# Patient Record
Sex: Female | Born: 1940
Health system: Southern US, Community
[De-identification: ages and names within clinical notes are randomized; demographics above are authoritative.]

## PROBLEM LIST (undated history)

## (undated) DIAGNOSIS — I10 Essential (primary) hypertension: Secondary | ICD-10-CM

## (undated) DIAGNOSIS — Z973 Presence of spectacles and contact lenses: Secondary | ICD-10-CM

## (undated) DIAGNOSIS — T7840XA Allergy, unspecified, initial encounter: Secondary | ICD-10-CM

## (undated) DIAGNOSIS — R7303 Prediabetes: Secondary | ICD-10-CM

## (undated) DIAGNOSIS — C50919 Malignant neoplasm of unspecified site of unspecified female breast: Secondary | ICD-10-CM

## (undated) DIAGNOSIS — N189 Chronic kidney disease, unspecified: Secondary | ICD-10-CM

## (undated) DIAGNOSIS — Z923 Personal history of irradiation: Secondary | ICD-10-CM

## (undated) DIAGNOSIS — E860 Dehydration: Secondary | ICD-10-CM

## (undated) HISTORY — PX: COLON SURGERY: SHX602

## (undated) HISTORY — PX: CHOLECYSTECTOMY: SHX55

## (undated) HISTORY — PX: PARTIAL COLECTOMY: SHX5273

## (undated) HISTORY — PX: ABDOMINAL HYSTERECTOMY: SHX81

---

## 1999-06-17 ENCOUNTER — Encounter: Admission: RE | Admit: 1999-06-17 | Discharge: 1999-06-17 | Payer: Self-pay | Admitting: *Deleted

## 1999-06-17 ENCOUNTER — Encounter: Payer: Self-pay | Admitting: *Deleted

## 1999-12-08 ENCOUNTER — Ambulatory Visit (HOSPITAL_COMMUNITY): Admission: RE | Admit: 1999-12-08 | Discharge: 1999-12-08 | Payer: Self-pay | Admitting: Gastroenterology

## 2000-06-17 ENCOUNTER — Encounter: Admission: RE | Admit: 2000-06-17 | Discharge: 2000-06-17 | Payer: Self-pay | Admitting: *Deleted

## 2000-06-17 ENCOUNTER — Encounter: Payer: Self-pay | Admitting: *Deleted

## 2001-02-04 ENCOUNTER — Encounter: Admission: RE | Admit: 2001-02-04 | Discharge: 2001-05-05 | Payer: Self-pay | Admitting: *Deleted

## 2001-06-20 ENCOUNTER — Encounter: Admission: RE | Admit: 2001-06-20 | Discharge: 2001-06-20 | Payer: Self-pay | Admitting: *Deleted

## 2001-06-20 ENCOUNTER — Encounter: Payer: Self-pay | Admitting: *Deleted

## 2002-06-23 ENCOUNTER — Encounter: Payer: Self-pay | Admitting: *Deleted

## 2002-06-23 ENCOUNTER — Encounter: Admission: RE | Admit: 2002-06-23 | Discharge: 2002-06-23 | Payer: Self-pay | Admitting: *Deleted

## 2003-06-27 ENCOUNTER — Encounter: Admission: RE | Admit: 2003-06-27 | Discharge: 2003-06-27 | Payer: Self-pay | Admitting: *Deleted

## 2004-03-16 ENCOUNTER — Inpatient Hospital Stay (HOSPITAL_COMMUNITY): Admission: EM | Admit: 2004-03-16 | Discharge: 2004-03-21 | Payer: Self-pay | Admitting: Emergency Medicine

## 2004-04-15 ENCOUNTER — Encounter: Admission: RE | Admit: 2004-04-15 | Discharge: 2004-04-15 | Payer: Self-pay | Admitting: Surgery

## 2004-05-19 ENCOUNTER — Encounter: Admission: RE | Admit: 2004-05-19 | Discharge: 2004-05-19 | Payer: Self-pay | Admitting: Otolaryngology

## 2004-07-02 ENCOUNTER — Encounter: Admission: RE | Admit: 2004-07-02 | Discharge: 2004-07-02 | Payer: Self-pay | Admitting: *Deleted

## 2004-07-14 ENCOUNTER — Encounter: Admission: RE | Admit: 2004-07-14 | Discharge: 2004-07-14 | Payer: Self-pay | Admitting: *Deleted

## 2004-07-24 ENCOUNTER — Inpatient Hospital Stay (HOSPITAL_COMMUNITY): Admission: RE | Admit: 2004-07-24 | Discharge: 2004-08-01 | Payer: Self-pay | Admitting: Surgery

## 2004-07-24 ENCOUNTER — Encounter (INDEPENDENT_AMBULATORY_CARE_PROVIDER_SITE_OTHER): Payer: Self-pay | Admitting: *Deleted

## 2004-12-31 ENCOUNTER — Encounter: Admission: RE | Admit: 2004-12-31 | Discharge: 2004-12-31 | Payer: Self-pay | Admitting: *Deleted

## 2005-08-20 ENCOUNTER — Encounter: Admission: RE | Admit: 2005-08-20 | Discharge: 2005-08-20 | Payer: Self-pay | Admitting: *Deleted

## 2006-08-04 ENCOUNTER — Encounter: Admission: RE | Admit: 2006-08-04 | Discharge: 2006-08-04 | Payer: Self-pay | Admitting: *Deleted

## 2007-08-10 ENCOUNTER — Encounter: Admission: RE | Admit: 2007-08-10 | Discharge: 2007-08-10 | Payer: Self-pay | Admitting: Family Medicine

## 2007-08-17 ENCOUNTER — Encounter: Admission: RE | Admit: 2007-08-17 | Discharge: 2007-08-17 | Payer: Self-pay | Admitting: Family Medicine

## 2008-04-25 ENCOUNTER — Other Ambulatory Visit: Admission: RE | Admit: 2008-04-25 | Discharge: 2008-04-25 | Payer: Self-pay | Admitting: Family Medicine

## 2008-08-31 ENCOUNTER — Encounter: Admission: RE | Admit: 2008-08-31 | Discharge: 2008-08-31 | Payer: Self-pay | Admitting: Sports Medicine

## 2009-09-04 ENCOUNTER — Encounter: Admission: RE | Admit: 2009-09-04 | Discharge: 2009-09-04 | Payer: Self-pay | Admitting: Family Medicine

## 2010-07-20 ENCOUNTER — Encounter: Payer: Self-pay | Admitting: Family Medicine

## 2010-09-02 ENCOUNTER — Other Ambulatory Visit: Payer: Self-pay | Admitting: Family Medicine

## 2010-09-02 DIAGNOSIS — Z1231 Encounter for screening mammogram for malignant neoplasm of breast: Secondary | ICD-10-CM

## 2010-09-24 ENCOUNTER — Ambulatory Visit
Admission: RE | Admit: 2010-09-24 | Discharge: 2010-09-24 | Disposition: A | Discharge status: Home / Self Care | Payer: Medicare Other | Source: Ambulatory Visit | Attending: Family Medicine | Admitting: Family Medicine

## 2010-09-24 DIAGNOSIS — Z1231 Encounter for screening mammogram for malignant neoplasm of breast: Secondary | ICD-10-CM

## 2010-09-26 ENCOUNTER — Other Ambulatory Visit: Payer: Self-pay | Admitting: Family Medicine

## 2010-09-26 DIAGNOSIS — R928 Other abnormal and inconclusive findings on diagnostic imaging of breast: Secondary | ICD-10-CM

## 2010-10-01 ENCOUNTER — Ambulatory Visit
Admission: RE | Admit: 2010-10-01 | Discharge: 2010-10-01 | Disposition: A | Discharge status: Home / Self Care | Payer: Medicare Other | Source: Ambulatory Visit | Attending: Family Medicine | Admitting: Family Medicine

## 2010-10-01 DIAGNOSIS — R928 Other abnormal and inconclusive findings on diagnostic imaging of breast: Secondary | ICD-10-CM

## 2010-11-14 NOTE — Discharge Summary (Signed)
NAMEWAVE, CALZADA                 ACCOUNT NO.:  [id number]   MEDICAL RECORD NO.:  [id number]          PATIENT TYPE:  INP   LOCATION:  0380                         FACILITY:  [facility]   PHYSICIAN:  Sandria Bales. Ezzard Standing, M.D.  DATE OF BIRTH:  21-Nov-1940   DATE OF ADMISSION:  07/24/2004  DATE OF DISCHARGE:  08/01/2004                                 DISCHARGE SUMMARY   DISCHARGE DIAGNOSES:  1.  Diverticular mass/stricture of sigmoid colon.  2.  Chronic cholecystitis with cholelithiasis.  3.  Hypertension.  4.  Atrophic right kidney.  5.  Degenerative joint disease.  6.  Gastroesophageal reflux disease.   OPERATIONS PERFORMED:  She had a left ureteral stent placed by Dr. Darvin Neighbours  on July 24, 2004 and had a cholecystectomy and intraoperative  cholangiogram and sigmoid colectomy by Dr. Ezzard Standing on July 24, 2004.   HISTORY OF ILLNESS:  This is a 70 year old black female, patient of Dr. Nadine Counts  ___________, who has been seen by Dr. Dorena Cookey, who has a history of  sigmoid diverticular abscess which was treated with a percutaneous drain in  September 2004.  She has now come for admission for a sigmoid colectomy.  I  did a barium enema on May 19, 2004.  This revealed spasm and multiple  diverticular rectosigmoid colon, but no other obvious mass.   PAST MEDICAL HISTORY:  1.  Hypertension.  2.  Atrophic right kidney, for this reason I contacted Dr. Earlene Plater who placed      a left ureteral stent at the time of surgery.  3.  Degenerative joint disease.  4.  Gastroesophageal reflux disease.   HOSPITAL COURSE:  Completed mechanical and antibiotic bowel prep at home  before presenting to the hospital on the day of admission.  She had a left  ureteral stent placed by Dr. Darvin Neighbours and then I did a laparoscopic  cholecystectomy with intraoperative cholangiogram and then an open sigmoid  colectomy.   Postoperatively, she did well.  On the first postoperative day her  hemoglobin was 10.6,  hematocrit 31, white blood count of 10,100.  Sodium of  132, potassium 3.9, chloride of 104.  Her BUN is 9, creatinine 0.9.   Her NG tube was removed on the second postoperative day.  She was kept NPO  since she started passing flatus which was on the fifth postoperative day.  She was started on clear liquids on the sixth postoperative day and her diet  had been advanced to a regular diet.  She is now eight days postoperative.  She is afebrile.  She is eating a regular diet, but she is having some loose  stools.   She is ready for discharge.  Her final pathology showed chronic  cholecystitis with cholelithiasis in the gallbladder and extensive  diverticulosis with focal marked diverticulitis in the sigmoid colon.  There  is no evidence of malignancy.   DISCHARGE INSTRUCTIONS:  Resume home medications which included Lotrel 5/20  one tablet daily, furosemide 40 mg daily, and Nexium 40 mg daily.  She is  given Vicodin for pain.  She will see me back in two weeks.  Her staples  will be removed on discharge with the wound steri-stripped.  She is to call  for any interval problems.  Her discharge condition is good.      DHN/MEDQ  D:  08/01/2004  T:  08/01/2004  Job:  [id number]   cc:   Clearnce Hasten, M.D.   John C. Madilyn Fireman, M.D.  [address]  [place]  [place] [address]  Fax: [phone]   Lucrezia Starch. Ovidio Hanger, M.D.  [address]  [place]  [place] [address]  Fax: [phone]

## 2010-11-14 NOTE — H&P (Signed)
[name], [name]                           ACCOUNT NO.:  [id number]   MEDICAL RECORD NO.:  [id number]                   PATIENT TYPE:  INP   LOCATION:  0482                                 FACILITY:  [facility]   PHYSICIAN:  Candyce Churn, M.D.          DATE OF BIRTH:  06-Dec-1940   DATE OF ADMISSION:  03/16/2004  DATE OF DISCHARGE:                                HISTORY & PHYSICAL   CHIEF COMPLAINT:  Abdominal pain.   HISTORY OF PRESENT ILLNESS:  Ms. [name] is a very pleasant 70 year old  female with a history of (1) hypertension, (2) obesity, (3) atrophic right  kidney, (4) DJD of the left knee, (5) hiatal hernia/GERD, (6) colon polyps,  (7) status post total abdominal hysterectomy.  She presents with 2 days of  abdominal pain both left and right lower quadrant and on CT she has two 2-3  cm abscesses on her sigmoid colon.  She is admitted now for IV antibiotic  therapy.  Dr. Ovidio Kin of surgery has consulted.   MEDICINES INCLUDE:  1.  Lotrel 5/10 one daily.  2.  Lasix 40 mg a day.  3.  Baby aspirin one daily.  4.  Vicodin 5/500 which she takes occasionally for knee pain.   FAMILY HISTORY:  Family history is significant for hypertension and  leukemia; her mother died of old age and her father is still living.   SOCIAL HISTORY:  No tobacco or alcohol use.  Has lived in [place] for the  last 9 years and originally from [place], [place].   REVIEW OF SYSTEMS:  Denies chest pain, headache, dysuria.  She does have  lower abdominal pain.   PHYSICAL EXAMINATION:  GENERAL:  Obese female who is not in any current  distress.  VITAL SIGNS:  Temperature is 98 degrees, blood pressure 135/92, pulse 88 and  regular, respiratory rate is 18 and nonlabored.  HEENT:  Normocephalic.  Pupils are equal and reactive.  Oropharynx is moist.  NECK:  Supple without JVD.  No thyromegaly.  CHEST:  Clear to auscultation.  CARDIAC:  Regular rhythm.  No murmurs or gallops.  No  rubs.  ABDOMEN:  Soft, tender in both lower quadrants, she has got some mild  rebound.  No distention.  Bowel sounds are decreased.  EXTREMITIES:  Without edema.  No cyanosis.  They are warm distally.  Pulses  intact distally.  NEUROLOGIC:  Essentially nonfocal.  She is oriented x3.  SKIN:  She has no rashes.   LABORATORY DATA:  White count 13,400, hemoglobin 12.6, platelet count  366,000, 87% segmented neutrophils on differential.  Sodium 137, potassium  4.2, chloride 103, bicarb 26, BUN 17, creatinine 1.2, glucose is 105.  LFTs  are normal.  Urinalysis is normal.   CT of the abdomen reveals two small abscesses on the sigmoid colon, she has  a gallstone, she has an atrophic right kidney, otherwise benign.  ASSESSMENT:  [age]-year-old female with diverticular abscesses that we  will admit for IV antibiotic  therapy.   PLAN:  1.  IV Cipro and Flagyl.  Keep n.p.o. for now except for ice chips.  2.  Hypertension.  Continue Lotrel and hold aspirin.  3.  Plan percutaneous drainage of the abscesses via CT tomorrow.      RNG/MEDQ  D:  03/16/2004  T:  03/17/2004  Job:  [id number]   cc:   Al Decant. Janey Greaser, MD  [address]  [place]  [place] [address]  Fax: [phone]   Everardo All. Madilyn Fireman, M.D.  [address]  [place]  [place] [address]  Fax: [phone]   Harvie Junior, M.D.  [address]  [place]  [place] [address]  Fax: [phone]

## 2010-11-14 NOTE — Op Note (Signed)
NAMEETOY, MCDONNELL                 ACCOUNT NO.:  [id number]   MEDICAL RECORD NO.:  [id number]          PATIENT TYPE:  INP   LOCATION:  0004                         FACILITY:  [facility]   PHYSICIAN:  Lucrezia Starch. Ovidio Hanger, M.D.DATE OF BIRTH:  03/23/1941   DATE OF PROCEDURE:  07/24/2004  DATE OF DISCHARGE:                                 OPERATIVE REPORT   PREOPERATIVE DIAGNOSES:  1.  Diverticular abscess, healed, now undergoing sigmoid resection by Dr.      Ovidio Kin along with cholecystectomy.  2.  Atrophic right kidney.   POSTOPERATIVE DIAGNOSES:  1.  Diverticular abscess, healed, now undergoing sigmoid resection by Dr.      Ovidio Kin along with cholecystectomy.  2.  Atrophic right kidney.   PROCEDURE:  Cystourethroscopy, left retrograde ureteral pyelogram, placement  of left whistle tip catheter to facilitate surgery.   SURGEON:  Lucrezia Starch. Earlene Plater, M.D.   ANESTHESIA:  General endotracheal.   ESTIMATED BLOOD LOSS:  Negligible.   TUBES:  5 French whistle tip stent Foley catheter.   COMPLICATIONS:  None.   INDICATIONS:  Ms. Jobst is a lovely 70 year old white female who presented  with a diverticular abscess drain.  She is now for a sigmoid colon resection  along with a cholecystectomy.  She has an atrophic right kidney and  essentially solitary left kidney.  Dr. Ezzard Standing felt for resection purposes  especially with the abscess that placement of a stent in the left ureter  preoperatively was indicated.  Understanding the risks, benefits and  alternatives she has elected to proceed.   DESCRIPTION OF PROCEDURE:  The patient was placed in the supine position.  After the proper general endotracheal anesthesia, was placed in the dorsal  lithotomy position and prepped and draped with Betadine in a sterile  fashion.  Cystourethroscopy was performed with a 22.5 [place] Olympus  panendoscope.  It may be noted her urethra was quite tight and urethral  dilatation was performed to 30  [place].  The bladder was inspected with 12  and 70 degree lenses.  Efflux of clear urine was noted from the normally  placed ureteral orifices bilaterally, and there were no lesions in the  bladder and the urethra.  Under fluoroscopic guidance, a 5 French whistle  tip stent was placed in the left renal pelvis.  Dye was injected for  retrograde ureteral pyelogram.  It was a totally normal retrograde ureteral  pyelogram without hydronephrosis, and the stent was passed into the pelvis.  An 22 French Foley catheter  was passed.  The stent was tied to the catheter with 0 vessel sutures and  placed through a Goldberg device into a drainage bag.  More dye was  injected, and on fluoroscopy was noted to be in good condition.  The case  was turned over to [employer] at that point.      RLD/MEDQ  D:  07/24/2004  T:  07/24/2004  Job:  [id number]

## 2010-11-14 NOTE — Discharge Summary (Signed)
NAMEKRISTAN, [name]                 ACCOUNT NO.:  [id number]   MEDICAL RECORD NO.:  [id number]          PATIENT TYPE:  INP   LOCATION:  0482                         FACILITY:  [facility]   PHYSICIAN:  Corinna L. Lendell Caprice, MDDATE OF BIRTH:  03/12/1941   DATE OF ADMISSION:  03/16/2004  DATE OF DISCHARGE:  03/21/2004                                 DISCHARGE SUMMARY   DIAGNOSES:  1.  Diverticular abscess, status post CT-guided drainage.  2.  Hypertension.  3.  Atrophic right kidney.  4.  Degenerative joint disease.  5.  Gastroesophageal reflux disease.   DISCHARGE MEDICATIONS:  1.  Ciprofloxacin 500 mg p.o. b.i.d. x 6 more days.  2.  Flagyl 500 mg p.o. t.i.d. x 6 more days.  3.  She may take Tylenol or Vicodin p.r.n. pain.  4.  Continue her outpatient medications which include:  Lasix 40 mg daily.  5.  Lotrel 5/10 mg daily.  6.  Baby aspirin daily.   ACTIVITY:  Ad lib.   DIET:  No seeds.   FOLLOW UP:  With Dr. Ezzard Standing in 2 weeks.   CONDITION AT DISCHARGE:  Stable.   PROCEDURES:  CT-guided drainage of diverticular abscess.   CONSULTATIONS:  Dr. Ezzard Standing   PERTINENT LABORATORY DATA:  Culture of the abscess grew out moderate E. coli  which was pansensitive.  White count on admission was 13,000 with 87%  neutrophils, 7% lymphocytes.  Otherwise, unremarkable.  At discharge, her  white count had decreased to within normal limits.  H&H was stable.  Complete metabolic panel essentially unremarkable.  UA essentially negative.   SPECIAL STUDIES AND RADIOLOGY:  CT of the abdomen and pelvis showed sigmoid  diverticulitis with two abscesses adjacent, measuring 27 x 24 mm and 17 x 24  mm.  No free air.   HISTORY AND HOSPITAL COURSE:  [name] is a pleasant 70 year old white  female patient of Dr. Doran Clay, who presented to the emergency room  with severe abdominal pain.  Her temperature was normal, and she had  otherwise normal vital signs.  She had a soft abdomen, was tender in both  lower quadrants with some mild rebound tenderness, diminished bowel sounds.  She was found to have a diverticular abscess.  Surgery was  consulted and recommended CT-guided drainage which occurred with  interventional radiology.  The patient was started on Cipro and Flagyl IV,  made NPO.  She was given pain medications and antiemetics as needed.  Her  pain and fevers subsided and, at the time of discharge, she was tolerating a  diet, afebrile, and had no tenderness on exam.     Cori   CLS/MEDQ  D:  03/21/2004  T:  03/22/2004  Job:  [id number]   cc:   Al Decant. Janey Greaser, MD  [address]  [place]  [place] [address]  Fax: [phone]   Sandria Bales. Ezzard Standing, M.D.  [address]  [place]  [place] [address]  Fax: [phone]

## 2010-11-14 NOTE — Consult Note (Signed)
NAMECECILLE, [name]                 ACCOUNT NO.:  [id number]   MEDICAL RECORD NO.:  [id number]          PATIENT TYPE:  EMS   LOCATION:  ED                           FACILITY:  [facility]   PHYSICIAN:  Sandria Bales. Ezzard Standing, M.D.  DATE OF BIRTH:  04/04/1941   DATE OF CONSULTATION:  03/16/2004  DATE OF DISCHARGE:                                   CONSULTATION   GENERAL SURGERY CONSULTATION:   HISTORY OF PRESENT ILLNESS:  This is a 70 year old black female, this is a  patient of Dr. Karie Chimera, who was doing fairly well until Friday,  March 14, 2004 when she had increasing lower abdominal pain.  Because of  the severe pain, she went to the [facility] today.  They saw a  physician whose name they cannot remember, she was then sent to [facility]  Emergency Room, at [facility] Emergency Room they obtained a CT scan which  showed diverticular abscess of the sigmoid colon and I was consulted.   PAST MEDICAL HISTORY:  The patient has no prior history of peptic ulcer  disease, liver disease, colon disease.  She apparently had a bout with  diverticular disease about 1996 but that was a single event treated as an  outpatient without hospitalization.  She said she had a negative colonoscopy  by Dr. Dorena Cookey in about 2003.  Her only prior abdominal surgery was a  hysterectomy in 1975.   ALLERGIES:  She has no allergies.   CURRENT MEDICATIONS:  Her current medications include Lotrel, Lasix, baby  aspirin.   REVIEW OF SYSTEMS:  NEUROLOGIC:  No seizure or loss of consciousness.  PULMONARY:  Does not smoke cigarettes.  No history of pneumonia or  tuberculosis.  CARDIAC:  She has been hypertensive for approximately 9-10  years but she thinks it is well controlled on her blood pressure medicine.  She never had a heart attack or chest pain.  GASTROINTESTINAL:  See history of present illness.  UROLOGIC:  She knows she has one kidney which does not function or does not  appear normal.  She  says she was told this in [place] many years ago.  The  reason for it is unclear.  She has never had any other kidney problems such  as stones or infections.  GYNECOLOGIC:  She had a hysterectomy for __________  reasons in 1975.  She  states they were fibroid tumors.  She has two children.  Her daughter  [name] is in the room with her.  She works part time for the [employer] on [address].  Her husband is traveling back from the [employer].  MUSCULOSKELETAL:  She saw Dr. Jodi Geralds after straining her left knee, had  an injection just 2 weeks ago and she is on some pain medicines for this.   PHYSICAL EXAMINATION:  VITAL SIGNS:  Her temperature is 98, blood pressure  135/92, pulse 88, respirations 16.  GENERAL:  She is a well-nourished pleasant black female, alert and  cooperative.  HEENT:  Unremarkable.  NECK:  Supple, she has no mass,  no thyromegaly.  LUNGS:  Clear to auscultation with symmetric breath sounds.  HEART:  Her heart has a regular rate and rhythm without murmur or rub.  ABDOMEN:  She has some mild to moderate lower suprapubic tenderness.  She  has no guarding, no rebound.  I feel no abdominal masses.  She is a little  bit on the obese side.  RECTAL:  I did not do a rectal exam on her.  EXTREMITIES:  She has good strength in the upper and lower extremities.  NEUROLOGIC:  Grossly intact.   LABORATORIES:  Labs that I have show a white blood count of 13,400,  hemoglobin 12.6, hematocrit 38.5, platelet count 366,000.  Her sodium is  137, potassium 4.2, chloride of 103, CO2 of 26, glucose of 105, BUN of 17,  creatinine of 1.2.   I reviewed a CT scan with Anselmo Pickler.  The CT/chest x-ray show four  things; (1) she has a moderate-sized hiatal hernia, (2) she has a large  gallstone with probably other gallbladder disease, (3) she has an atrophic  right kidney, (4) she has a sigmoid colon diverticulitis with a diverticular  abscess about 2-3 cm in size actually  kind of dumbbell shaped two of them  side by side with an air-fluid level, she has no pneumoperitoneum or diffuse  infection.   IMPRESSION:  1.  Diverticulitis with abscess.   1.  Gallstone   PLAN:  IV antibiotics, n.p.o., she is being seen by Dr. Johnella Moloney who is  the [facility] covering for Dr. Janey Greaser and we will follow with them.  Plan either aspiration or catheter drainage tomorrow in radiology.  Hopefully we can resolve this abscess and would elect 6-12 weeks from now an  elective sigmoid colectomy with primary anastomosis if she needs emergent  surgery.  She understands she may very well end up with a colostomy.  This  is explained to the patient and her daughter.   Second, she has a large gallstone, certainly at risk for impaction and if  she comes to elective surgery I would plan to do her cholecystectomy at the  same time probably.   1.  she has atrophic right kidney for unknown reasons probably very remote.   1.  There is that of a large hiatal hernia which has been asymptomatic.   1.  hypertension.   1.  recent left knee injury for which she is doing pretty well.     Davi   DHN/MEDQ  D:  03/16/2004  T:  03/16/2004  Job:  [id number]   cc:   Al Decant. Janey Greaser, MD  [address]  [place]  [place] [address]  Fax: [phone]   Candyce Churn, M.D.  [address] [place]  [place] [address]  Fax: [phone]   Harvie Junior, M.D.  [address]  [place]  [place] [address]  Fax: [phone]   Everardo All. Madilyn Fireman, M.D.  [address]  [place]  [place] [address]  Fax: [phone]

## 2010-11-14 NOTE — Op Note (Signed)
[name], [name]                 ACCOUNT NO.:  [id number]   MEDICAL RECORD NO.:  [id number]          PATIENT TYPE:  INP   LOCATION:  0004                         FACILITY:  [facility]   PHYSICIAN:  Sandria Bales. Ezzard Standing, M.D.  DATE OF BIRTH:  December 04, 1940   DATE OF PROCEDURE:  07/24/2004  DATE OF DISCHARGE:                                 OPERATIVE REPORT   PREOPERATIVE DIAGNOSIS:  History of diverticular abscess of sigmoid colon,  chronic cholecystitis and cholelithiasis, atrophic right kidney.   POSTOPERATIVE DIAGNOSIS:  History of diverticular abscess with inflammatory  changes of the distal sigmoid colon, chronic cholecystitis and  cholelithiasis, atrophic right kidney.   OPERATION PERFORMED:  1.  Left ureteral stent placement by Windy Fast L. Earlene Plater, M.D.  He will dictate      this portion of the operation.  2.  Laparoscopic cholecystectomy with intraoperative cholangiogram and      sigmoid colectomy.   SURGEON:  Sandria Bales. Ezzard Standing, M.D.   ASSISTANT:  Angelia Mould. Derrell Lolling, M.D.   ANESTHESIA:  General endotracheal.   ESTIMATED BLOOD LOSS:  350 mL.   DRAINS:  None.   INDICATIONS FOR PROCEDURE:  Ms. Valdivia is a 70 year old black female who was  hospitalized in September of 2005 with a diverticular abscess which was  treated with percutaneous drainage.   The patient by CT scan was also noted to have gallstones at that time.  She  now comes for attempted sigmoid colectomy and cholecystectomy.  She also has  an atrophic right kidney.  Since her renal function is dependent on one  kidney, I asked Dr. Darvin Neighbours to see her about placing a stent  preoperatively.  I was not sure exactly how much of the inflammatory  component would remain with the sigmoid colon.   The indications and potential complications of both the cholecystectomy and  colectomy were explained to the patient.  Potential complications include  but not limited to bleeding, infection, bile duct injury, bowel injury,  leak,  possibility of hernia.   DESCRIPTION OF PROCEDURE:  The patient was placed in a supine position after  Dr. Earlene Plater completed his left ureteral stent placement in the lithotomy  position.  Her abdomen was prepped with Betadine solution and sterilely  draped.  I first did the gallbladder surgery.   I went through an infraumbilical incision and placed a 10 mm Hasson trocar.  Three trocars were placed.  A 10 mm subxiphoid trocar, a 5 mm right mid  subcostal, and a 5 mm lateral subcostal trocar.  The gallbladder was grasped  and rotated cephalad.  There was noted to be quite extensive adhesions  between almost the whole body of the gallbladder to tissues lateral to the  duodenum and actually attached to the duodenum.  These were taken down  sharply and bluntly.  There was no obvious injury to either the duodenum or  the paraduodenal area.   The cystic duct and cystic artery were then both identified and the cystic  artery triply endoclipped and divided.  The cystic duct a single clip placed  across it and  then I shot an intraoperative cholangiogram.  The  intraoperative cholangiogram was shot using a cut off taut catheter inserted  through a 14 gauge Jelco into the abdominal cavity into the site of the cut  cystic duct.  Intraoperative cholangiogram was shot using 6 mL of half  strength Hypaque solution.  This showed free flow of contrast down the  cystic duct, into the common bile duct into the duodenum and back up the  hepatic radicals.  This was felt to be a normal intraoperative  cholangiogram.   The taut catheter was then removed.  The cystic duct triply endoclipped and  divided.  The gallbladder was sharply and bluntly dissected from the  gallbladder bed using primarily the hook Bovie for coagulation.  At  completion of the dissection, the gallbladder bed was visualized and there  was no bleeding or bile leak.  The triangle of Calot was visualized.  There  was no bleeding or bile leak.   The gallbladder was then delivered through an  EndoCatch back and delivered through the umbilicus.  Again, I inspected the  gallbladder bed, dissection, there was no active bleeding.  Then I turned my  attention to the sigmoid colon.   A low midline incision was made with sharp dissection carried down to the  abdominal cavity.  I was a little hesitant to do this laparoscopically for  two reasons.  (1) She did have a single ureter which I really had to make  sure was out of the way.  (2)  She had an abscess which was a pretty good-  sized abscess which had been percutaneously drained.  I was unclear a little  bit about how things would be stuck.   So I went through a low midline incision, identified the sigmoid colon.  I  freed up the sigmoid colon with its reflection along the line of Toldt.  She  did have a fair amount of adhesions to her left ovary and fallopian tube but  her uterus was absent and this was freed up without incident after the  vessels had been identified.   Again, the patient had kind of a bulky, post inflammatory mass in the distal  one third of the sigmoid colon.  I got below this, immediately above the  peritoneal reflection where I thought I would do my anastomosis.  I went up  and divided the sigmoid colon at the mid sigmoid colon.  I took the  mesentery down excising the sigmoid mass.  I placed a stitch on the proximal  end  of the resected sigmoid colon and sent it to pathology.  Dr. Kieth Brightly called me back and said there was no obvious mucosal defect and it was  felt to be all inflammatory changes secondary to diverticulits.   I then carried out an anastomosis using an interrupted 2-0 silk suture in an  inverting fashion until I got to the anterior wall and I then did a Gambee  suture.  The anastomosis easily held my thumb if not wider.  There was no  bleeding from the anastomosis.  I closed the mesentery with a 2-0 Vicryl suture.  I irrigated the  abdomen out with about 4L of saline.  I then  returned the bowel to its normal location, retrieved the packs, placed the  omentum down in the lower pelvis.  I closed the anterior fascia with two  running #1 PDS sutures.   I irrigated.  I then closed the fascia  with two running #1 PDS sutures.  I  closed the skin with skin staples, placed Telfa wicks in the wound.  Her  stent was removed at the end of the procedure but I did leave the Foley  catheter in place.  She had a nasogastric tube in place.  The estimated  blood loss for this procedure was about 350 mL.   The patient tolerated the procedure well and was transported to the recovery  room in good condition.  Sponge and needle counts were correct at the end of  this case.      DHN/MEDQ  D:  07/24/2004  T:  07/24/2004  Job:  [id number]   cc:   Al Decant. Janey Greaser, MD  [address]  [place]  [place] [address]  Fax: [phone]

## 2010-11-14 NOTE — Op Note (Signed)
[facility]  Patient:    [name], FIALLOS                        MRN: [id number] Proc. Date: 12/08/99 Adm. Date:  [id number] Disc. Date: [id number] Attending:  Louie Bun CC:         Al Decant. Janey Greaser, M.D.                           Operative Report  PROCEDURE:  Colonoscopy.  INDICATIONS:  History of adenomatous colon polyps on initial colonoscopy three years ago, due for surveillance.  DESCRIPTION OF PROCEDURE:  The patient was placed in the left lateral decubitus  position and placed on the pulse monitor with continuous low flow oxygen delivered by nasal cannula.  She was sedated with 60 mg IV Demerol and 6 mg IV Versed. The Olympus video colonoscope was inserted into the rectum and advanced to the cecum, confirmed by transillumination of McBurneys point and visualization of the ileocecal valve and appendiceal orifice, as well as intubation of the terminal ileum which appeared normal.  The prep was generally good, but suboptimal in the ascending colon and cecum requiring a significant amount of water lavage and despite this, I could not conclusively rule out small lesions less than 1 cm in all areas there.  Otherwise, the cecum, ascending, transverse, and descending colon  appeared normal with no masses, polyps, diverticuli, or other mucosal abnormalities.  In the sigmoid colon, there were seen some scattered diverticuli and no other abnormalities.  The rectum appeared normal and retroflexed view of the anus did reveal some small internal hemorrhoids.  The colonoscope was then withdrawn and the patient returned to the recovery room in stable condition. She tolerated the procedure well and there were no immediate complications.  IMPRESSION: 1. Left-sided diverticulosis. 2. Internal hemorrhoids.  PLAN:  Repeat colonoscopy in five years based on her prior history of adenomas. DD:  12/08/99 TD:  12/10/99 Job: [id number]

## 2011-03-10 ENCOUNTER — Other Ambulatory Visit: Payer: Self-pay | Admitting: Family Medicine

## 2011-03-10 DIAGNOSIS — N63 Unspecified lump in unspecified breast: Secondary | ICD-10-CM

## 2011-04-03 ENCOUNTER — Ambulatory Visit
Admission: RE | Admit: 2011-04-03 | Discharge: 2011-04-03 | Disposition: A | Discharge status: Home / Self Care | Payer: Medicare Other | Source: Ambulatory Visit | Attending: Family Medicine | Admitting: Family Medicine

## 2011-04-03 ENCOUNTER — Other Ambulatory Visit: Payer: Self-pay | Admitting: Family Medicine

## 2011-04-03 DIAGNOSIS — N63 Unspecified lump in unspecified breast: Secondary | ICD-10-CM

## 2011-09-16 ENCOUNTER — Other Ambulatory Visit: Payer: Self-pay | Admitting: Family Medicine

## 2011-09-16 DIAGNOSIS — Z1231 Encounter for screening mammogram for malignant neoplasm of breast: Secondary | ICD-10-CM

## 2011-10-21 ENCOUNTER — Ambulatory Visit
Admission: RE | Admit: 2011-10-21 | Discharge: 2011-10-21 | Disposition: A | Discharge status: Home / Self Care | Payer: Medicare Other | Source: Ambulatory Visit | Attending: Family Medicine | Admitting: Family Medicine

## 2011-10-21 DIAGNOSIS — Z1231 Encounter for screening mammogram for malignant neoplasm of breast: Secondary | ICD-10-CM

## 2012-09-30 ENCOUNTER — Other Ambulatory Visit: Payer: Self-pay

## 2012-09-30 DIAGNOSIS — Z1231 Encounter for screening mammogram for malignant neoplasm of breast: Secondary | ICD-10-CM

## 2012-10-27 ENCOUNTER — Ambulatory Visit
Admission: RE | Admit: 2012-10-27 | Discharge: 2012-10-27 | Disposition: A | Discharge status: Home / Self Care | Payer: Medicare Other | Source: Ambulatory Visit

## 2012-10-27 DIAGNOSIS — Z1231 Encounter for screening mammogram for malignant neoplasm of breast: Secondary | ICD-10-CM

## 2013-07-20 ENCOUNTER — Other Ambulatory Visit (HOSPITAL_COMMUNITY): Payer: Self-pay | Admitting: Internal Medicine

## 2013-07-20 DIAGNOSIS — R7989 Other specified abnormal findings of blood chemistry: Secondary | ICD-10-CM

## 2013-08-08 ENCOUNTER — Encounter (HOSPITAL_COMMUNITY)
Admission: RE | Admit: 2013-08-08 | Discharge: 2013-08-08 | Disposition: A | Discharge status: Home / Self Care | Payer: Medicare Other | Source: Ambulatory Visit | Attending: Internal Medicine | Admitting: Internal Medicine

## 2013-08-08 DIAGNOSIS — R946 Abnormal results of thyroid function studies: Secondary | ICD-10-CM | POA: Insufficient documentation

## 2013-08-08 DIAGNOSIS — R7989 Other specified abnormal findings of blood chemistry: Secondary | ICD-10-CM

## 2013-08-09 ENCOUNTER — Ambulatory Visit (HOSPITAL_COMMUNITY)
Admission: RE | Admit: 2013-08-09 | Discharge: 2013-08-09 | Disposition: A | Discharge status: Home / Self Care | Payer: Medicare Other | Source: Ambulatory Visit | Attending: Internal Medicine | Admitting: Internal Medicine

## 2013-08-09 DIAGNOSIS — E349 Endocrine disorder, unspecified: Secondary | ICD-10-CM | POA: Insufficient documentation

## 2013-08-09 MED ORDER — SODIUM PERTECHNETATE TC 99M INJECTION
10.5000 | Freq: Once | INTRAVENOUS | Status: AC | PRN
  Administered 2013-08-09: 11 via INTRAVENOUS

## 2013-08-09 MED ORDER — SODIUM IODIDE I 131 CAPSULE
16.5000 | Freq: Once | INTRAVENOUS | Status: AC | PRN
  Administered 2013-08-09: 16.5 via ORAL

## 2013-10-23 ENCOUNTER — Other Ambulatory Visit: Payer: Self-pay

## 2013-10-23 DIAGNOSIS — Z1231 Encounter for screening mammogram for malignant neoplasm of breast: Secondary | ICD-10-CM

## 2013-11-09 ENCOUNTER — Ambulatory Visit
Admission: RE | Admit: 2013-11-09 | Discharge: 2013-11-09 | Disposition: A | Discharge status: Home / Self Care | Payer: Medicare Other | Source: Ambulatory Visit

## 2013-11-09 ENCOUNTER — Encounter (INDEPENDENT_AMBULATORY_CARE_PROVIDER_SITE_OTHER): Payer: Self-pay

## 2013-11-09 DIAGNOSIS — Z1231 Encounter for screening mammogram for malignant neoplasm of breast: Secondary | ICD-10-CM

## 2014-03-03 ENCOUNTER — Emergency Department (HOSPITAL_COMMUNITY): Payer: Medicare Other

## 2014-03-03 ENCOUNTER — Encounter (HOSPITAL_COMMUNITY): Payer: Self-pay | Admitting: Emergency Medicine

## 2014-03-03 ENCOUNTER — Emergency Department (HOSPITAL_COMMUNITY)
Admission: EM | Admit: 2014-03-03 | Discharge: 2014-03-03 | Disposition: A | Discharge status: Home / Self Care | Payer: Medicare Other | Attending: Emergency Medicine | Admitting: Emergency Medicine

## 2014-03-03 DIAGNOSIS — R0789 Other chest pain: Secondary | ICD-10-CM | POA: Insufficient documentation

## 2014-03-03 DIAGNOSIS — Z7982 Long term (current) use of aspirin: Secondary | ICD-10-CM | POA: Diagnosis not present

## 2014-03-03 DIAGNOSIS — Z79899 Other long term (current) drug therapy: Secondary | ICD-10-CM | POA: Insufficient documentation

## 2014-03-03 DIAGNOSIS — R079 Chest pain, unspecified: Secondary | ICD-10-CM | POA: Diagnosis present

## 2014-03-03 DIAGNOSIS — I10 Essential (primary) hypertension: Secondary | ICD-10-CM | POA: Diagnosis not present

## 2014-03-03 DIAGNOSIS — R1013 Epigastric pain: Secondary | ICD-10-CM | POA: Insufficient documentation

## 2014-03-03 HISTORY — DX: Essential (primary) hypertension: I10

## 2014-03-03 LAB — COMPREHENSIVE METABOLIC PANEL
ALT: 15 U/L (ref 0–35)
ANION GAP: 11 (ref 5–15)
AST: 20 U/L (ref 0–37)
Albumin: 3.2 g/dL — ABNORMAL LOW (ref 3.5–5.2)
Alkaline Phosphatase: 68 U/L (ref 39–117)
BILIRUBIN TOTAL: 0.3 mg/dL (ref 0.3–1.2)
BUN: 14 mg/dL (ref 6–23)
CHLORIDE: 103 meq/L (ref 96–112)
CO2: 26 meq/L (ref 19–32)
CREATININE: 1.19 mg/dL — AB (ref 0.50–1.10)
Calcium: 9.3 mg/dL (ref 8.4–10.5)
GFR, EST AFRICAN AMERICAN: 52 mL/min — AB (ref >90)
GFR, EST NON AFRICAN AMERICAN: 44 mL/min — AB (ref >90)
GLUCOSE: 113 mg/dL — AB (ref 70–99)
Potassium: 4 mEq/L (ref 3.7–5.3)
Sodium: 140 mEq/L (ref 137–147)
Total Protein: 7.1 g/dL (ref 6.0–8.3)

## 2014-03-03 LAB — CBC WITH DIFFERENTIAL/PLATELET
Basophils Absolute: 0 10*3/uL (ref 0.0–0.1)
Basophils Relative: 0 % (ref 0–1)
EOS PCT: 3 % (ref 0–5)
Eosinophils Absolute: 0.2 10*3/uL (ref 0.0–0.7)
HEMATOCRIT: 37.3 % (ref 36.0–46.0)
HEMOGLOBIN: 12.6 g/dL (ref 12.0–15.0)
LYMPHS ABS: 1.7 10*3/uL (ref 0.7–4.0)
LYMPHS PCT: 25 % (ref 12–46)
MCH: 29 pg (ref 26.0–34.0)
MCHC: 33.8 g/dL (ref 30.0–36.0)
MCV: 85.7 fL (ref 78.0–100.0)
MONO ABS: 0.6 10*3/uL (ref 0.1–1.0)
MONOS PCT: 8 % (ref 3–12)
NEUTROS ABS: 4.5 10*3/uL (ref 1.7–7.7)
Neutrophils Relative %: 64 % (ref 43–77)
Platelets: 340 10*3/uL (ref 150–400)
RBC: 4.35 MIL/uL (ref 3.87–5.11)
RDW: 14.1 % (ref 11.5–15.5)
WBC: 7 10*3/uL (ref 4.0–10.5)

## 2014-03-03 LAB — I-STAT TROPONIN, ED: Troponin i, poc: 0 ng/mL (ref 0.00–0.08)

## 2014-03-03 LAB — LIPASE, BLOOD: LIPASE: 118 U/L — AB (ref 11–59)

## 2014-03-03 MED ORDER — OMEPRAZOLE 20 MG PO CPDR: 20.0000 mg | DELAYED_RELEASE_CAPSULE | Freq: Two times a day (BID) | ORAL | Status: DC

## 2014-03-03 MED ORDER — MORPHINE SULFATE 4 MG/ML IJ SOLN
4.0000 mg | Freq: Once | INTRAMUSCULAR | Status: AC
  Administered 2014-03-03: 4 mg via INTRAVENOUS
  Filled 2014-03-03: qty 1

## 2014-03-03 MED ORDER — GI COCKTAIL ~~LOC~~
30.0000 mL | Freq: Once | ORAL | Status: AC
  Administered 2014-03-03: 30 mL via ORAL
  Filled 2014-03-03: qty 30

## 2014-03-03 MED ORDER — HYDROCODONE-ACETAMINOPHEN 5-325 MG PO TABS: 2.0000 | ORAL_TABLET | ORAL | Status: DC | PRN

## 2014-03-03 MED ORDER — IOHEXOL 300 MG/ML  SOLN
50.0000 mL | Freq: Once | INTRAMUSCULAR | Status: AC | PRN
  Administered 2014-03-03: 50 mL via ORAL

## 2014-03-03 MED ORDER — IOHEXOL 300 MG/ML  SOLN
100.0000 mL | Freq: Once | INTRAMUSCULAR | Status: AC | PRN
  Administered 2014-03-03: 100 mL via INTRAVENOUS

## 2014-03-03 NOTE — ED Provider Notes (Addendum)
CSN: [id number]     Arrival date & time 03/03/14  0035 History   First MD Initiated Contact with Patient 03/03/14 0050     Chief Complaint  Patient presents with  . Chest Pain     (Consider location/radiation/quality/duration/timing/severity/associated sxs/prior Treatment) HPI Comments: Patient is a 73 year old female with past medical history of hypertension and cholecystectomy. She presents today with complaints of sharp pains in her lower chest that started today at approximately 1:30. She describes these as sharp and stabbing and are worsened with movement, palpation, and position. It is relieved with remaining still. She denies any nausea, diaphoresis, shortness of breath, or radiation to the arm or talk. She denies any recent exertional symptoms. There is no relation to food.  Patient is a 73 y.o. female presenting with chest pain. The history is provided by the patient.  Chest Pain Pain location:  Substernal area Pain quality: sharp   Pain radiates to:  Does not radiate Pain radiates to the back: no   Pain severity:  Moderate Onset quality:  Sudden Duration:  12 hours Timing:  Constant Progression:  Worsening Chronicity:  New Context: breathing and movement   Relieved by: Remaining still. Worsened by:  Coughing, deep breathing, certain positions and movement (Palpation) Associated symptoms: no cough, no nausea and no shortness of breath     Past Medical History  Diagnosis Date  . Hypertension    Past Surgical History  Procedure Laterality Date  . Cholecystectomy    . Colon surgery    . Abdominal hysterectomy     No family history on file. History  Substance Use Topics  . Smoking status: Never Smoker   . Smokeless tobacco: Not on file  . Alcohol Use: No   OB History   Grav Para Term Preterm Abortions TAB SAB Ect Mult Living                 Review of Systems  Respiratory: Negative for cough and shortness of breath.   Cardiovascular: Positive for chest pain.   Gastrointestinal: Negative for nausea.  All other systems reviewed and are negative.     Allergies  Review of patient's allergies indicates no known allergies.  Home Medications   Prior to Admission medications   Medication Sig Start Date End Date Taking? Authorizing Provider  amLODipine-benazepril (LOTREL) 5-20 MG per capsule Take 1 capsule by mouth daily.   Yes Historical Provider, MD  aspirin EC 81 MG tablet Take 81 mg by mouth daily.   Yes Historical Provider, MD  furosemide (LASIX) 40 MG tablet Take 40 mg by mouth.   Yes Historical Provider, MD   BP 143/83  Pulse 73  Temp(Src) 98.4 F (36.9 C) (Oral)  Resp 19  SpO2 98% Physical Exam  Nursing note and vitals reviewed. Constitutional: She is oriented to person, place, and time. She appears well-developed and well-nourished. No distress.  HENT:  Head: Normocephalic and atraumatic.  Neck: Normal range of motion. Neck supple.  Cardiovascular: Normal rate and regular rhythm.  Exam reveals no gallop and no friction rub.   No murmur heard. Pulmonary/Chest: Effort normal and breath sounds normal. No respiratory distress. She has no wheezes. She exhibits tenderness.  There is exquisite tenderness of the lower sternum to palpation.  Abdominal: Soft. Bowel sounds are normal. She exhibits no distension. There is no tenderness.  Musculoskeletal: Normal range of motion. She exhibits no edema.  Neurological: She is alert and oriented to person, place, and time.  Skin: Skin is warm  and dry. She is not diaphoretic.    ED Course  Procedures (including critical care time) Labs Review Labs Reviewed  CBC WITH DIFFERENTIAL  COMPREHENSIVE METABOLIC PANEL  LIPASE, BLOOD  I-STAT [place], ED    Imaging Review No results found.   EKG Interpretation   Date/Time:  Saturday March 03 2014 00:48:21 EDT Ventricular Rate:  72 PR Interval:  161 QRS Duration: 82 QT Interval:  465 QTC Calculation: 509 R Axis:   65 Text  Interpretation:  Sinus rhythm Borderline T abnormalities, diffuse  leads Prolonged QT interval Baseline wander in lead(s) II III aVL aVF V6  Confirmed by DELOS  MD, Max Nuno ([id number]) on 03/03/2014 12:59:55 AM      MDM   Final diagnoses:  None    Patient presents here with complaints of epigastric and lower chest discomfort. This is been going on for approximately 12 hours. Workup reveals no change in EKG and her troponin is unremarkable. Laboratory studies do reveal a mildly elevated lipase of 118. This raises my concerns for pancreatitis and this result was followed up with a CT scan. This reveals a Bochdalek's hernia, however no evidence for pancreatitis. I am uncertain as to whether her pain is caused by the hernia or potentially related to pancreatitis that has not shown up on the CT scan as of yet. Either way, she has not had significant relief with morphine x2 and I feel as though admission for observation is appropriate. I strongly doubt a cardiac etiology as there is no elevation of troponin and no significant EKG findings.  I've spoken with Dr. Roel Cluck who will evaluate the patient in the ER.    Veryl Speak, MD 03/03/14 [phone]    Patient now saying that her pain is completely resolved and desires to go home. She will be given pain medication, started on Prilosec and advised to return if she develops any problems.  Veryl Speak, MD 03/03/14 [phone]

## 2014-03-03 NOTE — ED Notes (Signed)
Pt presents with CP under L breast onset @ 1330 today. Worse with movement, denies [facility], denies n/v/d. Pt A & O, NAD

## 2014-03-03 NOTE — Discharge Instructions (Signed)
Prilosec twice daily as prescribed. Hydrocodone as prescribed as needed for pain.  Return to the emergency department if you develop worsening pain, high fever, bloody stool or vomit, or other new and concerning symptoms.   Abdominal Pain, Women Abdominal (stomach, pelvic, or belly) pain can be caused by many things. It is important to tell your doctor:  The location of the pain.  Does it come and go or is it present all the time?  Are there things that start the pain (eating certain foods, exercise)?  Are there other symptoms associated with the pain (fever, nausea, vomiting, diarrhea)? All of this is helpful to know when trying to find the cause of the pain. CAUSES   Stomach: virus or bacteria infection, or ulcer.  Intestine: appendicitis (inflamed appendix), regional ileitis (Crohn's disease), ulcerative colitis (inflamed colon), irritable bowel syndrome, diverticulitis (inflamed diverticulum of the colon), or cancer of the stomach or intestine.  Gallbladder disease or stones in the gallbladder.  Kidney disease, kidney stones, or infection.  Pancreas infection or cancer.  Fibromyalgia (pain disorder).  Diseases of the female organs:  Uterus: fibroid (non-cancerous) tumors or infection.  Fallopian tubes: infection or tubal pregnancy.  Ovary: cysts or tumors.  Pelvic adhesions (scar tissue).  Endometriosis (uterus lining tissue growing in the pelvis and on the pelvic organs).  Pelvic congestion syndrome (female organs filling up with blood just before the menstrual period).  Pain with the menstrual period.  Pain with ovulation (producing an egg).  Pain with an IUD (intrauterine device, birth control) in the uterus.  Cancer of the female organs.  Functional pain (pain not caused by a disease, may improve without treatment).  Psychological pain.  Depression. DIAGNOSIS  Your doctor will decide the seriousness of your pain by doing an examination.  Blood  tests.  X-rays.  Ultrasound.  CT scan (computed tomography, special type of X-ray).  MRI (magnetic resonance imaging).  Cultures, for infection.  Barium enema (dye inserted in the large intestine, to better view it with X-rays).  Colonoscopy (looking in intestine with a lighted tube).  Laparoscopy (minor surgery, looking in abdomen with a lighted tube).  Major abdominal exploratory surgery (looking in abdomen with a large incision). TREATMENT  The treatment will depend on the cause of the pain.   Many cases can be observed and treated at home.  Over-the-counter medicines recommended by your caregiver.  Prescription medicine.  Antibiotics, for infection.  Birth control pills, for painful periods or for ovulation pain.  Hormone treatment, for endometriosis.  Nerve blocking injections.  Physical therapy.  Antidepressants.  Counseling with a psychologist or psychiatrist.  Minor or major surgery. HOME CARE INSTRUCTIONS   Do not take laxatives, unless directed by your caregiver.  Take over-the-counter pain medicine only if ordered by your caregiver. Do not take aspirin because it can cause an upset stomach or bleeding.  Try a clear liquid diet (broth or water) as ordered by your caregiver. Slowly move to a bland diet, as tolerated, if the pain is related to the stomach or intestine.  Have a thermometer and take your temperature several times a day, and record it.  Bed rest and sleep, if it helps the pain.  Avoid sexual intercourse, if it causes pain.  Avoid stressful situations.  Keep your follow-up appointments and tests, as your caregiver orders.  If the pain does not go away with medicine or surgery, you may try:  Acupuncture.  Relaxation exercises (yoga, meditation).  Group therapy.  Counseling. [place]  IF:   You notice certain foods cause stomach pain.  Your home care treatment is not helping your pain.  You need stronger pain  medicine.  You want your IUD removed.  You feel faint or lightheaded.  You develop nausea and vomiting.  You develop a rash.  You are having side effects or an allergy to your medicine. SEEK IMMEDIATE MEDICAL CARE IF:   Your pain does not go away or gets worse.  You have a fever.  Your pain is felt only in portions of the abdomen. The right side could possibly be appendicitis. The left lower portion of the abdomen could be colitis or diverticulitis.  You are passing blood in your stools (bright red or black tarry stools, with or without vomiting).  You have blood in your urine.  You develop chills, with or without a fever.  You pass out. MAKE SURE YOU:   Understand these instructions.  Will watch your condition.  Will get help right away if you are not doing well or get worse. Document Released: 04/12/2007 Document Revised: 10/30/2013 Document Reviewed: 05/02/2009 [facility] Patient Information 2015 [place], [place]. This information is not intended to replace advice given to you by your health care provider. Make sure you discuss any questions you have with your health care provider.  Chest Pain (Nonspecific) It is often hard to give a specific diagnosis for the cause of chest pain. There is always a chance that your pain could be related to something serious, such as a heart attack or a blood clot in the lungs. You need to follow up with your health care provider for further evaluation. CAUSES   Heartburn.  Pneumonia or bronchitis.  Anxiety or stress.  Inflammation around your heart (pericarditis) or lung (pleuritis or pleurisy).  A blood clot in the lung.  A collapsed lung (pneumothorax). It can develop suddenly on its own (spontaneous pneumothorax) or from trauma to the chest.  Shingles infection (herpes zoster virus). The chest wall is composed of bones, muscles, and cartilage. Any of these can be the source of the pain.  The bones can be bruised by  injury.  The muscles or cartilage can be strained by coughing or overwork.  The cartilage can be affected by inflammation and become sore (costochondritis). DIAGNOSIS  Lab tests or other studies may be needed to find the cause of your pain. Your health care provider may have you take a test called an ambulatory electrocardiogram (ECG). An ECG records your heartbeat patterns over a 24-hour period. You may also have other tests, such as:  Transthoracic echocardiogram (TTE). During echocardiography, sound waves are used to evaluate how blood flows through your heart.  Transesophageal echocardiogram (TEE).  Cardiac monitoring. This allows your health care provider to monitor your heart rate and rhythm in real time.  Holter monitor. This is a portable device that records your heartbeat and can help diagnose heart arrhythmias. It allows your health care provider to track your heart activity for several days, if needed.  Stress tests by exercise or by giving medicine that makes the heart beat faster. TREATMENT   Treatment depends on what may be causing your chest pain. Treatment may include:  Acid blockers for heartburn.  Anti-inflammatory medicine.  Pain medicine for inflammatory conditions.  Antibiotics if an infection is present.  You may be advised to change lifestyle habits. This includes stopping smoking and avoiding alcohol, caffeine, and chocolate.  You may be advised to keep your head raised (elevated) when sleeping. This  reduces the chance of acid going backward from your stomach into your esophagus. Most of the time, nonspecific chest pain will improve within 2-3 days with rest and mild pain medicine.  HOME CARE INSTRUCTIONS   If antibiotics were prescribed, take them as directed. Finish them even if you start to feel better.  For the next few days, avoid physical activities that bring on chest pain. Continue physical activities as directed.  Do not use any tobacco  products, including cigarettes, chewing tobacco, or electronic cigarettes.  Avoid drinking alcohol.  Only take medicine as directed by your health care provider.  Follow your health care provider's suggestions for further testing if your chest pain does not go away.  Keep any follow-up appointments you made. If you do not go to an appointment, you could develop lasting (chronic) problems with pain. If there is any problem keeping an appointment, call to reschedule. SEEK MEDICAL CARE IF:   Your chest pain does not go away, even after treatment.  You have a rash with blisters on your chest.  You have a fever. SEEK IMMEDIATE MEDICAL CARE IF:   You have increased chest pain or pain that spreads to your arm, neck, jaw, back, or abdomen.  You have shortness of breath.  You have an increasing cough, or you cough up blood.  You have severe back or abdominal pain.  You feel nauseous or vomit.  You have severe weakness.  You faint.  You have chills. This is an emergency. Do not wait to see if the pain will go away. Get medical help at once. Call your local emergency services (911 in [place] Do not drive yourself to the hospital. MAKE SURE YOU:   Understand these instructions.  Will watch your condition.  Will get help right away if you are not doing well or get worse. Document Released: 03/25/2005 Document Revised: 06/20/2013 Document Reviewed: 01/19/2008 [facility] Patient Information 2015 [place], [place]. This information is not intended to replace advice given to you by your health care provider. Make sure you discuss any questions you have with your health care provider.

## 2014-03-03 NOTE — ED Notes (Signed)
EKG given to EDP,Delo,MD., for review. 

## 2014-04-13 ENCOUNTER — Other Ambulatory Visit: Payer: Self-pay

## 2014-10-02 ENCOUNTER — Emergency Department (HOSPITAL_COMMUNITY): Payer: Medicare Other

## 2014-10-02 ENCOUNTER — Inpatient Hospital Stay (HOSPITAL_COMMUNITY)
Admission: EM | Admit: 2014-10-02 | Discharge: 2014-10-09 | DRG: 327 | Disposition: A | Discharge status: Home / Self Care | Payer: Medicare Other | Attending: Surgery | Admitting: Surgery

## 2014-10-02 ENCOUNTER — Encounter (HOSPITAL_COMMUNITY): Payer: Self-pay

## 2014-10-02 DIAGNOSIS — K440 Diaphragmatic hernia with obstruction, without gangrene: Secondary | ICD-10-CM | POA: Diagnosis present

## 2014-10-02 DIAGNOSIS — N189 Chronic kidney disease, unspecified: Secondary | ICD-10-CM | POA: Diagnosis present

## 2014-10-02 DIAGNOSIS — Z6830 Body mass index (BMI) 30.0-30.9, adult: Secondary | ICD-10-CM | POA: Diagnosis not present

## 2014-10-02 DIAGNOSIS — Z9049 Acquired absence of other specified parts of digestive tract: Secondary | ICD-10-CM | POA: Diagnosis present

## 2014-10-02 DIAGNOSIS — Z79899 Other long term (current) drug therapy: Secondary | ICD-10-CM | POA: Diagnosis not present

## 2014-10-02 DIAGNOSIS — E860 Dehydration: Secondary | ICD-10-CM | POA: Diagnosis present

## 2014-10-02 DIAGNOSIS — R1033 Periumbilical pain: Secondary | ICD-10-CM | POA: Diagnosis present

## 2014-10-02 DIAGNOSIS — Z09 Encounter for follow-up examination after completed treatment for conditions other than malignant neoplasm: Secondary | ICD-10-CM

## 2014-10-02 DIAGNOSIS — Q790 Congenital diaphragmatic hernia: Secondary | ICD-10-CM

## 2014-10-02 DIAGNOSIS — N390 Urinary tract infection, site not specified: Secondary | ICD-10-CM | POA: Diagnosis present

## 2014-10-02 DIAGNOSIS — I10 Essential (primary) hypertension: Secondary | ICD-10-CM | POA: Diagnosis present

## 2014-10-02 DIAGNOSIS — Z7982 Long term (current) use of aspirin: Secondary | ICD-10-CM

## 2014-10-02 DIAGNOSIS — E669 Obesity, unspecified: Secondary | ICD-10-CM | POA: Diagnosis present

## 2014-10-02 DIAGNOSIS — N289 Disorder of kidney and ureter, unspecified: Secondary | ICD-10-CM | POA: Diagnosis present

## 2014-10-02 DIAGNOSIS — K460 Unspecified abdominal hernia with obstruction, without gangrene: Secondary | ICD-10-CM

## 2014-10-02 HISTORY — DX: Chronic kidney disease, unspecified: N18.9

## 2014-10-02 HISTORY — DX: Essential (primary) hypertension: I10

## 2014-10-02 HISTORY — DX: Dehydration: E86.0

## 2014-10-02 LAB — CBC WITH DIFFERENTIAL/PLATELET
BASOS ABS: 0 10*3/uL (ref 0.0–0.1)
BASOS PCT: 0 % (ref 0–1)
EOS PCT: 0 % (ref 0–5)
Eosinophils Absolute: 0 10*3/uL (ref 0.0–0.7)
HEMATOCRIT: 43.1 % (ref 36.0–46.0)
Hemoglobin: 13.9 g/dL (ref 12.0–15.0)
Lymphocytes Relative: 9 % — ABNORMAL LOW (ref 12–46)
Lymphs Abs: 1 10*3/uL (ref 0.7–4.0)
MCH: 28.4 pg (ref 26.0–34.0)
MCHC: 32.3 g/dL (ref 30.0–36.0)
MCV: 88 fL (ref 78.0–100.0)
MONO ABS: 0.3 10*3/uL (ref 0.1–1.0)
Monocytes Relative: 3 % (ref 3–12)
NEUTROS ABS: 8.9 10*3/uL — AB (ref 1.7–7.7)
Neutrophils Relative %: 88 % — ABNORMAL HIGH (ref 43–77)
Platelets: 396 10*3/uL (ref 150–400)
RBC: 4.9 MIL/uL (ref 3.87–5.11)
RDW: 14.4 % (ref 11.5–15.5)
WBC: 10.2 10*3/uL (ref 4.0–10.5)

## 2014-10-02 LAB — URINALYSIS, ROUTINE W REFLEX MICROSCOPIC
Bilirubin Urine: NEGATIVE
Glucose, UA: NEGATIVE mg/dL
Hgb urine dipstick: NEGATIVE
Ketones, ur: 15 mg/dL — AB
NITRITE: POSITIVE — AB
Protein, ur: >300 mg/dL — AB
SPECIFIC GRAVITY, URINE: 1.03 (ref 1.005–1.030)
UROBILINOGEN UA: 1 mg/dL (ref 0.0–1.0)
pH: 7.5 (ref 5.0–8.0)

## 2014-10-02 LAB — COMPREHENSIVE METABOLIC PANEL
ALBUMIN: 4.3 g/dL (ref 3.5–5.2)
ALT: 18 U/L (ref 0–35)
ANION GAP: 12 (ref 5–15)
AST: 28 U/L (ref 0–37)
Alkaline Phosphatase: 58 U/L (ref 39–117)
BILIRUBIN TOTAL: 0.7 mg/dL (ref 0.3–1.2)
BUN: 18 mg/dL (ref 6–23)
CALCIUM: 9.7 mg/dL (ref 8.4–10.5)
CO2: 27 mmol/L (ref 19–32)
CREATININE: 1.2 mg/dL — AB (ref 0.50–1.10)
Chloride: 105 mmol/L (ref 96–112)
GFR calc Af Amer: 51 mL/min — ABNORMAL LOW (ref >90)
GFR calc non Af Amer: 44 mL/min — ABNORMAL LOW (ref >90)
Glucose, Bld: 143 mg/dL — ABNORMAL HIGH (ref 70–99)
Potassium: 3.5 mmol/L (ref 3.5–5.1)
Sodium: 144 mmol/L (ref 135–145)
TOTAL PROTEIN: 8.1 g/dL (ref 6.0–8.3)

## 2014-10-02 LAB — URINE MICROSCOPIC-ADD ON

## 2014-10-02 LAB — I-STAT CG4 LACTIC ACID, ED: Lactic Acid, Venous: 2.2 mmol/L (ref 0.5–2.0)

## 2014-10-02 LAB — LIPASE, BLOOD: Lipase: 101 U/L — ABNORMAL HIGH (ref 11–59)

## 2014-10-02 MED ORDER — KCL IN DEXTROSE-NACL 20-5-0.9 MEQ/L-%-% IV SOLN
INTRAVENOUS | Status: DC
  Administered 2014-10-02: 15:00:00 via INTRAVENOUS
  Administered 2014-10-03 – 2014-10-04 (×3): 100 mL/h via INTRAVENOUS
  Administered 2014-10-05 – 2014-10-08 (×8): via INTRAVENOUS
  Filled 2014-10-02 (×14): qty 1000

## 2014-10-02 MED ORDER — MORPHINE SULFATE 4 MG/ML IJ SOLN
4.0000 mg | Freq: Once | INTRAMUSCULAR | Status: AC
  Administered 2014-10-02: 4 mg via INTRAVENOUS
  Filled 2014-10-02: qty 1

## 2014-10-02 MED ORDER — SODIUM CHLORIDE 0.9 % IV BOLUS (SEPSIS)
500.0000 mL | Freq: Once | INTRAVENOUS | Status: AC
  Administered 2014-10-02: 500 mL via INTRAVENOUS

## 2014-10-02 MED ORDER — IOHEXOL 300 MG/ML  SOLN
50.0000 mL | Freq: Once | INTRAMUSCULAR | Status: AC | PRN
  Administered 2014-10-02: 50 mL via ORAL

## 2014-10-02 MED ORDER — CETYLPYRIDINIUM CHLORIDE 0.05 % MT LIQD
7.0000 mL | Freq: Two times a day (BID) | OROMUCOSAL | Status: DC
  Administered 2014-10-02 – 2014-10-07 (×7): 7 mL via OROMUCOSAL

## 2014-10-02 MED ORDER — ONDANSETRON HCL 4 MG/2ML IJ SOLN: 4.0000 mg | Freq: Four times a day (QID) | INTRAMUSCULAR | Status: DC | PRN

## 2014-10-02 MED ORDER — HYDROMORPHONE HCL 1 MG/ML IJ SOLN
0.5000 mg | INTRAMUSCULAR | Status: DC | PRN
  Administered 2014-10-02: 1 mg via INTRAVENOUS
  Filled 2014-10-02: qty 1

## 2014-10-02 MED ORDER — ONDANSETRON HCL 4 MG/2ML IJ SOLN
4.0000 mg | Freq: Once | INTRAMUSCULAR | Status: AC
  Administered 2014-10-02: 4 mg via INTRAVENOUS
  Filled 2014-10-02: qty 2

## 2014-10-02 MED ORDER — HYDRALAZINE HCL 20 MG/ML IJ SOLN
10.0000 mg | Freq: Four times a day (QID) | INTRAMUSCULAR | Status: DC | PRN
  Administered 2014-10-05 – 2014-10-06 (×2): 10 mg via INTRAVENOUS
  Filled 2014-10-02 (×3): qty 1

## 2014-10-02 MED ORDER — IOHEXOL 300 MG/ML  SOLN: 50.0000 mL | Freq: Once | INTRAMUSCULAR | Status: DC | PRN

## 2014-10-02 MED ORDER — HEPARIN SODIUM (PORCINE) 5000 UNIT/ML IJ SOLN
5000.0000 [IU] | Freq: Three times a day (TID) | INTRAMUSCULAR | Status: DC
  Administered 2014-10-02 – 2014-10-09 (×19): 5000 [IU] via SUBCUTANEOUS
  Filled 2014-10-02 (×24): qty 1

## 2014-10-02 MED ORDER — IOHEXOL 300 MG/ML  SOLN
80.0000 mL | Freq: Once | INTRAMUSCULAR | Status: AC | PRN
  Administered 2014-10-02: 80 mL via INTRAVENOUS

## 2014-10-02 MED ORDER — CHLORHEXIDINE GLUCONATE 0.12 % MT SOLN
15.0000 mL | Freq: Two times a day (BID) | OROMUCOSAL | Status: DC
  Administered 2014-10-02 – 2014-10-08 (×10): 15 mL via OROMUCOSAL
  Filled 2014-10-02 (×15): qty 15

## 2014-10-02 NOTE — Progress Notes (Signed)
UR completed 

## 2014-10-02 NOTE — H&P (Signed)
Chief Complaint: abdominal pain, nausea and vomiting  HPI: [name] is a 74 year old female with a history of obesity, hypertension, diverticulosis s/p partial colectomy, known hiatal hernia since 2012 presenting with abdominal pain, nausea and vomiting.  Duration of symptoms is 1 day.  Onset was sudden around 10PM last night.  Severe in severity.  Time pattern is constant.  Associated with chills and sweats, loose stools.  Location of pain is epigastric region.  No modifying factors.  No aggravating or alleviating factors.  Denies previous symptoms.  CT of abdomen and pelvis significant for a gastric outlet obstruction from an incarcerated Bochdalek's hernia.  We have therefore been asked to evaluate.  She is afebrile, normal white count.  sCr 1.2, lactic acid 2.2.  She has been NPO since last night.   Past Medical History  Diagnosis Date  . Hypertension     Past Surgical History  Procedure Laterality Date  . Cholecystectomy    . Colon surgery    . Abdominal hysterectomy    . Partial colectomy      History reviewed. No pertinent family history. Social History:  reports that she has never smoked. She does not have any smokeless tobacco history on file. She reports that she does not drink alcohol or use illicit drugs.  Allergies: No Known Allergies Medication: Prior to Admission medications   Medication Sig Start Date End Date Taking? Authorizing Provider  amLODipine-benazepril (LOTREL) 5-20 MG per capsule Take 1 capsule by mouth daily.   Yes Historical Provider, MD  aspirin EC 81 MG tablet Take 81 mg by mouth daily.   Yes Historical Provider, MD  furosemide (LASIX) 40 MG tablet Take 40 mg by mouth.   Yes Historical Provider, MD  HYDROcodone-acetaminophen (NORCO) 5-325 MG per tablet Take 2 tablets by mouth every 4 (four) hours as needed. 03/03/14   Veryl Speak, MD  omeprazole (PRILOSEC) 20 MG capsule Take 1 capsule (20 mg total) by mouth 2 (two) times daily. 03/03/14  Yes Veryl Speak,  MD     (Not in a hospital admission)  Results for orders placed or performed during the hospital encounter of 10/02/14 (from the past 48 hour(s))  CBC with Differential     Status: Abnormal   Collection Time: 10/02/14 10:32 AM  Result Value Ref Range   WBC 10.2 4.0 - 10.5 K/uL   RBC 4.90 3.87 - 5.11 MIL/uL   Hemoglobin 13.9 12.0 - 15.0 g/dL   HCT 43.1 36.0 - 46.0 %   MCV 88.0 78.0 - 100.0 fL   MCH 28.4 26.0 - 34.0 pg   MCHC 32.3 30.0 - 36.0 g/dL   RDW 14.4 11.5 - 15.5 %   Platelets 396 150 - 400 K/uL   Neutrophils Relative % 88 (H) 43 - 77 %   Neutro Abs 8.9 (H) 1.7 - 7.7 K/uL   Lymphocytes Relative 9 (L) 12 - 46 %   Lymphs Abs 1.0 0.7 - 4.0 K/uL   Monocytes Relative 3 3 - 12 %   Monocytes Absolute 0.3 0.1 - 1.0 K/uL   Eosinophils Relative 0 0 - 5 %   Eosinophils Absolute 0.0 0.0 - 0.7 K/uL   Basophils Relative 0 0 - 1 %   Basophils Absolute 0.0 0.0 - 0.1 K/uL  Comprehensive metabolic panel     Status: Abnormal   Collection Time: 10/02/14 10:32 AM  Result Value Ref Range   Sodium 144 135 - 145 mmol/L   Potassium 3.5 3.5 - 5.1 mmol/L  Chloride 105 96 - 112 mmol/L   CO2 27 19 - 32 mmol/L   Glucose, Bld 143 (H) 70 - 99 mg/dL   BUN 18 6 - 23 mg/dL   Creatinine, Ser 1.20 (H) 0.50 - 1.10 mg/dL   Calcium 9.7 8.4 - 10.5 mg/dL   Total Protein 8.1 6.0 - 8.3 g/dL   Albumin 4.3 3.5 - 5.2 g/dL   AST 28 0 - 37 U/L   ALT 18 0 - 35 U/L   Alkaline Phosphatase 58 39 - 117 U/L   Total Bilirubin 0.7 0.3 - 1.2 mg/dL   GFR calc non Af Amer 44 (L) >90 mL/min   GFR calc Af Amer 51 (L) >90 mL/min    Comment: (NOTE) The eGFR has been calculated using the CKD EPI equation. This calculation has not been validated in all clinical situations. eGFR's persistently <90 mL/min signify possible Chronic Kidney Disease.    Anion gap 12 5 - 15  Lipase, blood     Status: Abnormal   Collection Time: 10/02/14 10:32 AM  Result Value Ref Range   Lipase 101 (H) 11 - 59 U/L  I-Stat CG4 Lactic Acid, ED      Status: Abnormal   Collection Time: 10/02/14 10:38 AM  Result Value Ref Range   Lactic Acid, Venous 2.20 (HH) 0.5 - 2.0 mmol/L   Comment NOTIFIED PHYSICIAN    Ct Abdomen Pelvis W Contrast  10/02/2014   CLINICAL DATA:  Abdominal pain with nausea and vomiting for 1 day. No fever. Initial encounter.  EXAM: CT ABDOMEN AND PELVIS WITH CONTRAST  TECHNIQUE: Multidetector CT imaging of the abdomen and pelvis was performed using the standard protocol following bolus administration of intravenous contrast.  CONTRAST:  60m OMNIPAQUE IOHEXOL 300 MG/ML  SOLN  COMPARISON:  Abdominal pelvic CT 03/03/2014 and 03/16/2004.  FINDINGS: Lower chest: Several small nodules at the right lung base are stable, measuring up to 4 mm on image 3. This is not grossly changed 2005, consistent with a benign finding. There is a large left-sided Bochdalek's hernia containing approximately a third of the stomach, similar to the most recent examination. There is small amount of adjacent left pleural fluid which is unchanged. Associated left lower lobe atelectasis is slightly improved from the most recent study.  Hepatobiliary: The liver is normal in density without focal abnormality. The biliary system appears unchanged status post cholecystectomy. Next found  Pancreas: Unremarkable. No pancreatic ductal dilatation or surrounding inflammatory changes.  Spleen: Normal in size without focal abnormality.  Adrenals/Urinary Tract: Both adrenal glands appear normal.There is stable marked renal cortical thinning on the right. Bilateral renal cysts are noted. There is no evidence of hydronephrosis or ureteral calculus. The bladder appears unremarkable.  Stomach/Bowel: No enteric contrast was administered. Although herniation of the distal gastric body into the left-sided Bochdalek hernia is chronic, there is increased fluid distension of the subdiaphragmatic portion of the proximal gastric body. The proximal small bowel is decompressed. Appearance  is concerning for gastric outlet obstruction. There are diverticular changes throughout the colon.  Vascular/Lymphatic: There are no enlarged abdominal or pelvic lymph nodes. Stable mild aortoiliac atherosclerosis.  Reproductive: Status post hysterectomy. No evidence of adnexal mass.  Other: No evidence of abdominal wall mass or hernia.  Musculoskeletal: No acute or significant osseous findings. There is a stable convex left thoracolumbar scoliosis with endplate sclerosis in the lower thoracic spine.  IMPRESSION: 1. Increased fluid distension of the stomach concerning for incarceration within the known Bochdalek's hernia and gastric  outlet obstruction. Surgical consultation recommended. 2. No other evidence of acute process within the abdomen or pelvis. 3. Chronic right renal cortical thinning and bilateral renal cysts. 4. Stable nodules at the right lung base consistent with a benign etiology. 5. These results were called by telephone at the time of interpretation on 10/02/2014 at 1:11 pm to Dr. Roderic Palau , who verbally acknowledged these results.   Electronically Signed   By: Richardean Sale M.D.   On: 10/02/2014 13:13    Review of Systems  All other systems reviewed and are negative.   Blood pressure 133/72, pulse 92, temperature 97.9 F (36.6 C), temperature source Oral, resp. rate 18, SpO2 96 %. Physical Exam  Constitutional: She is oriented to person, place, and time. She appears well-developed. She appears distressed.  Cardiovascular: Normal rate, regular rhythm, normal heart sounds and intact distal pulses.  Exam reveals no gallop.   No murmur heard. Respiratory: Effort normal and breath sounds normal. No respiratory distress. She has no wheezes. She has no rales. She exhibits no tenderness.  GI: Soft. Bowel sounds are normal. She exhibits distension. She exhibits no mass. There is no rebound and no guarding.  TTP epigastric region, LUQ.   Musculoskeletal: Normal range of motion. She exhibits no  edema or tenderness.  Neurological: She is alert and oriented to person, place, and time.  Skin: Skin is warm and dry. No rash noted. She is not diaphoretic. No erythema. No pallor.  Psychiatric: She has a normal mood and affect. Her behavior is normal. Judgment and thought content normal.     Assessment/Plan GOO with incarcerated Bochdalek's hernia -incarcerated without evidence of strangulation.  Will likely need repair.  -place NGT to LWIS -NPO -dilaudid for pain -anti-emetics -SCDs/lovenox -IVF Acute on chronic renal insufficiency(baseline 1.19 7 months ago) -give additonal 500cc fluid bolus -maintenance fluids -repeat labs in AM Hypertension -PRN hydralazine, hold home meds for now  Geneen Dieter ANP-BC 10/02/2014, 1:54 PM

## 2014-10-02 NOTE — ED Notes (Signed)
Patient transported to CT 

## 2014-10-02 NOTE — ED Provider Notes (Signed)
CSN: [id number]     Arrival date & time 10/02/14  0940 History   First MD Initiated Contact with Patient 10/02/14 1013     Chief Complaint  Patient presents with  . Abdominal Pain     (Consider location/radiation/quality/duration/timing/severity/associated sxs/prior Treatment) Patient is a 74 y.o. female presenting with abdominal pain. The history is provided by the patient and medical records.  Abdominal Pain Associated symptoms: nausea and vomiting     This is a 74 year old female with past medical history significant for hypertension, GERD, presenting to the ED for abdominal pain. Patient states pain began last night around 2200 and has been progressively worsening. She notes associated nausea, vomiting, and loose bowel movements, but no frank diarrhea. No melena or hematochezia. Patient states currently pain is localized to her periumbilical region, described as sharp and stabbing without radiation.  No fever, chills, or urinary symptoms. Patient was seen by PCP at [facility] and referred here for further evaluation. She does have history of diverticulosis. Prior abdominal surgeries include cholecystectomy and hysterectomy.  No intervention tried prior to arrival.    Past Medical History  Diagnosis Date  . Hypertension    Past Surgical History  Procedure Laterality Date  . Cholecystectomy    . Colon surgery    . Abdominal hysterectomy     History reviewed. No pertinent family history. History  Substance Use Topics  . Smoking status: Never Smoker   . Smokeless tobacco: Not on file  . Alcohol Use: No   OB History    No data available     Review of Systems  Gastrointestinal: Positive for nausea, vomiting and abdominal pain.  All other systems reviewed and are negative.     Allergies  Review of patient's allergies indicates no known allergies.  Home Medications   Prior to Admission medications   Medication Sig Start Date End Date Taking? Authorizing Provider   amLODipine-benazepril (LOTREL) 5-20 MG per capsule Take 1 capsule by mouth daily.    Historical Provider, MD  aspirin EC 81 MG tablet Take 81 mg by mouth daily.    Historical Provider, MD  furosemide (LASIX) 40 MG tablet Take 40 mg by mouth.    Historical Provider, MD  HYDROcodone-acetaminophen (NORCO) 5-325 MG per tablet Take 2 tablets by mouth every 4 (four) hours as needed. 03/03/14   Veryl Speak, MD  omeprazole (PRILOSEC) 20 MG capsule Take 1 capsule (20 mg total) by mouth 2 (two) times daily. 03/03/14   Veryl Speak, MD   BP 146/93 mmHg  Pulse 93  Temp(Src) 97.4 F (36.3 C) (Oral)  Resp 16  SpO2 99%   Physical Exam  Constitutional: She is oriented to person, place, and time. She appears well-developed and well-nourished.  Elderly, appears uncomfortable  HENT:  Head: Normocephalic and atraumatic.  Mouth/Throat: Oropharynx is clear and moist.  Eyes: Conjunctivae and EOM are normal. Pupils are equal, round, and reactive to light.  Neck: Normal range of motion.  Cardiovascular: Normal rate, regular rhythm and normal heart sounds.   Pulmonary/Chest: Effort normal and breath sounds normal.  Abdominal: Soft. Bowel sounds are normal. There is tenderness in the periumbilical area. There is guarding. There is no CVA tenderness.  Abdomen soft, nondistended, tenderness in periumbilical region with voluntary guarding  Musculoskeletal: Normal range of motion.  Neurological: She is alert and oriented to person, place, and time.  Skin: Skin is warm and dry.  Psychiatric: She has a normal mood and affect.  Nursing note and vitals reviewed.  ED Course  Procedures (including critical care time) Labs Review Labs Reviewed  CBC WITH DIFFERENTIAL/PLATELET - Abnormal; Notable for the following:    Neutrophils Relative % 88 (*)    Neutro Abs 8.9 (*)    Lymphocytes Relative 9 (*)    All other components within normal limits  COMPREHENSIVE METABOLIC PANEL - Abnormal; Notable for the following:     Glucose, Bld 143 (*)    Creatinine, Ser 1.20 (*)    GFR calc non Af Amer 44 (*)    GFR calc Af Amer 51 (*)    All other components within normal limits  LIPASE, BLOOD - Abnormal; Notable for the following:    Lipase 101 (*)    All other components within normal limits  I-STAT CG4 LACTIC ACID, ED - Abnormal; Notable for the following:    Lactic Acid, Venous 2.20 (*)    All other components within normal limits  URINALYSIS, ROUTINE W REFLEX MICROSCOPIC    Imaging Review Ct Abdomen Pelvis W Contrast  10/02/2014   CLINICAL DATA:  Abdominal pain with nausea and vomiting for 1 day. No fever. Initial encounter.  EXAM: CT ABDOMEN AND PELVIS WITH CONTRAST  TECHNIQUE: Multidetector CT imaging of the abdomen and pelvis was performed using the standard protocol following bolus administration of intravenous contrast.  CONTRAST:  39mL OMNIPAQUE IOHEXOL 300 MG/ML  SOLN  COMPARISON:  Abdominal pelvic CT 03/03/2014 and 03/16/2004.  FINDINGS: Lower chest: Several small nodules at the right lung base are stable, measuring up to 4 mm on image 3. This is not grossly changed 2005, consistent with a benign finding. There is a large left-sided Bochdalek's hernia containing approximately a third of the stomach, similar to the most recent examination. There is small amount of adjacent left pleural fluid which is unchanged. Associated left lower lobe atelectasis is slightly improved from the most recent study.  Hepatobiliary: The liver is normal in density without focal abnormality. The biliary system appears unchanged status post cholecystectomy. Next found  Pancreas: Unremarkable. No pancreatic ductal dilatation or surrounding inflammatory changes.  Spleen: Normal in size without focal abnormality.  Adrenals/Urinary Tract: Both adrenal glands appear normal.There is stable marked renal cortical thinning on the right. Bilateral renal cysts are noted. There is no evidence of hydronephrosis or ureteral calculus. The bladder  appears unremarkable.  Stomach/Bowel: No enteric contrast was administered. Although herniation of the distal gastric body into the left-sided Bochdalek hernia is chronic, there is increased fluid distension of the subdiaphragmatic portion of the proximal gastric body. The proximal small bowel is decompressed. Appearance is concerning for gastric outlet obstruction. There are diverticular changes throughout the colon.  Vascular/Lymphatic: There are no enlarged abdominal or pelvic lymph nodes. Stable mild aortoiliac atherosclerosis.  Reproductive: Status post hysterectomy. No evidence of adnexal mass.  Other: No evidence of abdominal wall mass or hernia.  Musculoskeletal: No acute or significant osseous findings. There is a stable convex left thoracolumbar scoliosis with endplate sclerosis in the lower thoracic spine.  IMPRESSION: 1. Increased fluid distension of the stomach concerning for incarceration within the known Bochdalek's hernia and gastric outlet obstruction. Surgical consultation recommended. 2. No other evidence of acute process within the abdomen or pelvis. 3. Chronic right renal cortical thinning and bilateral renal cysts. 4. Stable nodules at the right lung base consistent with a benign etiology. 5. These results were called by telephone at the time of interpretation on 10/02/2014 at 1:11 pm to Dr. Roderic Palau , who verbally acknowledged these results.   Electronically  Signed   By: Richardean Sale M.D.   On: 10/02/2014 13:13     EKG Interpretation None      MDM   Final diagnoses:  Periumbilical pain  Incarcerated hernia   74 y.o. F with periumbilical pain since 3893 last night. She notes associated nausea, vomiting, and loose stools. No fever or chills. On exam, patient appears uncomfortable but is afebrile and overall nontoxic. She has tenderness in her periumbilical region with voluntary guarding.  Lab work overall reassuring, lactic acid mildly elevated at 2.20. Lipase also mildly elevated  at 101.  CT abdomen pelvis obtained with findings concerning for incarcerated Bochdalek's hernia.  Case discussed with general surgery who has evaluated patient in the ED and will admit for further management.  Larene Pickett, PA-C 10/02/14 [place], PA-C 10/02/14 1416  Milton Ferguson, MD 10/02/14 [phone]

## 2014-10-02 NOTE — ED Notes (Signed)
Pt states abdominal pain n/v since last night.  3-4 soft stools this morning. Went to MD office and sent here.  No fever

## 2014-10-02 NOTE — ED Notes (Signed)
Pt sent from [place] at [facility] for abdominal pain n/v/d.

## 2014-10-02 NOTE — ED Notes (Signed)
Provider made aware of abnormal lactic. 

## 2014-10-03 LAB — CBC
HEMATOCRIT: 37.9 % (ref 36.0–46.0)
Hemoglobin: 11.9 g/dL — ABNORMAL LOW (ref 12.0–15.0)
MCH: 28 pg (ref 26.0–34.0)
MCHC: 31.4 g/dL (ref 30.0–36.0)
MCV: 89.2 fL (ref 78.0–100.0)
PLATELETS: 353 10*3/uL (ref 150–400)
RBC: 4.25 MIL/uL (ref 3.87–5.11)
RDW: 14.9 % (ref 11.5–15.5)
WBC: 12 10*3/uL — AB (ref 4.0–10.5)

## 2014-10-03 LAB — BASIC METABOLIC PANEL
Anion gap: 6 (ref 5–15)
BUN: 17 mg/dL (ref 6–23)
CALCIUM: 8.8 mg/dL (ref 8.4–10.5)
CHLORIDE: 113 mmol/L — AB (ref 96–112)
CO2: 29 mmol/L (ref 19–32)
Creatinine, Ser: 1.18 mg/dL — ABNORMAL HIGH (ref 0.50–1.10)
GFR calc Af Amer: 52 mL/min — ABNORMAL LOW (ref >90)
GFR calc non Af Amer: 45 mL/min — ABNORMAL LOW (ref >90)
Glucose, Bld: 116 mg/dL — ABNORMAL HIGH (ref 70–99)
Potassium: 4 mmol/L (ref 3.5–5.1)
Sodium: 148 mmol/L — ABNORMAL HIGH (ref 135–145)

## 2014-10-03 MED ORDER — PHENOL 1.4 % MT LIQD
1.0000 | OROMUCOSAL | Status: DC | PRN
  Administered 2014-10-03: 1 via OROMUCOSAL
  Filled 2014-10-03: qty 177

## 2014-10-03 MED ORDER — CEPASTAT 14.5 MG MT LOZG
1.0000 | LOZENGE | OROMUCOSAL | Status: DC | PRN
Start: 2014-10-03 — End: 2014-10-09
  Administered 2014-10-03: 1 via BUCCAL
  Filled 2014-10-03: qty 9

## 2014-10-03 NOTE — Progress Notes (Signed)
Subjective: She feels much better.  No nausea, no pain.  She does report her urine had a very strong odor yesterday.  Objective: Vital signs in last 24 hours: Temp:  [97.4 F (36.3 C)-98.2 F (36.8 C)] 98.1 F (36.7 C) (04/06 0513) Pulse Rate:  [72-93] 75 (04/06 0513) Resp:  [16-18] 16 (04/06 0513) BP: (133-146)/(63-93) 144/65 mmHg (04/06 0513) SpO2:  [93 %-99 %] 96 % (04/06 0513) Last BM Date: 10/01/14 1750 from the NG/NPO Afebrile, VSS NA 148 CL 113  Creatinine 1.18 Glucose 116 WBC 12K ? UTI Intake/Output from previous day: 04/05 0701 - 04/06 0700 In: 2011.7 [I.V.:1511.7; IV Piggyback:500] Out: 2550 [Urine:800; Emesis/NG output:1750] Intake/Output this shift:    General appearance: alert, cooperative and no distress Resp: clear to auscultation bilaterally GI: soft, non-tender; bowel sounds normal; no masses,  no organomegaly  Lab Results:   Recent Labs  10/02/14 1032 10/03/14 0507  WBC 10.2 12.0*  HGB 13.9 11.9*  HCT 43.1 37.9  PLT 396 353    BMET  Recent Labs  10/02/14 1032 10/03/14 0507  NA 144 148*  K 3.5 4.0  CL 105 113*  CO2 27 29  GLUCOSE 143* 116*  BUN 18 17  CREATININE 1.20* 1.18*  CALCIUM 9.7 8.8   PT/INR No results for input(s): LABPROT, INR in the last 72 hours.   Recent Labs Lab 10/02/14 1032  AST 28  ALT 18  ALKPHOS 58  BILITOT 0.7  PROT 8.1  ALBUMIN 4.3     Lipase     Component Value Date/Time   LIPASE 101* 10/02/2014 1032     Studies/Results: Ct Abdomen Pelvis W Contrast  10/02/2014   CLINICAL DATA:  Abdominal pain with nausea and vomiting for 1 day. No fever. Initial encounter.  EXAM: CT ABDOMEN AND PELVIS WITH CONTRAST  TECHNIQUE: Multidetector CT imaging of the abdomen and pelvis was performed using the standard protocol following bolus administration of intravenous contrast.  CONTRAST:  42mL OMNIPAQUE IOHEXOL 300 MG/ML  SOLN  COMPARISON:  Abdominal pelvic CT 03/03/2014 and 03/16/2004.  FINDINGS: Lower chest:  Several small nodules at the right lung base are stable, measuring up to 4 mm on image 3. This is not grossly changed 2005, consistent with a benign finding. There is a large left-sided Bochdalek's hernia containing approximately a third of the stomach, similar to the most recent examination. There is small amount of adjacent left pleural fluid which is unchanged. Associated left lower lobe atelectasis is slightly improved from the most recent study.  Hepatobiliary: The liver is normal in density without focal abnormality. The biliary system appears unchanged status post cholecystectomy. Next found  Pancreas: Unremarkable. No pancreatic ductal dilatation or surrounding inflammatory changes.  Spleen: Normal in size without focal abnormality.  Adrenals/Urinary Tract: Both adrenal glands appear normal.There is stable marked renal cortical thinning on the right. Bilateral renal cysts are noted. There is no evidence of hydronephrosis or ureteral calculus. The bladder appears unremarkable.  Stomach/Bowel: No enteric contrast was administered. Although herniation of the distal gastric body into the left-sided Bochdalek hernia is chronic, there is increased fluid distension of the subdiaphragmatic portion of the proximal gastric body. The proximal small bowel is decompressed. Appearance is concerning for gastric outlet obstruction. There are diverticular changes throughout the colon.  Vascular/Lymphatic: There are no enlarged abdominal or pelvic lymph nodes. Stable mild aortoiliac atherosclerosis.  Reproductive: Status post hysterectomy. No evidence of adnexal mass.  Other: No evidence of abdominal wall mass or hernia.  Musculoskeletal: No acute  or significant osseous findings. There is a stable convex left thoracolumbar scoliosis with endplate sclerosis in the lower thoracic spine.  IMPRESSION: 1. Increased fluid distension of the stomach concerning for incarceration within the known Bochdalek's hernia and gastric outlet  obstruction. Surgical consultation recommended. 2. No other evidence of acute process within the abdomen or pelvis. 3. Chronic right renal cortical thinning and bilateral renal cysts. 4. Stable nodules at the right lung base consistent with a benign etiology. 5. These results were called by telephone at the time of interpretation on 10/02/2014 at 1:11 pm to Dr. Roderic Palau , who verbally acknowledged these results.   Electronically Signed   By: Richardean Sale M.D.   On: 10/02/2014 13:13    Medications: . antiseptic oral rinse  7 mL Mouth Rinse q12n4p  . chlorhexidine  15 mL Mouth Rinse BID  . heparin  5,000 Units Subcutaneous 3 times per day    Assessment/Plan GOO with incarcerated Bochdalek's hernia Acute on chronic renal insufficiency(baseline 1.19 7 months ago) Hypertension Heparin/SCD for DVT prophylaxis No antibiotics ? UTI    Plan:  Ice chips for comfort with some sips.  Continue NG, check urine culture.  Dr. Marlou Starks will discuss plans for repair.   LOS: 1 day    Anoop Hemmer 10/03/2014

## 2014-10-04 ENCOUNTER — Inpatient Hospital Stay (HOSPITAL_COMMUNITY): Payer: Medicare Other

## 2014-10-04 ENCOUNTER — Inpatient Hospital Stay (HOSPITAL_COMMUNITY): Payer: Medicare Other | Admitting: Anesthesiology

## 2014-10-04 ENCOUNTER — Encounter (HOSPITAL_COMMUNITY): Admission: EM | Disposition: A | Discharge status: Home / Self Care | Payer: Self-pay | Source: Home / Self Care

## 2014-10-04 ENCOUNTER — Encounter (HOSPITAL_COMMUNITY): Payer: Self-pay | Admitting: Anesthesiology

## 2014-10-04 HISTORY — PX: VENTRAL HERNIA REPAIR: SHX424

## 2014-10-04 HISTORY — PX: GASTROSTOMY: SHX5249

## 2014-10-04 LAB — SURGICAL PCR SCREEN
MRSA, PCR: NEGATIVE
Staphylococcus aureus: NEGATIVE

## 2014-10-04 SURGERY — INSERTION OF GASTROSTOMY TUBE
Anesthesia: General | Site: Abdomen

## 2014-10-04 MED ORDER — POVIDONE-IODINE 10 % EX OINT
TOPICAL_OINTMENT | CUTANEOUS | Status: AC
  Filled 2014-10-04: qty 28.35

## 2014-10-04 MED ORDER — ONDANSETRON HCL 4 MG/2ML IJ SOLN
INTRAMUSCULAR | Status: DC | PRN
  Administered 2014-10-04: 4 mg via INTRAVENOUS

## 2014-10-04 MED ORDER — CEFAZOLIN SODIUM-DEXTROSE 2-3 GM-% IV SOLR
INTRAVENOUS | Status: AC
  Filled 2014-10-04: qty 50

## 2014-10-04 MED ORDER — CISATRACURIUM BESYLATE (PF) 10 MG/5ML IV SOLN
INTRAVENOUS | Status: DC | PRN
  Administered 2014-10-04 (×2): 4 mg via INTRAVENOUS
  Administered 2014-10-04: 6 mg via INTRAVENOUS

## 2014-10-04 MED ORDER — 0.9 % SODIUM CHLORIDE (POUR BTL) OPTIME
TOPICAL | Status: DC | PRN
  Administered 2014-10-04: 2000 mL

## 2014-10-04 MED ORDER — NEOSTIGMINE METHYLSULFATE 10 MG/10ML IV SOLN
INTRAVENOUS | Status: DC | PRN
  Administered 2014-10-04: 4 mg via INTRAVENOUS

## 2014-10-04 MED ORDER — PHENYLEPHRINE HCL 10 MG/ML IJ SOLN
10.0000 mg | INTRAVENOUS | Status: DC | PRN
  Administered 2014-10-04: 30 ug/min via INTRAVENOUS

## 2014-10-04 MED ORDER — HYDROMORPHONE HCL 2 MG/ML IJ SOLN
INTRAMUSCULAR | Status: AC
  Filled 2014-10-04: qty 1

## 2014-10-04 MED ORDER — LACTATED RINGERS IV SOLN
INTRAVENOUS | Status: DC
  Administered 2014-10-04: 1000 mL via INTRAVENOUS

## 2014-10-04 MED ORDER — HYDROMORPHONE HCL 1 MG/ML IJ SOLN
0.5000 mg | INTRAMUSCULAR | Status: DC | PRN
  Administered 2014-10-04: 1 mg via INTRAVENOUS
  Administered 2014-10-04 (×2): 0.5 mg via INTRAVENOUS
  Administered 2014-10-04 – 2014-10-05 (×5): 1 mg via INTRAVENOUS
  Administered 2014-10-08: 0.5 mg via INTRAVENOUS
  Filled 2014-10-04 (×9): qty 1

## 2014-10-04 MED ORDER — CEFAZOLIN SODIUM-DEXTROSE 2-3 GM-% IV SOLR
2.0000 g | INTRAVENOUS | Status: AC
  Administered 2014-10-04: 2 g via INTRAVENOUS

## 2014-10-04 MED ORDER — PHENYLEPHRINE HCL 10 MG/ML IJ SOLN
INTRAMUSCULAR | Status: AC
  Filled 2014-10-04: qty 1

## 2014-10-04 MED ORDER — FENTANYL CITRATE 0.05 MG/ML IJ SOLN
INTRAMUSCULAR | Status: AC
  Filled 2014-10-04: qty 5

## 2014-10-04 MED ORDER — PROPOFOL 10 MG/ML IV BOLUS
INTRAVENOUS | Status: AC
  Filled 2014-10-04: qty 20

## 2014-10-04 MED ORDER — HYDROMORPHONE HCL 1 MG/ML IJ SOLN
INTRAMUSCULAR | Status: AC
  Filled 2014-10-04: qty 1

## 2014-10-04 MED ORDER — ONDANSETRON HCL 4 MG/2ML IJ SOLN
INTRAMUSCULAR | Status: AC
Start: 2014-10-04 — End: 2014-10-04
  Filled 2014-10-04: qty 2

## 2014-10-04 MED ORDER — HYDROMORPHONE HCL 1 MG/ML IJ SOLN
0.2500 mg | INTRAMUSCULAR | Status: DC | PRN
  Administered 2014-10-04: 0.5 mg via INTRAVENOUS

## 2014-10-04 MED ORDER — PHENYLEPHRINE HCL 10 MG/ML IJ SOLN
INTRAMUSCULAR | Status: DC | PRN
  Administered 2014-10-04: 120 ug via INTRAVENOUS
  Administered 2014-10-04 (×4): 80 ug via INTRAVENOUS
  Administered 2014-10-04: 40 ug via INTRAVENOUS

## 2014-10-04 MED ORDER — LACTATED RINGERS IV SOLN
INTRAVENOUS | Status: DC | PRN
  Administered 2014-10-04 (×2): via INTRAVENOUS

## 2014-10-04 MED ORDER — PANTOPRAZOLE SODIUM 40 MG IV SOLR
40.0000 mg | Freq: Every day | INTRAVENOUS | Status: DC
  Administered 2014-10-04 – 2014-10-07 (×4): 40 mg via INTRAVENOUS
  Filled 2014-10-04 (×5): qty 40

## 2014-10-04 MED ORDER — EPHEDRINE SULFATE 50 MG/ML IJ SOLN
INTRAMUSCULAR | Status: DC | PRN
  Administered 2014-10-04: 5 mg via INTRAVENOUS

## 2014-10-04 MED ORDER — MEPERIDINE HCL 50 MG/ML IJ SOLN: 6.2500 mg | INTRAMUSCULAR | Status: DC | PRN

## 2014-10-04 MED ORDER — PROPOFOL 10 MG/ML IV BOLUS
INTRAVENOUS | Status: DC | PRN
  Administered 2014-10-04: 150 mg via INTRAVENOUS

## 2014-10-04 MED ORDER — HYDROMORPHONE HCL 1 MG/ML IJ SOLN
0.2500 mg | INTRAMUSCULAR | Status: DC | PRN
  Administered 2014-10-04 (×4): 0.5 mg via INTRAVENOUS

## 2014-10-04 MED ORDER — GLYCOPYRROLATE 0.2 MG/ML IJ SOLN
INTRAMUSCULAR | Status: DC | PRN
  Administered 2014-10-04: 0.6 mg via INTRAVENOUS

## 2014-10-04 MED ORDER — FENTANYL CITRATE 0.05 MG/ML IJ SOLN
INTRAMUSCULAR | Status: DC | PRN
  Administered 2014-10-04: 50 ug via INTRAVENOUS
  Administered 2014-10-04: 100 ug via INTRAVENOUS
  Administered 2014-10-04 (×2): 50 ug via INTRAVENOUS

## 2014-10-04 MED ORDER — LIDOCAINE HCL (CARDIAC) 20 MG/ML IV SOLN
INTRAVENOUS | Status: AC
  Filled 2014-10-04: qty 5

## 2014-10-04 MED ORDER — PROMETHAZINE HCL 25 MG/ML IJ SOLN: 6.2500 mg | INTRAMUSCULAR | Status: DC | PRN

## 2014-10-04 MED ORDER — SODIUM CHLORIDE 0.9 % IR SOLN
Status: AC
  Filled 2014-10-04: qty 1

## 2014-10-04 MED ORDER — SUCCINYLCHOLINE CHLORIDE 20 MG/ML IJ SOLN
INTRAMUSCULAR | Status: DC | PRN
  Administered 2014-10-04: 90 mg via INTRAVENOUS

## 2014-10-04 MED ORDER — BUPIVACAINE-EPINEPHRINE (PF) 0.25% -1:200000 IJ SOLN
INTRAMUSCULAR | Status: AC
  Filled 2014-10-04: qty 30

## 2014-10-04 SURGICAL SUPPLY — 51 items
APPLIER CLIP ROT 10 11.4 M/L (STAPLE) ×3
APR CLP MED LRG 11.4X10 (STAPLE) ×1
BAG URINE DRAINAGE (UROLOGICAL SUPPLIES) ×2 IMPLANT
BINDER ABDOMINAL 12 ML 46-62 (SOFTGOODS) IMPLANT
BLADE EXTENDED COATED 6.5IN (ELECTRODE) ×2 IMPLANT
BLADE HEX COATED 2.75 (ELECTRODE) ×3 IMPLANT
CATH MALLECOT 28FR (CATHETERS) ×2 IMPLANT
CLIP APPLIE ROT 10 11.4 M/L (STAPLE) IMPLANT
DECANTER SPIKE VIAL GLASS SM (MISCELLANEOUS) IMPLANT
DRAIN CHANNEL 19F RND (DRAIN) ×2 IMPLANT
DRAIN CHANNEL RND F F (WOUND CARE) IMPLANT
DRAIN PENROSE 18X1/2 LTX STRL (DRAIN) ×2 IMPLANT
DRAPE LAPAROSCOPIC ABDOMINAL (DRAPES) ×3 IMPLANT
ELECT REM PT RETURN 9FT ADLT (ELECTROSURGICAL) ×3
ELECTRODE REM PT RTRN 9FT ADLT (ELECTROSURGICAL) ×1 IMPLANT
EVACUATOR SILICONE 100CC (DRAIN) ×2 IMPLANT
GAUZE SPONGE 4X4 12PLY STRL (GAUZE/BANDAGES/DRESSINGS) ×3 IMPLANT
GAUZE SPONGE 4X4 16PLY XRAY LF (GAUZE/BANDAGES/DRESSINGS) ×2 IMPLANT
GLOVE BIO SURGEON STRL SZ7.5 (GLOVE) ×6 IMPLANT
GLOVE BIOGEL PI IND STRL 7.0 (GLOVE) ×1 IMPLANT
GLOVE BIOGEL PI INDICATOR 7.0 (GLOVE) ×2
GOWN STRL REUS W/ TWL XL LVL3 (GOWN DISPOSABLE) ×1 IMPLANT
GOWN STRL REUS W/TWL LRG LVL3 (GOWN DISPOSABLE) ×1 IMPLANT
GOWN STRL REUS W/TWL XL LVL3 (GOWN DISPOSABLE) ×9 IMPLANT
KIT BASIN OR (CUSTOM PROCEDURE TRAY) ×3 IMPLANT
NDL HYPO 25X1 1.5 SAFETY (NEEDLE) IMPLANT
NEEDLE HYPO 25X1 1.5 SAFETY (NEEDLE) IMPLANT
NS IRRIG 1000ML POUR BTL (IV SOLUTION) ×3 IMPLANT
PACK GENERAL/GYN (CUSTOM PROCEDURE TRAY) ×3 IMPLANT
PAD ABD 8X10 STRL (GAUZE/BANDAGES/DRESSINGS) ×2 IMPLANT
SCALPEL HARMONIC ACE (MISCELLANEOUS) ×2 IMPLANT
STAPLER VISISTAT 35W (STAPLE) ×2 IMPLANT
SUCTION POOLE TIP (SUCTIONS) ×2 IMPLANT
SUT ETHILON 2 0 PS N (SUTURE) ×2 IMPLANT
SUT ETHILON 3 0 PS 1 (SUTURE) ×2 IMPLANT
SUT NOVA 1 T20/GS 25DT (SUTURE) IMPLANT
SUT PDS AB 1 CTX 36 (SUTURE) IMPLANT
SUT PDS AB 1 TP1 96 (SUTURE) ×4 IMPLANT
SUT PROLENE 0 CT 1 CR/8 (SUTURE) IMPLANT
SUT SILK 0 SH 30 (SUTURE) ×8 IMPLANT
SUT SILK 2 0 SH CR/8 (SUTURE) ×2 IMPLANT
SUT SILK 3 0 (SUTURE) ×3
SUT SILK 3-0 18XBRD TIE 12 (SUTURE) IMPLANT
SUT VIC AB 2-0 CT1 27 (SUTURE)
SUT VIC AB 2-0 CT1 27XBRD (SUTURE) IMPLANT
SUT VIC AB 3-0 SH 27 (SUTURE)
SUT VIC AB 3-0 SH 27XBRD (SUTURE) IMPLANT
SYR CONTROL 10ML LL (SYRINGE) IMPLANT
TAPE CLOTH SURG 6X10 WHT LF (GAUZE/BANDAGES/DRESSINGS) ×2 IMPLANT
TOWEL OR 17X26 10 PK STRL BLUE (TOWEL DISPOSABLE) ×3 IMPLANT
TRAY FOLEY CATH 14FRSI W/METER (CATHETERS) ×2 IMPLANT

## 2014-10-04 NOTE — Anesthesia Procedure Notes (Signed)
Procedure Name: Intubation Performed by: Glory Buff Pre-anesthesia Checklist: Patient identified, Emergency Drugs available, Suction available and Patient being monitored Patient Re-evaluated:Patient Re-evaluated prior to inductionOxygen Delivery Method: Circle System Utilized Preoxygenation: Pre-oxygenation with 100% oxygen Intubation Type: IV induction, Rapid sequence and Cricoid Pressure applied Ventilation: Mask ventilation without difficulty Laryngoscope Size: Mac and 3 Grade View: Grade I Tube type: Oral Number of attempts: 1 Airway Equipment and Method: Stylet Placement Confirmation: ETT inserted through vocal cords under direct vision,  positive ETCO2 and breath sounds checked- equal and bilateral Secured at: 21 cm Tube secured with: Tape Dental Injury: Teeth and Oropharynx as per pre-operative assessment

## 2014-10-04 NOTE — Op Note (Signed)
10/02/2014 - 10/04/2014  12:28 PM  PATIENT:  [name]  74 y.o. female  PRE-OPERATIVE DIAGNOSIS:  Diaphragmatic Hernia  POST-OPERATIVE DIAGNOSIS:  Hiatal HERNIA  PROCEDURE:  Procedure(s): GASTROSTOMY (N/A) DIAPHRAGMATIC HERNIA REPAIR (N/A)  SURGEON:  Surgeon(s) and Role:    * [name], MD - Primary    * Jackolyn Confer, MD - Assisting  PHYSICIAN ASSISTANT:   ASSISTANTS: Dr. Zella Richer   ANESTHESIA:   general  EBL:  Total I/O In: 1000 [I.V.:1000] Out: 200 [Urine:200]  BLOOD ADMINISTERED:none  DRAINS: (1) Jackson-Pratt drain(s) with closed bulb suction in the paraesophageal space   LOCAL MEDICATIONS USED:  NONE  SPECIMEN:  Source of Specimen:  hernia sac  DISPOSITION OF SPECIMEN:  PATHOLOGY  COUNTS:  YES  TOURNIQUET:  * No tourniquets in log *  DICTATION: .Dragon Dictation  After informed consent was obtained the patient was brought to the operating room and placed in the supine position on the operating room table. After adequate induction of general anesthesia the patient's abdomen was prepped with ChloraPrep, allowed to dry, and draped in usual sterile manner. An upper midline incision was made with a 10 blade knife. This incision was carried through the skin and subcutaneous tissue sharply with electrocautery until the linea alba was identified. The linea alba was incised with the electrocautery and the preperitoneal space was probed bluntly with a hemostat until the peritoneum was opened and access was gained to the abdominal cavity. The rest of the incision was opened under direct vision with the electrocautery. The abdomen was generally inspected. We were able to identify the hernia that appeared to be going into the chest through the hiatus. First the falciform ligament was divided between hemostats and ligated with 2-0 silk ties. The rest of the falciform was taken down sharply with electrocautery. An Omni-Tract retractor was then deployed the upper abdomen was  retracted upward and outward. We were able to reduce the stomach out of the chest. We then opened the gastrohepatic ligament and were able to identify the esophagus with the vagus nerves. We dissected around the esophagus bluntly and surrounded the esophagus with a Penrose drain. We then dissected out the right crus. We then identified anteriorly and to the left the edges of the hernia sac. The edges of the sac were divided sharply with the Harmonic scalpel. With gentle traction we were able to reduce the sac and bluntly push the pleura away from it. Once the sac was removed it was sent to pathology for further evaluation. Next the short gastrics were taken down sharply with the Harmonic scalpel. This dissection was carried all the way to the left crus. Care was taken to avoid any injury to the spleen or pancreas. A couple of the short gastrics which were larger were also controlled with clips. The attachments to the stomach posteriorly were also excised sharply with the Harmonic scalpel. Once this was accomplished the stomach was completely free and there were no more attachments pulling the stomach back towards the chest. The esophagus was also dissected bluntly for a few centimeters so that with no tension on the stomach and esophagus there was no bend of the stomach or esophagus back through the hiatus. Next the right and left crus were reapproximated with 4-0 silk stitches. The small stab incision was made on the right upper quadrant with a 15 blade knife. A tonsil clamp was placed through this opening into the abdominal cavity and used to bring a 19 [place] round Blake drain  into the abdominal cavity. The drain was placed posterior to the esophagus through the opening in the crus and into the paraesophageal space where the hernia was. The drain was anchored to the skin with a 3-0 nylon stitch. Next a 2-0 silk pursestring stitch was placed on the anterior surface of the stomach proximally. A site was chosen to  bring the gastrostomy tube out on the left upper quadrant and a stab incision with a 10 blade knife was made in this area. A Kelly clamp was placed bluntly through this opening into the abdominal cavity and used to bring a 28 [place] Malincrodt drain through the abdominal wall. An opening was made in the stomach in the center of the pursestring stitch sharply with the electrocautery. The drain was then placed through this opening into the lumen of the stomach. The 2-0 silk pursestring stitch was cinched down and tied. 4 2-0 silk stitches were then used to anchor the stomach circumferentially to the abdominal wall around the gastrostomy tube. The gastrostomy tube was pulled up snug against the abdominal wall. The gastrostomy tube was anchored to the skin with a 2-0 nylon stitch. Next the abdomen was irrigated with copious amounts of saline. The fascia of the abdominal wall was then closed with 2 running #1 double-stranded PDS sutures. The subcutaneous tissue was irrigated with copious amounts of saline. The skin was closed with staples. Sterile dressings were applied. The Blake drain was placed to bulb suction and there was a good seal. The gastrostomy tube was placed to Foley bag drainage. The patient tolerated the procedure well. At the end of the case all needle sponge and instrument counts were correct. The patient was then awakened and taken to recovery in stable condition.  PLAN OF CARE: Admit to inpatient   PATIENT DISPOSITION:  PACU - hemodynamically stable.   Delay start of Pharmacological VTE agent (>24hrs) due to surgical blood loss or risk of bleeding: no

## 2014-10-04 NOTE — Anesthesia Preprocedure Evaluation (Addendum)
Anesthesia Evaluation  Patient identified by MRN, date of birth, ID band Patient awake    Reviewed: Allergy & Precautions, NPO status , Patient's Chart, lab work & pertinent test results  Airway Mallampati: III  TM Distance: >3 FB Neck ROM: Full    Dental no notable dental hx. (+) Teeth Intact   Pulmonary  breath sounds clear to auscultation  Pulmonary exam normal       Cardiovascular hypertension, Pt. on medications Rhythm:Regular Rate:Normal     Neuro/Psych  Neuromuscular disease negative neurological ROS  negative psych ROS   GI/Hepatic Neg liver ROS, Diaphragmatic hernia   Endo/Other  negative endocrine ROS  Renal/GU Renal InsufficiencyRenal disease  negative genitourinary   Musculoskeletal negative musculoskeletal ROS (+)   Abdominal   Peds  Hematology   Anesthesia Other Findings   Reproductive/Obstetrics negative OB ROS                            Anesthesia Physical Anesthesia Plan  ASA: II  Anesthesia Plan: General   Post-op Pain Management:    Induction: Intravenous and Cricoid pressure planned  Airway Management Planned: Oral ETT  Additional Equipment:   Intra-op Plan:   Post-operative Plan: Extubation in OR  Informed Consent: I have reviewed the patients History and Physical, chart, labs and discussed the procedure including the risks, benefits and alternatives for the proposed anesthesia with the patient or authorized representative who has indicated his/her understanding and acceptance.   Dental advisory given  Plan Discussed with: CRNA, Anesthesiologist and Surgeon  Anesthesia Plan Comments:         Anesthesia Quick Evaluation

## 2014-10-04 NOTE — Clinical Documentation Improvement (Signed)
  H&P states "Acute on chronic renal insufficiency". In the Coding world "acute renal insufficiency" equates to "disorder of kidney and ureter". If this is not what you intended please clarify the term "acute renal insufficiency" to better reflect this patient's severity of illness and risk of mortality.  Possible Conditions -- AKI on CKD (please add Stage) -- ARF on CKD (please add Stage) -- Other condition -- Not able to clinically determine  . Acute renal insufficiency and acute kidney disease are not reported as acute renal failure  Supporting Information -- Cr:  4/5 1.2,  4/6 1.18 -- GFR:  4/5 44/51,  4/6 45/52   -- D5 NS 100 ml/hr -- monitoring renal function  Thank Sheral Flow RN [place] HIM department

## 2014-10-04 NOTE — Anesthesia Postprocedure Evaluation (Signed)
  Anesthesia Post-op Note  Patient: [name]  Procedure(s) Performed: Procedure(s): GASTROSTOMY (N/A) DIAPHRAGMATIC HERNIA REPAIR (N/A)  Patient Location: PACU  Anesthesia Type:General  Level of Consciousness: awake, alert  and oriented  Airway and Oxygen Therapy: Patient Spontanous Breathing and Patient connected to nasal cannula oxygen  Post-op Pain: mild  Post-op Assessment: Post-op Vital signs reviewed, Patient's Cardiovascular Status Stable, Respiratory Function Stable, Patent Airway, No signs of Nausea or vomiting and Pain level controlled  Post-op Vital Signs: Reviewed and stable  Last Vitals:  Filed Vitals:   10/04/14 1300  BP: 135/83  Pulse: 71  Temp:   Resp: 15    Complications: No apparent anesthesia complications

## 2014-10-04 NOTE — Transfer of Care (Signed)
Immediate Anesthesia Transfer of Care Note  Patient: [name]  Procedure(s) Performed: Procedure(s): GASTROSTOMY (N/A) DIAPHRAGMATIC HERNIA REPAIR (N/A)  Patient Location: PACU  Anesthesia Type:General  Level of Consciousness: awake, alert  and oriented  Airway & Oxygen Therapy: Patient Spontanous Breathing and Patient connected to face mask oxygen  Post-op Assessment: Report given to RN and Post -op Vital signs reviewed and stable  Post vital signs: Reviewed and stable  Last Vitals:  Filed Vitals:   10/04/14 0450  BP: 125/66  Pulse: 73  Temp: 37.2 C  Resp: 16    Complications: No apparent anesthesia complications

## 2014-10-05 ENCOUNTER — Encounter (HOSPITAL_COMMUNITY): Payer: Self-pay | Admitting: General Surgery

## 2014-10-05 LAB — URINE CULTURE
Colony Count: >=100000
Special Requests: NORMAL

## 2014-10-05 MED ORDER — ACETAMINOPHEN 10 MG/ML IV SOLN
1000.0000 mg | Freq: Four times a day (QID) | INTRAVENOUS | Status: AC
  Administered 2014-10-05 – 2014-10-06 (×4): 1000 mg via INTRAVENOUS
  Filled 2014-10-05 (×5): qty 100

## 2014-10-05 MED ORDER — CEFTRIAXONE SODIUM IN DEXTROSE 20 MG/ML IV SOLN
1.0000 g | INTRAVENOUS | Status: DC
  Administered 2014-10-05 – 2014-10-07 (×3): 1 g via INTRAVENOUS
  Filled 2014-10-05 (×4): qty 50

## 2014-10-05 MED ORDER — CEFTRIAXONE SODIUM IN DEXTROSE 40 MG/ML IV SOLN
2.0000 g | INTRAVENOUS | Status: DC
  Filled 2014-10-05: qty 50

## 2014-10-05 NOTE — Progress Notes (Signed)
1 Day Post-Op  Subjective: She is ready for some ice chips and ginger ale.  Sites look fine, no BS, or flatus.  Not allot of drainage from G tube or drain.  Objective: Vital signs in last 24 hours: Temp:  [97.5 F (36.4 C)-99.1 F (37.3 C)] 99.1 F (37.3 C) (04/08 0600) Pulse Rate:  [61-91] 91 (04/08 0600) Resp:  [10-18] 18 (04/08 0600) BP: (135-182)/(68-91) 151/68 mmHg (04/08 0600) SpO2:  [96 %-100 %] 96 % (04/08 0600) Last BM Date: 09/30/14 380 from the drains NPO  835 urine. Afebrile, VSS No labs  Intake/Output from previous day: 04/07 0701 - 04/08 0700 In: 3323.3 [I.V.:3323.3] Out: 1265 [Urine:835; Drains:380; Blood:50] Intake/Output this shift: Total I/O In: -  Out: 125 [Urine:125]  General appearance: alert, cooperative and no distress Resp: clear to auscultation bilaterally GI: soft, sore, sites look fine, no BS, no flatus  Lab Results:   Recent Labs  10/02/14 1032 10/03/14 0507  WBC 10.2 12.0*  HGB 13.9 11.9*  HCT 43.1 37.9  PLT 396 353    BMET  Recent Labs  10/02/14 1032 10/03/14 0507  NA 144 148*  K 3.5 4.0  CL 105 113*  CO2 27 29  GLUCOSE 143* 116*  BUN 18 17  CREATININE 1.20* 1.18*  CALCIUM 9.7 8.8   PT/INR No results for input(s): LABPROT, INR in the last 72 hours.   Recent Labs Lab 10/02/14 1032  AST 28  ALT 18  ALKPHOS 58  BILITOT 0.7  PROT 8.1  ALBUMIN 4.3     Lipase     Component Value Date/Time   LIPASE 101* 10/02/2014 1032     Studies/Results: Dg Chest Port 1 View  10/04/2014   CLINICAL DATA:  Diaphragmatic hernia repair.  EXAM: PORTABLE CHEST - 1 VIEW  COMPARISON:  03/03/2014.  FINDINGS: Cardiopericardial silhouette within normal limits for projection. Retrocardiac density is present, probably representing atelectasis associated with previous intrathoracic stomach and diaphragmatic hernia. Laparotomy staples are present. Surgical clips in the upper abdomen. Probable surgical drain in the LEFT upper quadrant. No  pneumothorax. No pleural effusion is identified.  IMPRESSION: Postoperative changes at the LEFT hemidiaphragm. No pneumothorax or complicating features.   Electronically Signed   By: [name] M.D.   On: 10/04/2014 13:37    Medications: . antiseptic oral rinse  7 mL Mouth Rinse q12n4p  . chlorhexidine  15 mL Mouth Rinse BID  . heparin  5,000 Units Subcutaneous 3 times per day  . pantoprazole (PROTONIX) IV  40 mg Intravenous Daily   . dextrose 5 % and 0.9 % NaCl with KCl 20 mEq/L 100 mL/hr at 10/05/14 0226    ESCHERICHIA COLI   Antibiotic Sensitivity Microscan Status  AMPICILLIN Sensitive 8 SENSITIVE Final  Method: MIC  CEFAZOLIN Sensitive <=4 SENSITIVE Final  Method: MIC  CEFTRIAXONE Sensitive <=1 SENSITIVE Final  Method: MIC  CIPROFLOXACIN Sensitive <=0.25 SENSITIVE Final  Method: MIC  GENTAMICIN Sensitive <=1 SENSITIVE Final  Method: MIC  LEVOFLOXACIN Sensitive <=0.12 SENSITIVE Final  Method: MIC  NITROFURANTOIN Sensitive 32 SENSITIVE Final  Method: MIC  PIP/TAZO Sensitive <=4 SENSITIVE Final  Method: MIC  TOBRAMYCIN Sensitive <=1 SENSITIVE Final  Method: MIC  TRIMETH/SULFA Sensitive <=20 SENSITIVE Final  Method: MIC  Comments ESCHERICHIA COLI (MIC)    ESCHERICHIA COLI       Assessment/Plan Hiatal HERNIA S/p GASTROSTOMY, & DIAPHRAGMATIC HERNIA REPAIR, 10/04/14, Dr. Marlou Silva Acute on chronic renal insufficiency(baseline 1.19 7 months ago) Hypertension Heparin/SCD for DVT prophylaxis No antibiotics  UTI - starting Ceftriaxone 10/05/14 for 7 days.  Plan:  I have started her on rocephin for her UTI, I will give her some ice chips with flavor, she is ambulating.  Add IS.  Wait on bowel function to return.  IV tylenol for pain.       LOS: 3 days    [name] 10/05/2014

## 2014-10-06 LAB — CBC
HCT: 38.1 % (ref 36.0–46.0)
Hemoglobin: 12.5 g/dL (ref 12.0–15.0)
MCH: 29.3 pg (ref 26.0–34.0)
MCHC: 32.8 g/dL (ref 30.0–36.0)
MCV: 89.2 fL (ref 78.0–100.0)
Platelets: 360 10*3/uL (ref 150–400)
RBC: 4.27 MIL/uL (ref 3.87–5.11)
RDW: 14.8 % (ref 11.5–15.5)
WBC: 9.7 10*3/uL (ref 4.0–10.5)

## 2014-10-06 LAB — BASIC METABOLIC PANEL
ANION GAP: 4 — AB (ref 5–15)
BUN: 9 mg/dL (ref 6–23)
CHLORIDE: 113 mmol/L — AB (ref 96–112)
CO2: 23 mmol/L (ref 19–32)
Calcium: 8.2 mg/dL — ABNORMAL LOW (ref 8.4–10.5)
Creatinine, Ser: 1.02 mg/dL (ref 0.50–1.10)
GFR calc Af Amer: 62 mL/min — ABNORMAL LOW (ref >90)
GFR, EST NON AFRICAN AMERICAN: 54 mL/min — AB (ref >90)
GLUCOSE: 116 mg/dL — AB (ref 70–99)
Potassium: 3.5 mmol/L (ref 3.5–5.1)
Sodium: 140 mmol/L (ref 135–145)

## 2014-10-06 NOTE — Progress Notes (Signed)
Patient ID: [name], female   DOB: 1940/08/11, 74 y.o.   MRN: [id number]  [facility]  POD#: 2  Subjective: Patient up in chair.  Wants to eat and drink.  Ambulatory.  No flatus.  Denies nausea.  Objective: Vital signs in last 24 hours: Temp:  [97.7 F (36.5 C)-98.5 F (36.9 C)] 98.5 F (36.9 C) (04/09 0600) Pulse Rate:  [71-84] 76 (04/09 0600) Resp:  [18] 18 (04/09 0600) BP: (147-164)/(68-91) 153/69 mmHg (04/09 0942) SpO2:  [96 %-99 %] 98 % (04/09 0600) Weight:  [83.3 kg (183 lb 10.3 oz)] 83.3 kg (183 lb 10.3 oz) (04/08 1000) Last BM Date: 09/30/14  Intake/Output from previous day: 04/08 0701 - 04/09 0700 In: 1200 [I.V.:1200] Out: 2643 [Urine:2075; Drains:568] Intake/Output this shift:    Physical Exam: HEENT - sclerae clear, mucous membranes moist Neck - soft Chest - clear bilaterally Cor - RRR Abdomen - soft, dressings dry and intact; G-tube with bilious in drainage bag; JP with serosanguinous in bulb; no BS present Ext - no edema, non-tender Neuro - alert & oriented, no focal deficits  Lab Results:   Recent Labs  10/06/14 0600  WBC 9.7  HGB 12.5  HCT 38.1  PLT 360   BMET  Recent Labs  10/06/14 0600  NA 140  K 3.5  CL 113*  CO2 23  GLUCOSE 116*  BUN 9  CREATININE 1.02  CALCIUM 8.2*   PT/INR No results for input(s): LABPROT, INR in the last 72 hours. Comprehensive Metabolic Panel:    Component Value Date/Time   NA 140 10/06/2014 0600   NA 148* 10/03/2014 0507   K 3.5 10/06/2014 0600   K 4.0 10/03/2014 0507   CL 113* 10/06/2014 0600   CL 113* 10/03/2014 0507   CO2 23 10/06/2014 0600   CO2 29 10/03/2014 0507   BUN 9 10/06/2014 0600   BUN 17 10/03/2014 0507   CREATININE 1.02 10/06/2014 0600   CREATININE 1.18* 10/03/2014 0507   GLUCOSE 116* 10/06/2014 0600   GLUCOSE 116* 10/03/2014 0507   CALCIUM 8.2* 10/06/2014 0600   CALCIUM 8.8 10/03/2014 0507   AST 28 10/02/2014 1032   AST 20 03/03/2014 0133   ALT 18 10/02/2014 1032   ALT 15 03/03/2014 0133   ALKPHOS 58 10/02/2014 1032   ALKPHOS 68 03/03/2014 0133   BILITOT 0.7 10/02/2014 1032   BILITOT 0.3 03/03/2014 0133   PROT 8.1 10/02/2014 1032   PROT 7.1 03/03/2014 0133   ALBUMIN 4.3 10/02/2014 1032   ALBUMIN 3.2* 03/03/2014 0133    Studies/Results: Dg Chest Port 1 View  10/04/2014   CLINICAL DATA:  Diaphragmatic hernia repair.  EXAM: PORTABLE CHEST - 1 VIEW  COMPARISON:  03/03/2014.  FINDINGS: Cardiopericardial silhouette within normal limits for projection. Retrocardiac density is present, probably representing atelectasis associated with previous intrathoracic stomach and diaphragmatic hernia. Laparotomy staples are present. Surgical clips in the upper abdomen. Probable surgical drain in the LEFT upper quadrant. No pneumothorax. No pleural effusion is identified.  IMPRESSION: Postoperative changes at the LEFT hemidiaphragm. No pneumothorax or complicating features.   Electronically Signed   By: Dereck Ligas M.D.   On: 10/04/2014 13:37    Anti-infectives: Anti-infectives    Start     Dose/Rate Route Frequency Ordered Stop   10/05/14 1030  cefTRIAXone (ROCEPHIN) 2 g in dextrose 5 % 50 mL IVPB - Premix  Status:  Discontinued     2 g 100 mL/hr over 30 Minutes Intravenous Every  24 hours 10/05/14 0948 10/05/14 0954   10/05/14 1030  cefTRIAXone (ROCEPHIN) 1 g in dextrose 5 % 50 mL IVPB - Premix     1 g 100 mL/hr over 30 Minutes Intravenous Every 24 hours 10/05/14 0954 10/12/14 1029   10/04/14 1030  ceFAZolin (ANCEF) IVPB 2 g/50 mL premix     2 g 100 mL/hr over 30 Minutes Intravenous To Surgery 10/04/14 1011 10/04/14 1022      Assessment & Plans: Status post HH repair, gastrostomy placement  Continue ice chips for now  Gastrostomy to drainage  Await resolution of ileus  Encouraged ambulation  IV hydration   Earnstine Regal, MD, [facility] Office: [place] 10/06/2014

## 2014-10-07 NOTE — Progress Notes (Signed)
Patient ID: [name], female   DOB: 1940/07/13, 74 y.o.   MRN: [id number]  [facility]  POD#: 3  Subjective: Patient doing very well.  Ambulating multiple times.  Denies pain or nausea.  Small output from G tube drainage.  Objective: Vital signs in last 24 hours: Temp:  [99.1 F (37.3 C)-99.4 F (37.4 C)] 99.2 F (37.3 C) (04/10 0533) Pulse Rate:  [81-96] 81 (04/10 0533) Resp:  [18] 18 (04/10 0533) BP: (142-153)/(68-78) 146/78 mmHg (04/10 0533) SpO2:  [97 %-100 %] 99 % (04/10 0533) Last BM Date: 10/01/14  Intake/Output from previous day: 04/09 0701 - 04/10 0700 In: 3600 [I.V.:3600] Out: 2227 [Urine:1800; Drains:427] Intake/Output this shift:    Physical Exam: HEENT - sclerae clear, mucous membranes moist Neck - soft Chest - clear bilaterally Cor - RRR Abdomen - soft; wound dry and intact; serosang in JP drain; G tube with small bilious in drainage bag Ext - no edema, non-tender Neuro - alert & oriented, no focal deficits  Lab Results:   Recent Labs  10/06/14 0600  WBC 9.7  HGB 12.5  HCT 38.1  PLT 360   BMET  Recent Labs  10/06/14 0600  NA 140  K 3.5  CL 113*  CO2 23  GLUCOSE 116*  BUN 9  CREATININE 1.02  CALCIUM 8.2*   PT/INR No results for input(s): LABPROT, INR in the last 72 hours. Comprehensive Metabolic Panel:    Component Value Date/Time   NA 140 10/06/2014 0600   NA 148* 10/03/2014 0507   K 3.5 10/06/2014 0600   K 4.0 10/03/2014 0507   CL 113* 10/06/2014 0600   CL 113* 10/03/2014 0507   CO2 23 10/06/2014 0600   CO2 29 10/03/2014 0507   BUN 9 10/06/2014 0600   BUN 17 10/03/2014 0507   CREATININE 1.02 10/06/2014 0600   CREATININE 1.18* 10/03/2014 0507   GLUCOSE 116* 10/06/2014 0600   GLUCOSE 116* 10/03/2014 0507   CALCIUM 8.2* 10/06/2014 0600   CALCIUM 8.8 10/03/2014 0507   AST 28 10/02/2014 1032   AST 20 03/03/2014 0133   ALT 18 10/02/2014 1032   ALT 15 03/03/2014 0133   ALKPHOS 58  10/02/2014 1032   ALKPHOS 68 03/03/2014 0133   BILITOT 0.7 10/02/2014 1032   BILITOT 0.3 03/03/2014 0133   PROT 8.1 10/02/2014 1032   PROT 7.1 03/03/2014 0133   ALBUMIN 4.3 10/02/2014 1032   ALBUMIN 3.2* 03/03/2014 0133    Studies/Results: No results found.  Anti-infectives: Anti-infectives    Start     Dose/Rate Route Frequency Ordered Stop   10/05/14 1030  cefTRIAXone (ROCEPHIN) 2 g in dextrose 5 % 50 mL IVPB - Premix  Status:  Discontinued     2 g 100 mL/hr over 30 Minutes Intravenous Every 24 hours 10/05/14 0948 10/05/14 0954   10/05/14 1030  cefTRIAXone (ROCEPHIN) 1 g in dextrose 5 % 50 mL IVPB - Premix     1 g 100 mL/hr over 30 Minutes Intravenous Every 24 hours 10/05/14 0954 10/12/14 1029   10/04/14 1030  ceFAZolin (ANCEF) IVPB 2 g/50 mL premix     2 g 100 mL/hr over 30 Minutes Intravenous To Surgery 10/04/14 1011 10/04/14 1022      Assessment & Plans: Status post HH repair, gastrostomy placement Begin clear liquid diet Gastrostomy tube clamped - irrigate every shift Encouraged ambulation IV rate decreased  Earnstine Regal, MD, [facility] Office: [place]  M 10/07/2014

## 2014-10-08 MED ORDER — PANTOPRAZOLE SODIUM 40 MG PO TBEC
40.0000 mg | DELAYED_RELEASE_TABLET | Freq: Every day | ORAL | Status: DC
  Administered 2014-10-08: 40 mg via ORAL
  Filled 2014-10-08 (×2): qty 1

## 2014-10-08 NOTE — Progress Notes (Signed)
Pt would like something ordered to night to help her sleep.

## 2014-10-08 NOTE — Progress Notes (Signed)
Patient ID: [name], female   DOB: 1940-12-15, 74 y.o.   MRN: [id number]  [facility]  POD#: 4  Subjective: Patient up at bedside, family in room.  Doing well.  No complaints.  Wants to shower.  Tolerating liquid diet.  Objective: Vital signs in last 24 hours: Temp:  [98.8 F (37.1 C)-98.9 F (37.2 C)] 98.9 F (37.2 C) (04/11 0625) Pulse Rate:  [75-89] 75 (04/11 0625) Resp:  [18] 18 (04/11 0625) BP: (148-154)/(76-89) 152/86 mmHg (04/11 0625) SpO2:  [98 %-99 %] 98 % (04/11 0625) Last BM Date: 10/01/14  Intake/Output from previous day: 04/10 0701 - 04/11 0700 In: 1355.8 [I.V.:1325.8] Out: 1470 [Urine:1350; Drains:120] Intake/Output this shift:    Physical Exam: HEENT - sclerae clear, mucous membranes moist Neck - soft Chest - clear bilaterally Cor - RRR Abdomen - soft, wounds dry and intact; JP with small serous output Ext - no edema, non-tender Neuro - alert & oriented, no focal deficits  Lab Results:   Recent Labs  10/06/14 0600  WBC 9.7  HGB 12.5  HCT 38.1  PLT 360   BMET  Recent Labs  10/06/14 0600  NA 140  K 3.5  CL 113*  CO2 23  GLUCOSE 116*  BUN 9  CREATININE 1.02  CALCIUM 8.2*   PT/INR No results for input(s): LABPROT, INR in the last 72 hours. Comprehensive Metabolic Panel:    Component Value Date/Time   NA 140 10/06/2014 0600   NA 148* 10/03/2014 0507   K 3.5 10/06/2014 0600   K 4.0 10/03/2014 0507   CL 113* 10/06/2014 0600   CL 113* 10/03/2014 0507   CO2 23 10/06/2014 0600   CO2 29 10/03/2014 0507   BUN 9 10/06/2014 0600   BUN 17 10/03/2014 0507   CREATININE 1.02 10/06/2014 0600   CREATININE 1.18* 10/03/2014 0507   GLUCOSE 116* 10/06/2014 0600   GLUCOSE 116* 10/03/2014 0507   CALCIUM 8.2* 10/06/2014 0600   CALCIUM 8.8 10/03/2014 0507   AST 28 10/02/2014 1032   AST 20 03/03/2014 0133   ALT 18 10/02/2014 1032   ALT 15 03/03/2014 0133   ALKPHOS 58 10/02/2014 1032   ALKPHOS 68 03/03/2014  0133   BILITOT 0.7 10/02/2014 1032   BILITOT 0.3 03/03/2014 0133   PROT 8.1 10/02/2014 1032   PROT 7.1 03/03/2014 0133   ALBUMIN 4.3 10/02/2014 1032   ALBUMIN 3.2* 03/03/2014 0133    Studies/Results: No results found.  Anti-infectives: Anti-infectives    Start     Dose/Rate Route Frequency Ordered Stop   10/05/14 1030  cefTRIAXone (ROCEPHIN) 2 g in dextrose 5 % 50 mL IVPB - Premix  Status:  Discontinued     2 g 100 mL/hr over 30 Minutes Intravenous Every 24 hours 10/05/14 0948 10/05/14 0954   10/05/14 1030  cefTRIAXone (ROCEPHIN) 1 g in dextrose 5 % 50 mL IVPB - Premix  Status:  Discontinued     1 g 100 mL/hr over 30 Minutes Intravenous Every 24 hours 10/05/14 0954 10/08/14 0904   10/04/14 1030  ceFAZolin (ANCEF) IVPB 2 g/50 mL premix     2 g 100 mL/hr over 30 Minutes Intravenous To Surgery 10/04/14 1011 10/04/14 1022      Assessment & Plans: Status post ex lap with hiatal hernia repair, gastrostomy tube placement  Advance to soft diet  PO meds  Heplock IV  May shower  Likely remove JP drain in AM 4/12  Probably home tomorrow - instruct  in gastrostomy care  Earnstine Regal, MD, [facility] Office: [place] 10/08/2014

## 2014-10-09 ENCOUNTER — Encounter (HOSPITAL_COMMUNITY): Payer: Self-pay | Admitting: General Surgery

## 2014-10-09 DIAGNOSIS — I10 Essential (primary) hypertension: Secondary | ICD-10-CM

## 2014-10-09 DIAGNOSIS — N189 Chronic kidney disease, unspecified: Secondary | ICD-10-CM

## 2014-10-09 DIAGNOSIS — E860 Dehydration: Secondary | ICD-10-CM | POA: Diagnosis present

## 2014-10-09 HISTORY — DX: Dehydration: E86.0

## 2014-10-09 HISTORY — DX: Chronic kidney disease, unspecified: N18.9

## 2014-10-09 HISTORY — DX: Essential (primary) hypertension: I10

## 2014-10-09 MED ORDER — ACETAMINOPHEN 325 MG PO TABS
650.0000 mg | ORAL_TABLET | Freq: Four times a day (QID) | ORAL | Status: DC | PRN
Start: 2014-10-09 — End: 2016-03-06

## 2014-10-09 MED ORDER — HYDROCODONE-ACETAMINOPHEN 5-325 MG PO TABS: 2.0000 | ORAL_TABLET | ORAL | Status: DC | PRN

## 2014-10-09 NOTE — Progress Notes (Signed)
Nurse reviewed discharge instructions with pt.  Pt verbalized understanding of discharge instructions, follow up appointments and new medication.  Pt and her husband showed by nurse how to irrigate pt's G tube.  Pt's husband demonstrated irrigation with no problem and feels comfortable irrigating twice a day.  No concerns at time of discharge. Prescription given to pt prior to discharge.

## 2014-10-09 NOTE — Discharge Summary (Signed)
Physician Discharge Summary  Patient ID: [name] MRN: [id number] DOB/AGE: April 13, 1941 74 y.o.  Admit date: 10/02/2014 Discharge date: 10/09/2014  Admission Diagnoses:  GOO with incarcerated Bochdalek's hernia Acute on chronic renal insufficiency(baseline 1.19 7 months ago) Dehydration Hypertension   Discharge Diagnoses:  GOO with incarcerated Bochdalek's hernia Chronic renal insufficiency - back to normal Hypertension  Principal Problem:   Bochdalek hernia Active Problems:   Dehydration   Chronic renal insufficiency   Essential hypertension   PROCEDURES: GASTROSTOMY, & DIAPHRAGMATIC HERNIA REPAIR, 10/04/14, Dr. Alden Benjamin Course:  [name] is a 74 year old female with a history of obesity, hypertension, diverticulosis s/p partial colectomy, known hiatal hernia since 2012 presenting with abdominal pain, nausea and vomiting. Duration of symptoms is 1 day. Onset was sudden around 10PM last night. Severe in severity. Time pattern is constant. Associated with chills and sweats, loose stools. Location of pain is epigastric region. No modifying factors. No aggravating or alleviating factors. Denies previous symptoms. CT of abdomen and pelvis significant for a gastric outlet obstruction from an incarcerated Bochdalek's hernia. We have therefore been asked to evaluate. She is afebrile, normal white count. sCr 1.2, lactic acid 2.2. She has been NPO since last night She was admitted and decompressed.  Dr. Marlou Starks discussed risk and benefits of surgery and she was taken to the OR on 10/04/14. Post op she has done quite well.  Her diet has been advanced and she was ready for discharge home on 10/09/14. She has been instructed in care of her G tube, and we will pull the drain today before she goes.  Condition on D/c:  Improved   Disposition: 01-Home or Self Care     Medication List    TAKE these medications        acetaminophen 325 MG tablet  Commonly known as:  TYLENOL   Take 2 tablets (650 mg total) by mouth every 6 (six) hours as needed for mild pain, moderate pain, fever or headache.     amLODipine-benazepril 5-20 MG per capsule  Commonly known as:  LOTREL  Take 1 capsule by mouth daily.     aspirin EC 81 MG tablet  Take 81 mg by mouth daily.     furosemide 40 MG tablet  Commonly known as:  LASIX  Take 40 mg by mouth.     HYDROcodone-acetaminophen 5-325 MG per tablet  Commonly known as:  NORCO  Take 2 tablets by mouth every 4 (four) hours as needed.     omeprazole 20 MG capsule  Commonly known as:  PRILOSEC  Take 1 capsule (20 mg total) by mouth 2 (two) times daily.       Follow-up Information    Follow up with [name], MD.   Specialty:  Family Medicine   Why:  Call for follow up and let her know you had surgery.  Call her for blood pressure issues and medical issues.   Contact information:   [place] [address] [phone]       Follow up with Merrie Roof, MD On 10/15/2014.   Specialty:  General Surgery   Why:  you have a 1:30 PM appointment for staple removal with the nurse, be there 30 minutes early for check in.   Contact information:   [place] [address] [phone]       Follow up with Merrie Roof, MD.   Specialty:  General Surgery   Why:  Our  office should call you with an appointment, but if you don't hear from them by tomorrow, call and ask for an appointment in 2 weeks.   Contact information:   [address] [place] [address] [phone]       Signed: Earnstine Regal 10/09/2014, 8:21 AM

## 2014-10-09 NOTE — Progress Notes (Signed)
5 Days Post-Op  Subjective: She is doing well, multiple bowel movements, not really complaining of any pain.  Doing well with a soft diet.    Objective: Vital signs in last 24 hours: Temp:  [98.3 F (36.8 C)-99.3 F (37.4 C)] 99.3 F (37.4 C) (04/12 0500) Pulse Rate:  [73-85] 73 (04/12 0500) Resp:  [18] 18 (04/12 0500) BP: (142-159)/(71-80) 153/80 mmHg (04/12 0500) SpO2:  [98 %-99 %] 99 % (04/12 0500) Last BM Date: 10/01/14 1080 PO  Soft diet 15 ml from drain 2 stools Tm 99.3, VSS No films Intake/Output from previous day: 04/11 0701 - 04/12 0700 In: 0786 [P.O.:1080; I.V.:180] Out: 1115 [Urine:1100; Drains:15] Intake/Output this shift:    General appearance: alert, cooperative and no distress Resp: clear to auscultation bilaterally GI: soft, + BS, incision looks fine, clear drainage from the drain.  She knows how to irrigate G tube.  Lab Results:  No results for input(s): WBC, HGB, HCT, PLT in the last 72 hours.  BMET No results for input(s): NA, K, CL, CO2, GLUCOSE, BUN, CREATININE, CALCIUM in the last 72 hours. PT/INR No results for input(s): LABPROT, INR in the last 72 hours.   Recent Labs Lab 10/02/14 1032  AST 28  ALT 18  ALKPHOS 58  BILITOT 0.7  PROT 8.1  ALBUMIN 4.3     Lipase     Component Value Date/Time   LIPASE 101* 10/02/2014 1032     Studies/Results: No results found.  Medications: . antiseptic oral rinse  7 mL Mouth Rinse q12n4p  . chlorhexidine  15 mL Mouth Rinse BID  . heparin  5,000 Units Subcutaneous 3 times per day  . pantoprazole  40 mg Oral Daily    Assessment/Plan Hiatal HERNIA S/p GASTROSTOMY, & DIAPHRAGMATIC HERNIA REPAIR, 10/04/14, Dr. Marlou Starks chronic renal insufficiency - back to normal Hypertension Heparin/SCD for DVT prophylaxis No antibiotics UTI - starting Ceftriaxone 10/05/14 for 7 days.   Plan:  Home today, I will get her drain out today, have her set up for staple removal next week and then follow up with Dr. Marlou Starks  in 2 weeks.     LOS: 7 days    [name] 10/09/2014

## 2014-10-09 NOTE — Discharge Instructions (Signed)
[facility]      [facility] °[phone] ° °OPEN ABDOMINAL SURGERY: POST OP INSTRUCTIONS ° °Always review your discharge instruction sheet given to you by the facility where your surgery was performed. ° °IF YOU HAVE DISABILITY OR FAMILY LEAVE FORMS, YOU MUST BRING THEM TO THE OFFICE FOR PROCESSING.  PLEASE DO NOT GIVE THEM TO YOUR DOCTOR. ° °1. A prescription for pain medication may be given to you upon discharge.  Take your pain medication as prescribed, if needed.  If narcotic pain medicine is not needed, then you may take acetaminophen (Tylenol) or ibuprofen (Advil) as needed. °2. Take your usually prescribed medications unless otherwise directed. °3. If you need a refill on your pain medication, please contact your pharmacy. They will contact our office to request authorization.  Prescriptions will not be filled after 5pm or on week-ends. °4. You should follow a light diet the first few days after arrival home, such as soup and crackers, pudding, etc.unless your doctor has advised otherwise. A high-fiber, low fat diet can be resumed as tolerated.   Be sure to include lots of fluids daily. Most patients will experience some swelling and bruising on the chest and neck area.  Ice packs will help.  Swelling and bruising can take several days to resolve °5. Most patients will experience some swelling and bruising in the area of the incision. Ice pack will help. Swelling and bruising can take several days to resolve..  °6. It is common to experience some constipation if taking pain medication after surgery.  Increasing fluid intake and taking a stool softener will usually help or prevent this problem from occurring.  A mild laxative (Milk of Magnesia or Miralax) should be taken according to package directions if there are no bowel movements after 48 hours. °7.  You may have steri-strips (small skin tapes) in place directly over the incision.  These strips should be left on the skin for 7-10 days.  If your  surgeon used skin glue on the incision, you may shower in 24 hours.  The glue will flake off over the next 2-3 weeks.  Any sutures or staples will be removed at the office during your follow-up visit. You may find that a light gauze bandage over your incision may keep your staples from being rubbed or pulled. You may shower and replace the bandage daily. °8. ACTIVITIES:  You may resume regular (light) daily activities beginning the next day--such as daily self-care, walking, climbing stairs--gradually increasing activities as tolerated.  You may have sexual intercourse when it is comfortable.  Refrain from any heavy lifting or straining until approved by your doctor. °a. You may drive when you no longer are taking prescription pain medication, you can comfortably wear a seatbelt, and you can safely maneuver your car and apply brakes °b. Return to Work: ___________________________________ °9. You should see your doctor in the office for a follow-up appointment approximately two weeks after your surgery.  Make sure that you call for this appointment within a day or two after you arrive home to insure a convenient appointment time. °OTHER INSTRUCTIONS:  °_____________________________________________________________ °_____________________________________________________________ ° °WHEN TO CALL YOUR DOCTOR: °1. Fever over 101.0 °2. Inability to urinate °3. Nausea and/or vomiting °4. Extreme swelling or bruising °5. Continued bleeding from incision. °6. Increased pain, redness, or drainage from the incision. °7. Difficulty swallowing or breathing °8. Muscle cramping or spasms. °9. Numbness or tingling in hands or feet or around lips. ° °The clinic staff is available to   answer your questions during regular business hours.  Please dont hesitate to call and ask to speak to one of the nurses if you have concerns.  For further questions, please visit [email or web address] Blood Pressure Record Sheet Your blood  pressure on this visit to the emergency department or clinic is elevated. This does not necessarily mean you have high blood pressure (hypertension), but it does mean that your blood pressure needs to be rechecked. Many times your blood pressure can increase due to illness, pain, anxiety, or other factors. We recommend that you get a series of blood pressure readings done over a period of 5 days. It is best to get a reading in the morning and one in the evening. You should make sure to sit and relax for 1-5 minutes before the reading is taken. Write the readings down and make a follow-up appointment with your health care provider to discuss the results. If there is not a free clinic or a drug store with a blood-pressure-taking machine near you, you can purchase blood-pressure-taking equipment from a drug store. Having one in the home allows you the convenience of taking your blood pressure while you are home and relaxed.  Your blood pressure in the emergency department or clinic on ________ was ____________________. BLOOD PRESSURE LOG Date: _______________________  a.m. _____________________  p.m. _____________________ Date: _______________________  a.m. _____________________  p.m. _____________________ Date: _______________________  a.m. _____________________  p.m. _____________________ Date: _______________________  a.m. _____________________  p.m. _____________________ Date: _______________________  a.m. _____________________  p.m. _____________________  CAll Dr. Drema Dallas if your BP is down, or you feel dizzy standing up.   Document Released: 03/14/2003 Document Revised: 10/30/2013 Document Reviewed: 08/08/2013 [facility] Patient Information 2015 [place], [place]. This information is not intended to replace advice given to you by your health care provider. Make sure you discuss any questions you have with your health care provider.

## 2014-11-16 ENCOUNTER — Other Ambulatory Visit: Payer: Self-pay

## 2014-11-16 DIAGNOSIS — Z1231 Encounter for screening mammogram for malignant neoplasm of breast: Secondary | ICD-10-CM

## 2014-12-13 ENCOUNTER — Ambulatory Visit
Admission: RE | Admit: 2014-12-13 | Discharge: 2014-12-13 | Disposition: A | Discharge status: Home / Self Care | Payer: Medicare Other | Source: Ambulatory Visit

## 2014-12-13 DIAGNOSIS — Z1231 Encounter for screening mammogram for malignant neoplasm of breast: Secondary | ICD-10-CM

## 2014-12-24 ENCOUNTER — Other Ambulatory Visit: Payer: Self-pay

## 2015-06-30 HISTORY — PX: BREAST LUMPECTOMY: SHX2

## 2015-09-09 IMAGING — CT CT ABD-PELV W/ CM
1 of 3 series · 13 of 32 positions shown, 18 images · IV contrast (omnipaque)
Comparison: Abdominal pelvic CT 03/03/2014 and 03/16/2004.

CLINICAL DATA: Abdominal pain with nausea and vomiting for 1 day.
No fever. Initial encounter.

EXAM:
CT ABDOMEN AND PELVIS WITH CONTRAST
TECHNIQUE: Multidetector CT imaging of the abdomen and pelvis was performed
using the standard protocol following bolus administration of
intravenous contrast.
CONTRAST:  80mL OMNIPAQUE IOHEXOL 300 MG/ML  SOLN

[Series 2: abd/pel with · axial · 0.83mm/px · z∈[-411,-36]mm · 13 of 87 slices shown, 18 images]
[im 6/87  soft-tissue]
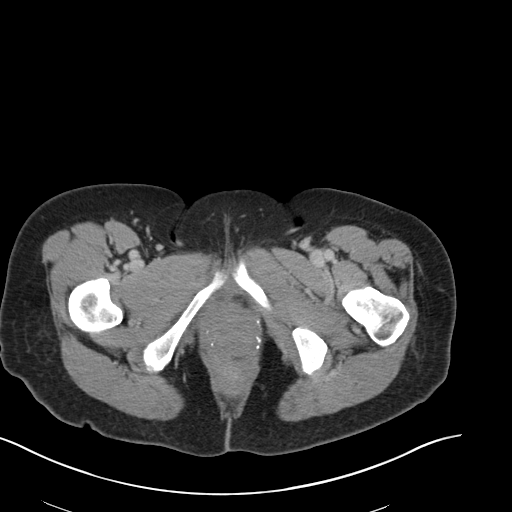
[im 6/87  bone]
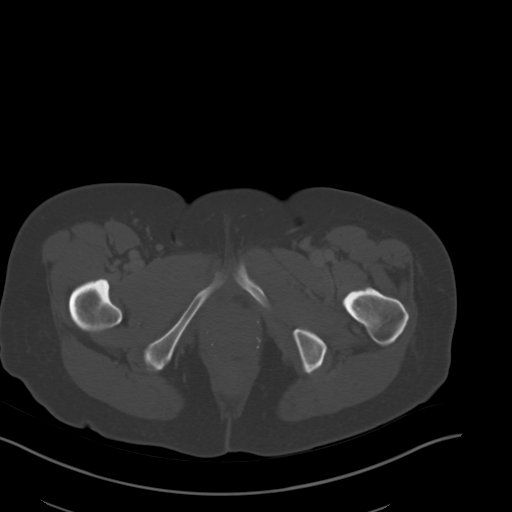
[im 12/87  soft-tissue]
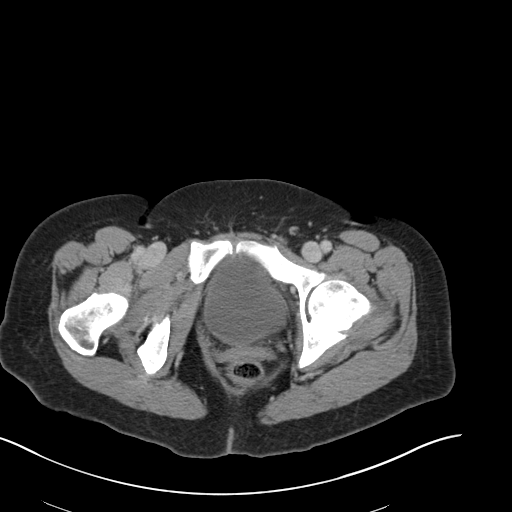
[im 18/87  soft-tissue]
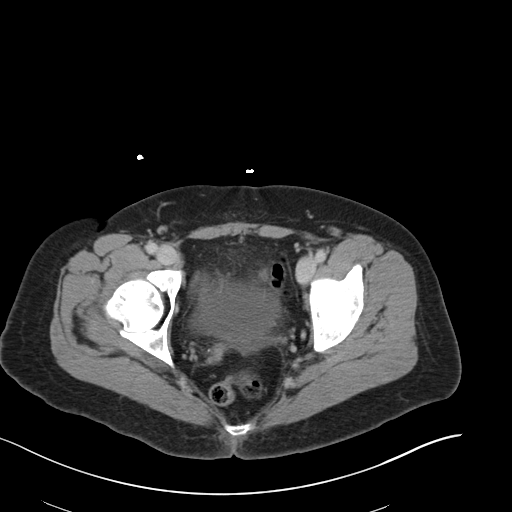
[im 29/87  soft-tissue]
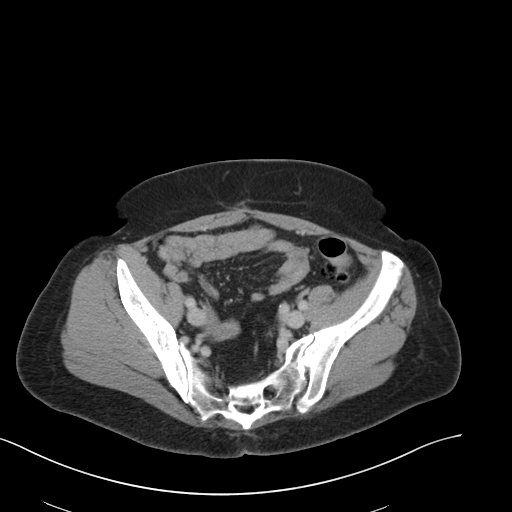
[im 35/87  soft-tissue]
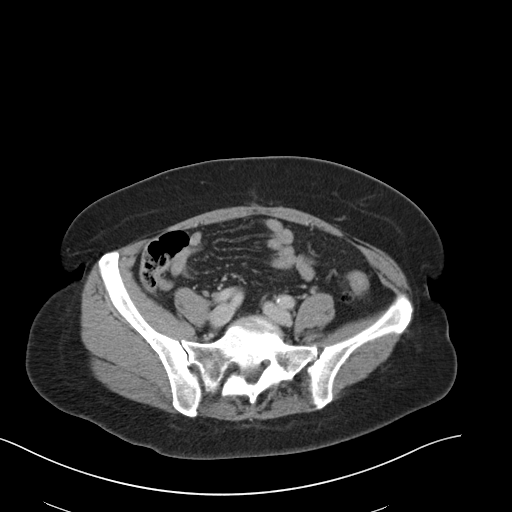
[im 41/87  soft-tissue]
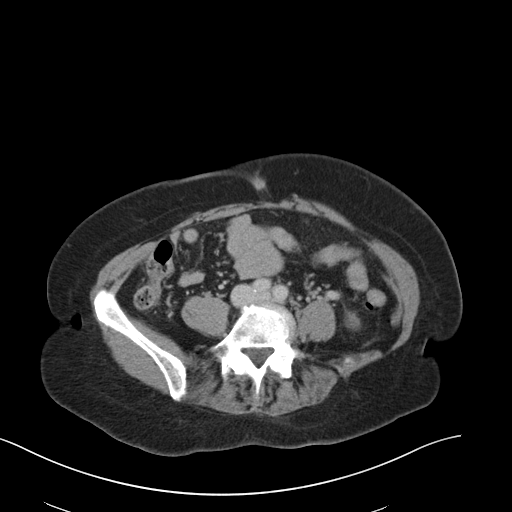
[im 46/87  soft-tissue]
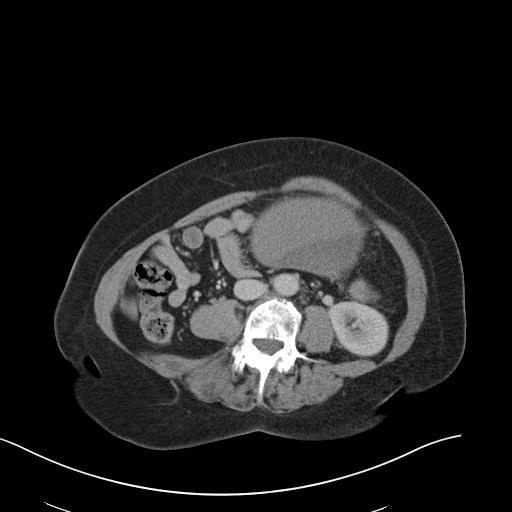
[im 52/87  soft-tissue]
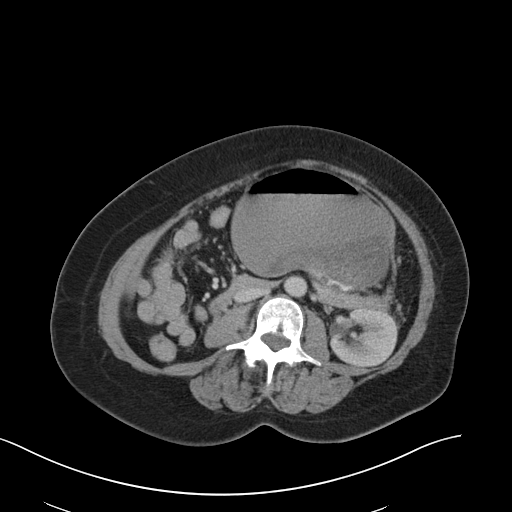
[im 58/87  soft-tissue]
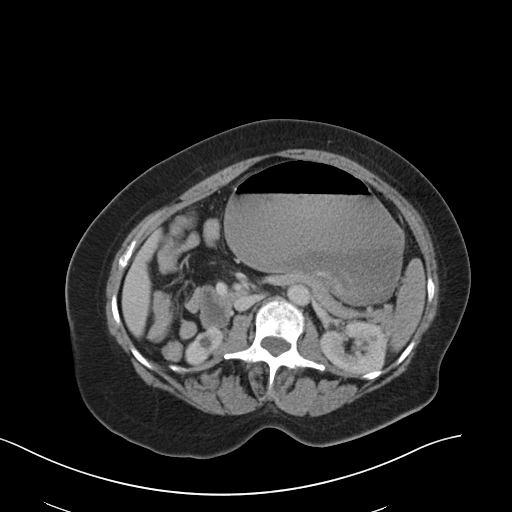
[im 58/87  bone]
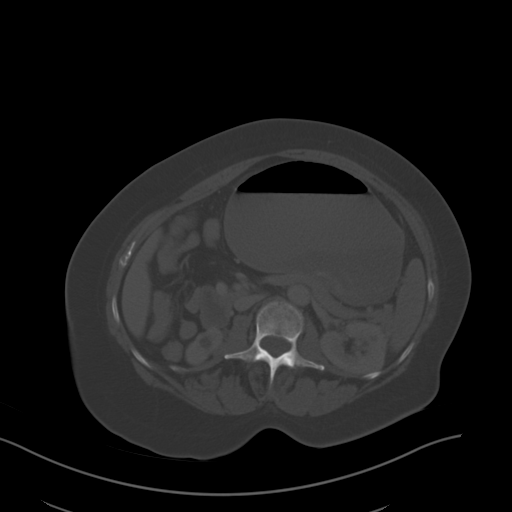
[im 64/87  lung]
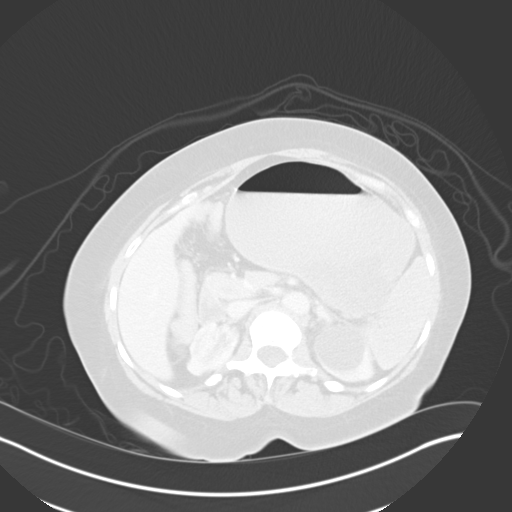
[im 69/87  soft-tissue]
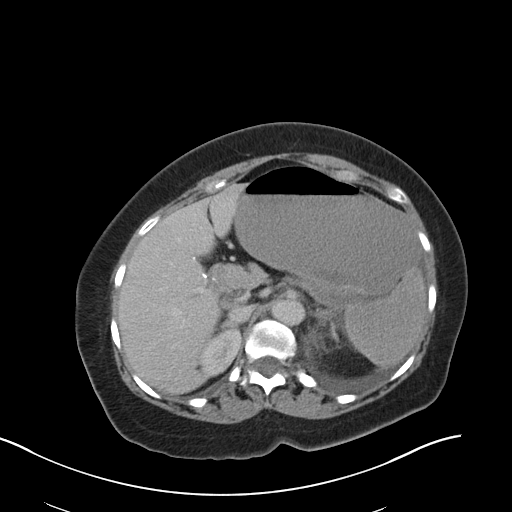
[im 69/87  lung]
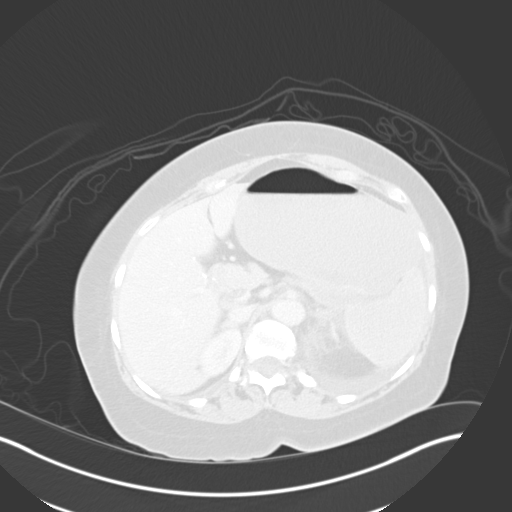
[im 75/87  soft-tissue]
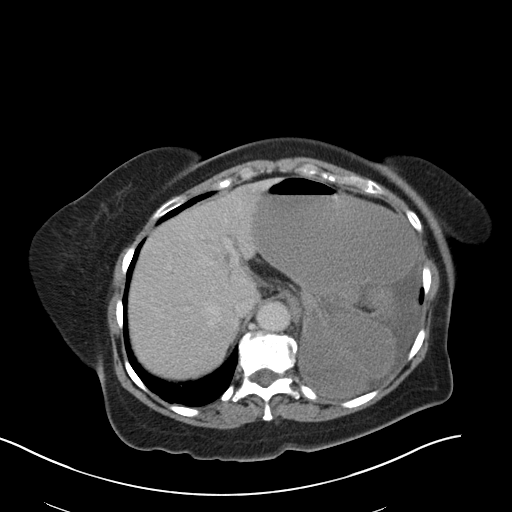
[im 75/87  lung]
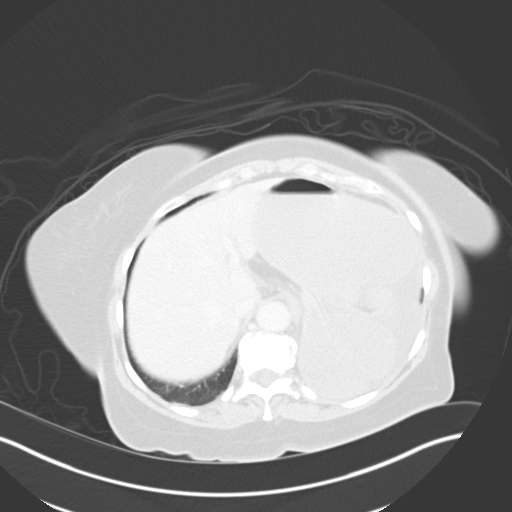
[im 81/87  soft-tissue]
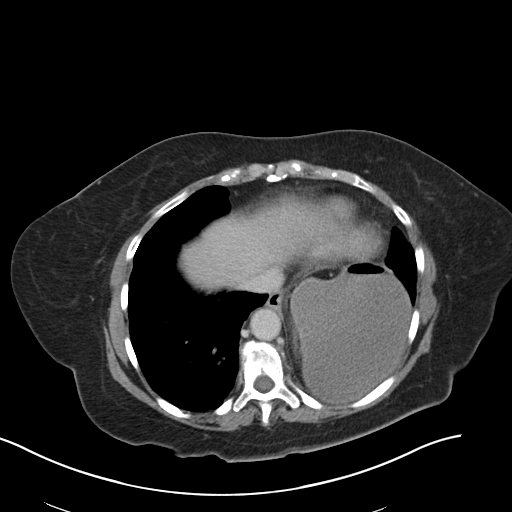
[im 81/87  lung]
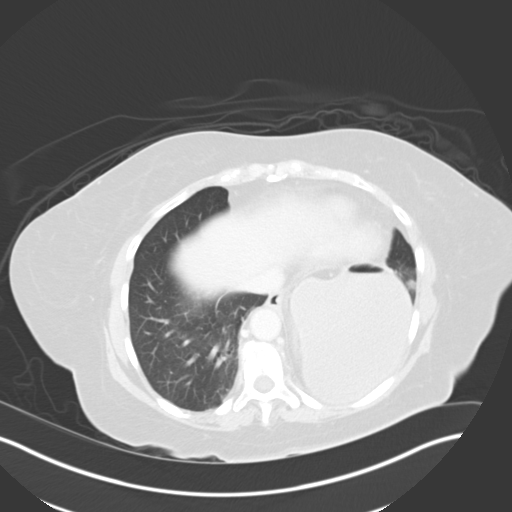

[13 of 32 positions shown; findings below may reference images not displayed]

FINDINGS: Lower chest: Several small nodules at the right lung base are
stable, measuring up to 4 mm on image 3. This is not grossly changed
6222, consistent with a benign finding. There is a large left-sided
Bochdalek's hernia containing approximately a third of the stomach,
similar to the most recent examination. There is small amount of
adjacent left pleural fluid which is unchanged. Associated left
lower lobe atelectasis is slightly improved from the most recent
study.

Hepatobiliary: The liver is normal in density without focal
abnormality. The biliary system appears unchanged status post
cholecystectomy. Next found

Pancreas: Unremarkable. No pancreatic ductal dilatation or
surrounding inflammatory changes.

Spleen: Normal in size without focal abnormality.

Adrenals/Urinary Tract: Both adrenal glands appear normal.There is
stable marked renal cortical thinning on the right. Bilateral renal
cysts are noted. There is no evidence of hydronephrosis or ureteral
calculus. The bladder appears unremarkable.

Stomach/Bowel: No enteric contrast was administered. Although
herniation of the distal gastric body into the left-sided Bochdalek
hernia is chronic, there is increased fluid distension of the
subdiaphragmatic portion of the proximal gastric body. The proximal
small bowel is decompressed. Appearance is concerning for gastric
outlet obstruction. There are diverticular changes throughout the
colon.

Vascular/Lymphatic: There are no enlarged abdominal or pelvic lymph
nodes. Stable mild aortoiliac atherosclerosis.

Reproductive: Status post hysterectomy. No evidence of adnexal mass.

Other: No evidence of abdominal wall mass or hernia.

Musculoskeletal: No acute or significant osseous findings. There is
a stable convex left thoracolumbar scoliosis with endplate sclerosis
in the lower thoracic spine.
IMPRESSION: 1. Increased fluid distension of the stomach concerning for
incarceration within the known Bochdalek's hernia and gastric outlet
obstruction. Surgical consultation recommended.
2. No other evidence of acute process within the abdomen or pelvis.
3. Chronic right renal cortical thinning and bilateral renal cysts.
4. Stable nodules at the right lung base consistent with a benign
etiology.
5. These results were called by telephone at the time of
interpretation on 10/02/2014 at [DATE] to Dr. Winara , who verbally
acknowledged these results.

## 2015-12-03 ENCOUNTER — Other Ambulatory Visit: Payer: Self-pay | Admitting: Family Medicine

## 2015-12-03 DIAGNOSIS — Z1231 Encounter for screening mammogram for malignant neoplasm of breast: Secondary | ICD-10-CM

## 2015-12-20 ENCOUNTER — Ambulatory Visit
Admission: RE | Admit: 2015-12-20 | Discharge: 2015-12-20 | Disposition: A | Discharge status: Home / Self Care | Payer: Medicare Other | Source: Ambulatory Visit | Attending: Family Medicine | Admitting: Family Medicine

## 2015-12-20 DIAGNOSIS — Z1231 Encounter for screening mammogram for malignant neoplasm of breast: Secondary | ICD-10-CM

## 2015-12-24 ENCOUNTER — Other Ambulatory Visit: Payer: Self-pay | Admitting: Family Medicine

## 2015-12-24 DIAGNOSIS — R928 Other abnormal and inconclusive findings on diagnostic imaging of breast: Secondary | ICD-10-CM

## 2016-01-01 ENCOUNTER — Ambulatory Visit
Admission: RE | Admit: 2016-01-01 | Discharge: 2016-01-01 | Disposition: A | Discharge status: Home / Self Care | Payer: Medicare Other | Source: Ambulatory Visit | Attending: Family Medicine | Admitting: Family Medicine

## 2016-01-01 ENCOUNTER — Other Ambulatory Visit: Payer: Self-pay | Admitting: Family Medicine

## 2016-01-01 DIAGNOSIS — R928 Other abnormal and inconclusive findings on diagnostic imaging of breast: Secondary | ICD-10-CM

## 2016-01-01 DIAGNOSIS — N6320 Unspecified lump in the left breast, unspecified quadrant: Secondary | ICD-10-CM

## 2016-01-02 ENCOUNTER — Ambulatory Visit
Admission: RE | Admit: 2016-01-02 | Discharge: 2016-01-02 | Disposition: A | Discharge status: Home / Self Care | Payer: Medicare Other | Source: Ambulatory Visit | Attending: Family Medicine | Admitting: Family Medicine

## 2016-01-02 ENCOUNTER — Other Ambulatory Visit: Payer: Self-pay | Admitting: Family Medicine

## 2016-01-02 DIAGNOSIS — N6320 Unspecified lump in the left breast, unspecified quadrant: Secondary | ICD-10-CM

## 2016-01-02 DIAGNOSIS — C50919 Malignant neoplasm of unspecified site of unspecified female breast: Secondary | ICD-10-CM

## 2016-01-02 HISTORY — DX: Malignant neoplasm of unspecified site of unspecified female breast: C50.919

## 2016-02-04 ENCOUNTER — Other Ambulatory Visit: Payer: Self-pay | Admitting: General Surgery

## 2016-02-04 DIAGNOSIS — D242 Benign neoplasm of left breast: Secondary | ICD-10-CM

## 2016-02-10 ENCOUNTER — Other Ambulatory Visit: Payer: Self-pay | Admitting: General Surgery

## 2016-02-10 DIAGNOSIS — D242 Benign neoplasm of left breast: Secondary | ICD-10-CM

## 2016-02-19 ENCOUNTER — Other Ambulatory Visit (HOSPITAL_COMMUNITY): Payer: Self-pay | Admitting: *Deleted

## 2016-02-19 NOTE — Pre-Procedure Instructions (Signed)
    [name]  02/19/2016     Your procedure is scheduled on Thursday, February 27, 2016 at 7:30 AM.   Report to [facility] Entrance "A" Admitting Office at 5:30 AM.   Call this number if you have problems the morning of surgery: [phone]   Questions prior to day of surgery, please call [phone] between 8 & 4 PM.   Remember:  Do not eat food or drink liquids after midnight Wednesday, 02/26/16.  Take these medicines the morning of surgery with A SIP OF WATER: Amlodipine (Norvasc), Tylenol - if needed  Stop Aspirin and Multivitamins as of today.    Do not wear jewelry, make-up or nail polish.  Do not wear lotions, powders, or perfumes, or deodorant.  Do not shave 48 hours prior to surgery.    Do not bring valuables to the hospital.  [facility] is not responsible for any belongings or valuables.  Contacts, dentures or bridgework may not be worn into surgery.  Leave your suitcase in the car.  After surgery it may be brought to your room.  For patients admitted to the hospital, discharge time will be determined by your treatment team.  Patients discharged the day of surgery will not be allowed to drive home.   Special instructions:  See "Preparing for Surgery" Instruction sheet.  Please read over the following fact sheets that you were given. Pain Booklet, Coughing and Deep Breathing and Surgical Site Infection Prevention

## 2016-02-20 ENCOUNTER — Encounter (HOSPITAL_COMMUNITY): Payer: Self-pay

## 2016-02-20 ENCOUNTER — Encounter (HOSPITAL_COMMUNITY)
Admission: RE | Admit: 2016-02-20 | Discharge: 2016-02-20 | Disposition: A | Discharge status: Home / Self Care | Payer: Medicare Other | Source: Ambulatory Visit | Attending: General Surgery | Admitting: General Surgery

## 2016-02-20 DIAGNOSIS — Z01818 Encounter for other preprocedural examination: Secondary | ICD-10-CM | POA: Diagnosis not present

## 2016-02-20 DIAGNOSIS — Z01812 Encounter for preprocedural laboratory examination: Secondary | ICD-10-CM | POA: Diagnosis not present

## 2016-02-20 DIAGNOSIS — I10 Essential (primary) hypertension: Secondary | ICD-10-CM | POA: Insufficient documentation

## 2016-02-20 DIAGNOSIS — C50912 Malignant neoplasm of unspecified site of left female breast: Secondary | ICD-10-CM | POA: Insufficient documentation

## 2016-02-20 HISTORY — DX: Presence of spectacles and contact lenses: Z97.3

## 2016-02-20 LAB — BASIC METABOLIC PANEL
Anion gap: 7 (ref 5–15)
BUN: 17 mg/dL (ref 6–20)
CALCIUM: 9.1 mg/dL (ref 8.9–10.3)
CO2: 27 mmol/L (ref 22–32)
CREATININE: 1.41 mg/dL — AB (ref 0.44–1.00)
Chloride: 105 mmol/L (ref 101–111)
GFR, EST AFRICAN AMERICAN: 41 mL/min — AB (ref >60)
GFR, EST NON AFRICAN AMERICAN: 36 mL/min — AB (ref >60)
Glucose, Bld: 98 mg/dL (ref 65–99)
Potassium: 3.8 mmol/L (ref 3.5–5.1)
Sodium: 139 mmol/L (ref 135–145)

## 2016-02-20 LAB — CBC
HCT: 41.1 % (ref 36.0–46.0)
Hemoglobin: 13.2 g/dL (ref 12.0–15.0)
MCH: 28.3 pg (ref 26.0–34.0)
MCHC: 32.1 g/dL (ref 30.0–36.0)
MCV: 88 fL (ref 78.0–100.0)
PLATELETS: 356 10*3/uL (ref 150–400)
RBC: 4.67 MIL/uL (ref 3.87–5.11)
RDW: 15.1 % (ref 11.5–15.5)
WBC: 6.4 10*3/uL (ref 4.0–10.5)

## 2016-02-20 NOTE — Progress Notes (Signed)
PCP - Dr. Leighton Ruff Cardiologist - denies  EKG - 02/20/16 CXR - denies  Echo/Stress test/Cardiac Cath - denies  Patient denies chest pain and shortness of breath at PAT appointment.

## 2016-02-25 ENCOUNTER — Ambulatory Visit
Admission: RE | Admit: 2016-02-25 | Discharge: 2016-02-25 | Disposition: A | Discharge status: Home / Self Care | Payer: Medicare Other | Source: Ambulatory Visit | Attending: General Surgery | Admitting: General Surgery

## 2016-02-25 DIAGNOSIS — D242 Benign neoplasm of left breast: Secondary | ICD-10-CM

## 2016-02-27 ENCOUNTER — Encounter (HOSPITAL_COMMUNITY)
Admission: RE | Disposition: A | Discharge status: Home / Self Care | Payer: Self-pay | Source: Ambulatory Visit | Attending: General Surgery

## 2016-02-27 ENCOUNTER — Ambulatory Visit (HOSPITAL_COMMUNITY): Payer: Medicare Other | Admitting: Anesthesiology

## 2016-02-27 ENCOUNTER — Encounter (HOSPITAL_COMMUNITY): Payer: Self-pay | Admitting: *Deleted

## 2016-02-27 ENCOUNTER — Ambulatory Visit (HOSPITAL_COMMUNITY)
Admission: RE | Admit: 2016-02-27 | Discharge: 2016-02-27 | Disposition: A | Discharge status: Home / Self Care | Payer: Medicare Other | Source: Ambulatory Visit | Attending: General Surgery | Admitting: General Surgery

## 2016-02-27 ENCOUNTER — Ambulatory Visit
Admission: RE | Admit: 2016-02-27 | Discharge: 2016-02-27 | Disposition: A | Discharge status: Home / Self Care | Payer: Medicare Other | Source: Ambulatory Visit | Attending: General Surgery | Admitting: General Surgery

## 2016-02-27 DIAGNOSIS — I10 Essential (primary) hypertension: Secondary | ICD-10-CM | POA: Insufficient documentation

## 2016-02-27 DIAGNOSIS — D242 Benign neoplasm of left breast: Secondary | ICD-10-CM | POA: Diagnosis present

## 2016-02-27 HISTORY — PX: BREAST LUMPECTOMY WITH RADIOACTIVE SEED LOCALIZATION: SHX6424

## 2016-02-27 SURGERY — BREAST LUMPECTOMY WITH RADIOACTIVE SEED LOCALIZATION
Anesthesia: General | Site: Breast | Laterality: Left

## 2016-02-27 MED ORDER — FENTANYL CITRATE (PF) 100 MCG/2ML IJ SOLN
INTRAMUSCULAR | Status: DC | PRN
  Administered 2016-02-27 (×2): 50 ug via INTRAVENOUS
  Administered 2016-02-27: 100 ug via INTRAVENOUS
  Administered 2016-02-27: 50 ug via INTRAVENOUS

## 2016-02-27 MED ORDER — CHLORHEXIDINE GLUCONATE CLOTH 2 % EX PADS: 6.0000 | MEDICATED_PAD | Freq: Once | CUTANEOUS | Status: DC

## 2016-02-27 MED ORDER — LIDOCAINE 2% (20 MG/ML) 5 ML SYRINGE
INTRAMUSCULAR | Status: AC
  Filled 2016-02-27: qty 5

## 2016-02-27 MED ORDER — HYDROMORPHONE HCL 1 MG/ML IJ SOLN
INTRAMUSCULAR | Status: AC
  Administered 2016-02-27: 0.5 mg via INTRAVENOUS
  Filled 2016-02-27: qty 1

## 2016-02-27 MED ORDER — BUPIVACAINE-EPINEPHRINE (PF) 0.25% -1:200000 IJ SOLN
INTRAMUSCULAR | Status: AC
  Filled 2016-02-27: qty 30

## 2016-02-27 MED ORDER — HYDROCODONE-ACETAMINOPHEN 5-325 MG PO TABS
1.0000 | ORAL_TABLET | Freq: Once | ORAL | Status: AC
  Administered 2016-02-27: 2 via ORAL

## 2016-02-27 MED ORDER — ONDANSETRON HCL 4 MG/2ML IJ SOLN
INTRAMUSCULAR | Status: AC
  Filled 2016-02-27: qty 2

## 2016-02-27 MED ORDER — MIDAZOLAM HCL 2 MG/2ML IJ SOLN
INTRAMUSCULAR | Status: AC
  Filled 2016-02-27: qty 2

## 2016-02-27 MED ORDER — BUPIVACAINE-EPINEPHRINE 0.25% -1:200000 IJ SOLN
INTRAMUSCULAR | Status: DC | PRN
  Administered 2016-02-27: 29 mL

## 2016-02-27 MED ORDER — PROMETHAZINE HCL 25 MG/ML IJ SOLN: 6.2500 mg | INTRAMUSCULAR | Status: DC | PRN

## 2016-02-27 MED ORDER — HYDROMORPHONE HCL 1 MG/ML IJ SOLN
0.5000 mg | INTRAMUSCULAR | Status: DC | PRN
  Administered 2016-02-27 (×2): 0.5 mg via INTRAVENOUS

## 2016-02-27 MED ORDER — PHENYLEPHRINE 40 MCG/ML (10ML) SYRINGE FOR IV PUSH (FOR BLOOD PRESSURE SUPPORT)
PREFILLED_SYRINGE | INTRAVENOUS | Status: AC
  Filled 2016-02-27: qty 10

## 2016-02-27 MED ORDER — PHENYLEPHRINE HCL 10 MG/ML IJ SOLN
INTRAMUSCULAR | Status: DC | PRN
  Administered 2016-02-27: 50 ug/min via INTRAVENOUS

## 2016-02-27 MED ORDER — TRAMADOL HCL 50 MG PO TABS
50.0000 mg | ORAL_TABLET | Freq: Four times a day (QID) | ORAL | 0 refills | Status: DC | PRN
Start: 2016-02-27 — End: 2016-04-22

## 2016-02-27 MED ORDER — MIDAZOLAM HCL 5 MG/5ML IJ SOLN
INTRAMUSCULAR | Status: DC | PRN
  Administered 2016-02-27 (×2): 1 mg via INTRAVENOUS

## 2016-02-27 MED ORDER — PHENYLEPHRINE HCL 10 MG/ML IJ SOLN
INTRAMUSCULAR | Status: DC | PRN
  Administered 2016-02-27 (×2): 80 ug via INTRAVENOUS

## 2016-02-27 MED ORDER — FENTANYL CITRATE (PF) 100 MCG/2ML IJ SOLN: 25.0000 ug | INTRAMUSCULAR | Status: DC | PRN

## 2016-02-27 MED ORDER — PROPOFOL 10 MG/ML IV BOLUS
INTRAVENOUS | Status: AC
  Filled 2016-02-27: qty 20

## 2016-02-27 MED ORDER — CEFAZOLIN SODIUM-DEXTROSE 2-4 GM/100ML-% IV SOLN
2.0000 g | INTRAVENOUS | Status: AC
  Administered 2016-02-27: 2 g via INTRAVENOUS
  Filled 2016-02-27: qty 100

## 2016-02-27 MED ORDER — HYDROCODONE-ACETAMINOPHEN 5-325 MG PO TABS: 1.0000 | ORAL_TABLET | ORAL | 0 refills | Status: DC | PRN

## 2016-02-27 MED ORDER — LIDOCAINE HCL (CARDIAC) 20 MG/ML IV SOLN
INTRAVENOUS | Status: DC | PRN
  Administered 2016-02-27: 100 mg via INTRAVENOUS

## 2016-02-27 MED ORDER — 0.9 % SODIUM CHLORIDE (POUR BTL) OPTIME
TOPICAL | Status: DC | PRN
  Administered 2016-02-27: 1000 mL

## 2016-02-27 MED ORDER — FENTANYL CITRATE (PF) 100 MCG/2ML IJ SOLN
INTRAMUSCULAR | Status: AC
  Filled 2016-02-27: qty 4

## 2016-02-27 MED ORDER — PROPOFOL 10 MG/ML IV BOLUS
INTRAVENOUS | Status: DC | PRN
  Administered 2016-02-27: 100 mg via INTRAVENOUS

## 2016-02-27 MED ORDER — LACTATED RINGERS IV SOLN
INTRAVENOUS | Status: DC | PRN
  Administered 2016-02-27: 07:00:00 via INTRAVENOUS

## 2016-02-27 MED ORDER — HYDROCODONE-ACETAMINOPHEN 5-325 MG PO TABS
ORAL_TABLET | ORAL | Status: AC
  Filled 2016-02-27: qty 2

## 2016-02-27 SURGICAL SUPPLY — 40 items
APPLIER CLIP 9.375 MED OPEN (MISCELLANEOUS)
APR CLP MED 9.3 20 MLT OPN (MISCELLANEOUS)
BINDER BREAST LRG (GAUZE/BANDAGES/DRESSINGS) IMPLANT
BINDER BREAST XLRG (GAUZE/BANDAGES/DRESSINGS) IMPLANT
BLADE SURG 15 STRL LF DISP TIS (BLADE) ×1 IMPLANT
BLADE SURG 15 STRL SS (BLADE) ×3
CANISTER SUCTION 2500CC (MISCELLANEOUS) ×3 IMPLANT
CHLORAPREP W/TINT 26ML (MISCELLANEOUS) ×3 IMPLANT
CLIP APPLIE 9.375 MED OPEN (MISCELLANEOUS) IMPLANT
COVER PROBE W GEL 5X96 (DRAPES) ×3 IMPLANT
COVER SURGICAL LIGHT HANDLE (MISCELLANEOUS) ×3 IMPLANT
DEVICE DUBIN SPECIMEN MAMMOGRA (MISCELLANEOUS) ×3 IMPLANT
DRAPE CHEST BREAST 15X10 FENES (DRAPES) ×3 IMPLANT
DRAPE UTILITY XL STRL (DRAPES) ×3 IMPLANT
ELECT COATED BLADE 2.86 ST (ELECTRODE) ×3 IMPLANT
ELECT REM PT RETURN 9FT ADLT (ELECTROSURGICAL) ×3
ELECTRODE REM PT RTRN 9FT ADLT (ELECTROSURGICAL) ×1 IMPLANT
GLOVE BIO SURGEON STRL SZ7.5 (GLOVE) ×6 IMPLANT
GOWN STRL REUS W/ TWL LRG LVL3 (GOWN DISPOSABLE) ×2 IMPLANT
GOWN STRL REUS W/TWL LRG LVL3 (GOWN DISPOSABLE) ×6
KIT BASIN OR (CUSTOM PROCEDURE TRAY) ×3 IMPLANT
KIT MARKER MARGIN INK (KITS) ×3 IMPLANT
LIGHT WAVEGUIDE WIDE FLAT (MISCELLANEOUS) ×2 IMPLANT
LIQUID BAND (GAUZE/BANDAGES/DRESSINGS) ×3 IMPLANT
NDL HYPO 25X1 1.5 SAFETY (NEEDLE) ×1 IMPLANT
NEEDLE HYPO 25X1 1.5 SAFETY (NEEDLE) ×3 IMPLANT
NS IRRIG 1000ML POUR BTL (IV SOLUTION) ×3 IMPLANT
PACK SURGICAL SETUP 50X90 (CUSTOM PROCEDURE TRAY) ×3 IMPLANT
PENCIL BUTTON HOLSTER BLD 10FT (ELECTRODE) ×3 IMPLANT
SPONGE LAP 18X18 X RAY DECT (DISPOSABLE) ×3 IMPLANT
SUT MNCRL AB 4-0 PS2 18 (SUTURE) ×3 IMPLANT
SUT SILK 2 0 SH (SUTURE) IMPLANT
SUT VIC AB 3-0 SH 18 (SUTURE) ×3 IMPLANT
SYR BULB 3OZ (MISCELLANEOUS) ×3 IMPLANT
SYR CONTROL 10ML LL (SYRINGE) ×3 IMPLANT
TOWEL OR 17X24 6PK STRL BLUE (TOWEL DISPOSABLE) ×3 IMPLANT
TOWEL OR 17X26 10 PK STRL BLUE (TOWEL DISPOSABLE) ×1 IMPLANT
TUBE CONNECTING 12'X1/4 (SUCTIONS) ×1
TUBE CONNECTING 12X1/4 (SUCTIONS) ×2 IMPLANT
YANKAUER SUCT BULB TIP NO VENT (SUCTIONS) ×3 IMPLANT

## 2016-02-27 NOTE — Anesthesia Procedure Notes (Signed)
Procedure Name: LMA Insertion Date/Time: 02/27/2016 7:47 AM Performed by: Greggory Stallion, Ramiel Forti L Pre-anesthesia Checklist: Patient identified, Emergency Drugs available, Suction available and Patient being monitored Patient Re-evaluated:Patient Re-evaluated prior to inductionOxygen Delivery Method: Circle System Utilized Preoxygenation: Pre-oxygenation with 100% oxygen Intubation Type: IV induction Ventilation: Mask ventilation without difficulty LMA: LMA inserted LMA Size: 5.0 Number of attempts: 1 Airway Equipment and Method: Bite block Placement Confirmation: positive ETCO2 Tube secured with: Tape Dental Injury: Teeth and Oropharynx as per pre-operative assessment

## 2016-02-27 NOTE — Anesthesia Preprocedure Evaluation (Signed)
Anesthesia Evaluation  Patient identified by MRN, date of birth, ID band Patient awake    Reviewed: Allergy & Precautions, NPO status , Patient's Chart, lab work & pertinent test results  Airway Mallampati: II       Dental   Pulmonary neg pulmonary ROS,    breath sounds clear to auscultation       Cardiovascular hypertension,  Rhythm:Regular Rate:Normal     Neuro/Psych    GI/Hepatic negative GI ROS, Neg liver ROS,   Endo/Other    Renal/GU Renal disease     Musculoskeletal   Abdominal   Peds  Hematology   Anesthesia Other Findings   Reproductive/Obstetrics                             Anesthesia Physical Anesthesia Plan  ASA: III  Anesthesia Plan: General   Post-op Pain Management:    Induction: Intravenous  Airway Management Planned: LMA  Additional Equipment:   Intra-op Plan:   Post-operative Plan: Extubation in OR  Informed Consent: I have reviewed the patients History and Physical, chart, labs and discussed the procedure including the risks, benefits and alternatives for the proposed anesthesia with the patient or authorized representative who has indicated his/her understanding and acceptance.     Plan Discussed with: CRNA and Anesthesiologist  Anesthesia Plan Comments:         Anesthesia Quick Evaluation

## 2016-02-27 NOTE — H&P (Signed)
Augustin Schooling  Location: [facility] Patient #: [id number] DOB: 1940-08-23 Married / Language: English / Race: Black or African American Female   History of Present Illness The patient is a 75 year old female who presents with a breast mass. The patient is a 75 year old black female who is an old patient of mine. She is about a year and a half out from a repair of a hiatal hernia. She recently went for a routine screening mammogram. At that time she was found to have a 1.5 cm abnormality in the upper outer left breast. This was biopsied and came back as an intraductal papilloma. She denies any breast pain or discharge from the nipple. She has no personal or family history of breast cancer.   Allergies No Known Drug Allergies  Medication History Tylenol (325MG  Tablet, Oral) Active. Vitamin D3 Active. Lasix (20MG  Tablet, Oral) Active. PriLOSEC (10MG  Capsule DR, Oral) Active. Benazepril HCl (10MG  Tablet, Oral) Active. Aspirin EC (81MG  Tablet DR, Oral) Active. Medications Reconciled    Review of Systems General Not Present- Appetite Loss, Chills, Fatigue, Fever, Night Sweats, Weight Gain and Weight Loss. Skin Not Present- Change in Wart/Mole, Dryness, Hives, Jaundice, New Lesions, Non-Healing Wounds, Rash and Ulcer. HEENT Not Present- Earache, Hearing Loss, Hoarseness, Nose Bleed, Oral Ulcers, Ringing in the Ears, Seasonal Allergies, Sinus Pain, Sore Throat, Visual Disturbances, Wears glasses/contact lenses and Yellow Eyes. Respiratory Not Present- Bloody sputum, Chronic Cough, Difficulty Breathing, Snoring and Wheezing. Breast Not Present- Breast Mass, Breast Pain, Nipple Discharge and Skin Changes. Cardiovascular Not Present- Chest Pain, Difficulty Breathing Lying Down, Leg Cramps, Palpitations, Rapid Heart Rate, Shortness of Breath and Swelling of Extremities. Gastrointestinal Not Present- Abdominal Pain, Bloating, Bloody Stool, Change in Bowel Habits, Chronic  diarrhea, Constipation, Difficulty Swallowing, Excessive gas, Gets full quickly at meals, Hemorrhoids, Indigestion, Nausea, Rectal Pain and Vomiting. Female Genitourinary Not Present- Frequency, Nocturia, Painful Urination, Pelvic Pain and Urgency. Musculoskeletal Not Present- Back Pain, Joint Pain, Joint Stiffness, Muscle Pain, Muscle Weakness and Swelling of Extremities. Neurological Not Present- Decreased Memory, Fainting, Headaches, Numbness, Seizures, Tingling, Tremor, Trouble walking and Weakness. Psychiatric Not Present- Anxiety, Bipolar, Change in Sleep Pattern, Depression, Fearful and Frequent crying. Endocrine Not Present- Cold Intolerance, Excessive Hunger, Hair Changes, Heat Intolerance, Hot flashes and New Diabetes. Hematology Not Present- Easy Bruising, Excessive bleeding, Gland problems, HIV and Persistent Infections.  Vitals Weight: 205 lb Height: 64in Body Surface Area: 1.98 m Body Mass Index: 35.19 kg/m  Temp.: 66F(Temporal)  Pulse: 76 (Regular)  BP: 130/80 (Sitting, Left Arm, Standard)       Physical Exam  General Mental Status-Alert. General Appearance-Consistent with stated age. Hydration-Well hydrated. Voice-Normal.  Head and Neck Head-normocephalic, atraumatic with no lesions or palpable masses. Trachea-midline. Thyroid Gland Characteristics - normal size and consistency.  Eye Eyeball - Bilateral-Extraocular movements intact. Sclera/Conjunctiva - Bilateral-No scleral icterus.  Chest and Lung Exam Chest and lung exam reveals -quiet, even and easy respiratory effort with no use of accessory muscles and on auscultation, normal breath sounds, no adventitious sounds and normal vocal resonance. Inspection Chest Wall - Normal. Back - normal.  Breast Note: There is no palpable mass in either breast. There is no palpable axillary, supraclavicular, or cervical lymphadenopathy.   Cardiovascular Cardiovascular examination  reveals -normal heart sounds, regular rate and rhythm with no murmurs and normal pedal pulses bilaterally.  Abdomen Inspection Inspection of the abdomen reveals - No Hernias. Skin - Scar - no surgical scars. Palpation/Percussion Palpation and Percussion of the abdomen reveal -  Soft, Non Tender, No Rebound tenderness, No Rigidity (guarding) and No hepatosplenomegaly. Auscultation Auscultation of the abdomen reveals - Bowel sounds normal.  Neurologic Neurologic evaluation reveals -alert and oriented x 3 with no impairment of recent or remote memory. Mental Status-Normal.  Musculoskeletal Normal Exam - Left-Upper Extremity Strength Normal and Lower Extremity Strength Normal. Normal Exam - Right-Upper Extremity Strength Normal and Lower Extremity Strength Normal.  Lymphatic Head & Neck  General Head & Neck Lymphatics: Bilateral - Description - Normal. Axillary  General Axillary Region: Bilateral - Description - Normal. Tenderness - Non Tender. Femoral & Inguinal  Generalized Femoral & Inguinal Lymphatics: Bilateral - Description - Normal. Tenderness - Non Tender.    Assessment & Plan INTRADUCTAL PAPILLOMA OF BREAST, LEFT (D24.2) Impression: The patient appears to have an intraductal papilloma in the 1 o'clock position of the left breast. Because of its abnormal appearance and because it can be considered a high risk lesion I would recommend having this area removed. She would also like to have this done. I have discussed with her in detail the risks and benefits of the operation to remove this area as well as some of the technical aspects and she understands and wishes to proceed. I will plan for a left breast radioactive seed localized lumpectomy Current Plans Pt Education - Breast Diseases: discussed with patient and provided information.

## 2016-02-27 NOTE — Op Note (Signed)
02/27/2016  8:39 AM  PATIENT:  [name]  75 y.o. female  PRE-OPERATIVE DIAGNOSIS:  LEFT BREAST PAPILLOMA  POST-OPERATIVE DIAGNOSIS:  LEFT BREAST PAPILLOMA  PROCEDURE:  Procedure(s): LEFT BREAST LUMPECTOMY WITH RADIOACTIVE SEED LOCALIZATION (Left)  SURGEON:  Surgeon(s) and Role:    * [name], MD - Primary  PHYSICIAN ASSISTANT:   ASSISTANTS: none   ANESTHESIA:   local and general  EBL:  Total I/O In: 900 [I.V.:900] Out: 30 [Blood:30]  BLOOD ADMINISTERED:none  DRAINS: none   LOCAL MEDICATIONS USED:  MARCAINE     SPECIMEN:  Source of Specimen:  left breast tissue  DISPOSITION OF SPECIMEN:  PATHOLOGY  COUNTS:  YES  TOURNIQUET:  * No tourniquets in log *  DICTATION: .Dragon Dictation   After informed consent was obtained the patient was brought to the operating room and placed in the supine position on the operating room table. After adequate induction of general anesthesia the patient's left breast was prepped with ChloraPrep, allowed to dry, and draped in usual sterile manner. An appropriate timeout was performed. Previously an I-125 seed was placed in the upper outer quadrant of the left breast to mark an area of an intraductal papilloma. The neoprobe was set to I-125 in the area of radioactivity was readily identified. Next the general area around the radioactive seed was infiltrated with quarter percent Marcaine with epinephrine. A curvilinear incision was made along the edge of the upper portion of the areola with a 15 blade knife. The incision was carried through the skin and subcutaneous tissue sharply with the electrocautery. Next the dissection was carried superiorly and laterally through the breast tissue directed by the neoprobe. Once we approached the area of the radioactive seed we then identified the area of the radioactive seed with the neoprobe. While checking the area of radioactivity frequently a circular portion of breast tissue was excised sharply  around the radioactive seed. Once the specimen was removed it was oriented with the appropriate paint colors. A specimen radiograph was obtained that showed the clip and seed to be within the specimen. The specimen was then sent to pathology for further evaluation. Hemostasis was achieved using the Bovie electrocautery. The lumpectomy cavity was marked with 2 clips. Next the cavity was infiltrated with quarter percent Marcaine and irrigated with copious amounts of saline. The deep layer of the incision was then closed with layers of interrupted 3-0 Vicryl stitches. The skin was then closed with interrupted 4-0 Monocryl subcuticular stitches. Dermabond dressings were applied. The patient tolerated the procedure well. At the end of the case all needle sponge and instrument counts were correct. The patient was then awakened and taken to recovery in stable condition.  PLAN OF CARE: Discharge to home after PACU  PATIENT DISPOSITION:  PACU - hemodynamically stable.   Delay start of Pharmacological VTE agent (>24hrs) due to surgical blood loss or risk of bleeding: not applicable

## 2016-02-27 NOTE — Interval H&P Note (Signed)
History and Physical Interval Note:  02/27/2016 7:17 AM  [name]  has presented today for surgery, with the diagnosis of LEFT BREAST PAPILLOMA  The various methods of treatment have been discussed with the patient and family. After consideration of risks, benefits and other options for treatment, the patient has consented to  Procedure(s): LEFT BREAST LUMPECTOMY WITH RADIOACTIVE SEED LOCALIZATION (Left) as a surgical intervention .  The patient's history has been reviewed, patient examined, no change in status, stable for surgery.  I have reviewed the patient's chart and labs.  Questions were answered to the patient's satisfaction.     TOTH III,PAUL S

## 2016-02-27 NOTE — Transfer of Care (Signed)
Immediate Anesthesia Transfer of Care Note  Patient: [name]  Procedure(s) Performed: Procedure(s): LEFT BREAST LUMPECTOMY WITH RADIOACTIVE SEED LOCALIZATION (Left)  Patient Location: PACU  Anesthesia Type:General  Level of Consciousness: awake, alert , oriented and patient cooperative  Airway & Oxygen Therapy: Patient Spontanous Breathing  Post-op Assessment: Report given to RN, Post -op Vital signs reviewed and stable and Patient moving all extremities  Post vital signs: Reviewed and stable  Last Vitals:  Vitals:   02/27/16 0554  BP: (!) 167/84  Pulse: 62  Resp: 20  Temp: 36.6 C    Last Pain:  Vitals:   02/27/16 0554  TempSrc: Oral         Complications: No apparent anesthesia complications

## 2016-02-27 NOTE — Anesthesia Postprocedure Evaluation (Signed)
Anesthesia Post Note  Patient: [name]  Procedure(s) Performed: Procedure(s) (LRB): LEFT BREAST LUMPECTOMY WITH RADIOACTIVE SEED LOCALIZATION (Left)  Patient location during evaluation: PACU Anesthesia Type: General Level of consciousness: awake Pain management: pain level controlled Vital Signs Assessment: post-procedure vital signs reviewed and stable Respiratory status: spontaneous breathing Cardiovascular status: stable Anesthetic complications: no    Last Vitals:  Vitals:   02/27/16 1000 02/27/16 1015  BP: 132/64   Pulse: 70   Resp: (!) 21 20  Temp: 36.7 C 36.7 C    Last Pain:  Vitals:   02/27/16 0930  TempSrc:   PainSc: 6                  EDWARDS,Kayton Dunaj

## 2016-02-28 ENCOUNTER — Encounter (HOSPITAL_COMMUNITY): Payer: Self-pay | Admitting: General Surgery

## 2016-03-04 ENCOUNTER — Other Ambulatory Visit: Payer: Self-pay | Admitting: General Surgery

## 2016-03-04 DIAGNOSIS — C50412 Malignant neoplasm of upper-outer quadrant of left female breast: Secondary | ICD-10-CM

## 2016-03-06 ENCOUNTER — Encounter (HOSPITAL_BASED_OUTPATIENT_CLINIC_OR_DEPARTMENT_OTHER): Payer: Self-pay | Admitting: *Deleted

## 2016-03-10 ENCOUNTER — Telehealth: Payer: Self-pay | Admitting: Hematology and Oncology

## 2016-03-10 ENCOUNTER — Encounter: Payer: Self-pay | Admitting: Hematology and Oncology

## 2016-03-10 NOTE — Telephone Encounter (Signed)
Appointment scheduled with Dr. [name] for 9/21 at 11:15am. Appt made with the patient's spouse. Agreed to appt. Letter mailed to the patient.

## 2016-03-12 ENCOUNTER — Ambulatory Visit (HOSPITAL_BASED_OUTPATIENT_CLINIC_OR_DEPARTMENT_OTHER): Payer: Medicare Other | Admitting: Anesthesiology

## 2016-03-12 ENCOUNTER — Ambulatory Visit (HOSPITAL_COMMUNITY)
Admission: RE | Admit: 2016-03-12 | Discharge: 2016-03-12 | Disposition: A | Discharge status: Home / Self Care | Payer: Medicare Other | Source: Ambulatory Visit | Attending: General Surgery | Admitting: General Surgery

## 2016-03-12 ENCOUNTER — Encounter (HOSPITAL_BASED_OUTPATIENT_CLINIC_OR_DEPARTMENT_OTHER): Payer: Self-pay | Admitting: *Deleted

## 2016-03-12 ENCOUNTER — Ambulatory Visit (HOSPITAL_BASED_OUTPATIENT_CLINIC_OR_DEPARTMENT_OTHER)
Admission: RE | Admit: 2016-03-12 | Discharge: 2016-03-12 | Disposition: A | Discharge status: Home / Self Care | Payer: Medicare Other | Source: Ambulatory Visit | Attending: General Surgery | Admitting: General Surgery

## 2016-03-12 ENCOUNTER — Encounter (HOSPITAL_BASED_OUTPATIENT_CLINIC_OR_DEPARTMENT_OTHER)
Admission: RE | Disposition: A | Discharge status: Home / Self Care | Payer: Self-pay | Source: Ambulatory Visit | Attending: General Surgery

## 2016-03-12 DIAGNOSIS — C50412 Malignant neoplasm of upper-outer quadrant of left female breast: Secondary | ICD-10-CM

## 2016-03-12 DIAGNOSIS — D242 Benign neoplasm of left breast: Secondary | ICD-10-CM | POA: Diagnosis not present

## 2016-03-12 DIAGNOSIS — I10 Essential (primary) hypertension: Secondary | ICD-10-CM | POA: Diagnosis not present

## 2016-03-12 DIAGNOSIS — Z7982 Long term (current) use of aspirin: Secondary | ICD-10-CM | POA: Diagnosis not present

## 2016-03-12 DIAGNOSIS — C50912 Malignant neoplasm of unspecified site of left female breast: Secondary | ICD-10-CM | POA: Diagnosis present

## 2016-03-12 DIAGNOSIS — Z79899 Other long term (current) drug therapy: Secondary | ICD-10-CM | POA: Diagnosis not present

## 2016-03-12 HISTORY — DX: Prediabetes: R73.03

## 2016-03-12 HISTORY — PX: BREAST LUMPECTOMY WITH SENTINEL LYMPH NODE BIOPSY: SHX5597

## 2016-03-12 SURGERY — BREAST LUMPECTOMY WITH SENTINEL LYMPH NODE BX
Anesthesia: General | Site: Breast | Laterality: Left

## 2016-03-12 MED ORDER — PROMETHAZINE HCL 25 MG/ML IJ SOLN: 6.2500 mg | INTRAMUSCULAR | Status: DC | PRN

## 2016-03-12 MED ORDER — PHENYLEPHRINE 40 MCG/ML (10ML) SYRINGE FOR IV PUSH (FOR BLOOD PRESSURE SUPPORT)
PREFILLED_SYRINGE | INTRAVENOUS | Status: AC
  Filled 2016-03-12: qty 10

## 2016-03-12 MED ORDER — HYDROMORPHONE HCL 1 MG/ML IJ SOLN
0.2500 mg | INTRAMUSCULAR | Status: DC | PRN
  Administered 2016-03-12 (×3): 0.5 mg via INTRAVENOUS

## 2016-03-12 MED ORDER — BUPIVACAINE-EPINEPHRINE (PF) 0.5% -1:200000 IJ SOLN
INTRAMUSCULAR | Status: DC | PRN
  Administered 2016-03-12: 30 mL

## 2016-03-12 MED ORDER — PHENYLEPHRINE HCL 10 MG/ML IJ SOLN
INTRAMUSCULAR | Status: DC | PRN
  Administered 2016-03-12: 40 ug via INTRAVENOUS
  Administered 2016-03-12: 80 ug via INTRAVENOUS

## 2016-03-12 MED ORDER — MIDAZOLAM HCL 2 MG/2ML IJ SOLN
1.0000 mg | INTRAMUSCULAR | Status: DC | PRN
  Administered 2016-03-12 (×2): 1 mg via INTRAVENOUS

## 2016-03-12 MED ORDER — CEFAZOLIN SODIUM-DEXTROSE 2-4 GM/100ML-% IV SOLN
2.0000 g | INTRAVENOUS | Status: AC
  Administered 2016-03-12: 2 g via INTRAVENOUS

## 2016-03-12 MED ORDER — BUPIVACAINE HCL (PF) 0.25 % IJ SOLN
INTRAMUSCULAR | Status: DC | PRN
  Administered 2016-03-12: 10 mL

## 2016-03-12 MED ORDER — CHLORHEXIDINE GLUCONATE CLOTH 2 % EX PADS: 6.0000 | MEDICATED_PAD | Freq: Once | CUTANEOUS | Status: DC

## 2016-03-12 MED ORDER — PROPOFOL 10 MG/ML IV BOLUS
INTRAVENOUS | Status: DC | PRN
  Administered 2016-03-12: 140 mg via INTRAVENOUS

## 2016-03-12 MED ORDER — PROPOFOL 10 MG/ML IV BOLUS
INTRAVENOUS | Status: AC
  Filled 2016-03-12: qty 20

## 2016-03-12 MED ORDER — HYDROMORPHONE HCL 1 MG/ML IJ SOLN
INTRAMUSCULAR | Status: AC
  Filled 2016-03-12: qty 1

## 2016-03-12 MED ORDER — LIDOCAINE 2% (20 MG/ML) 5 ML SYRINGE
INTRAMUSCULAR | Status: DC | PRN
  Administered 2016-03-12: 60 mg via INTRAVENOUS

## 2016-03-12 MED ORDER — METHYLENE BLUE 0.5 % INJ SOLN
INTRAVENOUS | Status: AC
  Filled 2016-03-12: qty 10

## 2016-03-12 MED ORDER — SODIUM CHLORIDE 0.9 % IJ SOLN
INTRAMUSCULAR | Status: AC
Start: 2016-03-12 — End: 2016-03-12
  Filled 2016-03-12: qty 10

## 2016-03-12 MED ORDER — FENTANYL CITRATE (PF) 100 MCG/2ML IJ SOLN
INTRAMUSCULAR | Status: AC
  Filled 2016-03-12: qty 2

## 2016-03-12 MED ORDER — FENTANYL CITRATE (PF) 100 MCG/2ML IJ SOLN
50.0000 ug | INTRAMUSCULAR | Status: AC | PRN
  Administered 2016-03-12: 50 ug via INTRAVENOUS
  Administered 2016-03-12: 25 ug via INTRAVENOUS
  Administered 2016-03-12: 50 ug via INTRAVENOUS

## 2016-03-12 MED ORDER — ONDANSETRON HCL 4 MG/2ML IJ SOLN
INTRAMUSCULAR | Status: DC | PRN
  Administered 2016-03-12: 4 mg via INTRAVENOUS

## 2016-03-12 MED ORDER — MIDAZOLAM HCL 2 MG/2ML IJ SOLN
INTRAMUSCULAR | Status: AC
  Filled 2016-03-12: qty 2

## 2016-03-12 MED ORDER — OXYCODONE HCL 5 MG PO TABS
5.0000 mg | ORAL_TABLET | Freq: Once | ORAL | Status: AC | PRN
  Administered 2016-03-12: 5 mg via ORAL

## 2016-03-12 MED ORDER — LACTATED RINGERS IV SOLN: INTRAVENOUS | Status: DC

## 2016-03-12 MED ORDER — CEFAZOLIN SODIUM-DEXTROSE 2-4 GM/100ML-% IV SOLN
INTRAVENOUS | Status: AC
  Filled 2016-03-12: qty 100

## 2016-03-12 MED ORDER — GLYCOPYRROLATE 0.2 MG/ML IJ SOLN: 0.2000 mg | Freq: Once | INTRAMUSCULAR | Status: DC | PRN

## 2016-03-12 MED ORDER — OXYCODONE HCL 5 MG/5ML PO SOLN: 5.0000 mg | Freq: Once | ORAL | Status: AC | PRN

## 2016-03-12 MED ORDER — FENTANYL CITRATE (PF) 100 MCG/2ML IJ SOLN
INTRAMUSCULAR | Status: AC
Start: 2016-03-12 — End: 2016-03-12
  Filled 2016-03-12: qty 2

## 2016-03-12 MED ORDER — ONDANSETRON HCL 4 MG/2ML IJ SOLN
INTRAMUSCULAR | Status: AC
  Filled 2016-03-12: qty 2

## 2016-03-12 MED ORDER — DEXAMETHASONE SODIUM PHOSPHATE 4 MG/ML IJ SOLN
INTRAMUSCULAR | Status: DC | PRN
  Administered 2016-03-12: 4 mg via INTRAVENOUS

## 2016-03-12 MED ORDER — DEXAMETHASONE SODIUM PHOSPHATE 10 MG/ML IJ SOLN
INTRAMUSCULAR | Status: AC
  Filled 2016-03-12: qty 1

## 2016-03-12 MED ORDER — LACTATED RINGERS IV SOLN
INTRAVENOUS | Status: DC
  Administered 2016-03-12 (×2): via INTRAVENOUS

## 2016-03-12 MED ORDER — TECHNETIUM TC 99M SULFUR COLLOID FILTERED
1.0000 | Freq: Once | INTRAVENOUS | Status: AC | PRN
  Administered 2016-03-12: 1 via INTRADERMAL

## 2016-03-12 MED ORDER — HYDROCODONE-ACETAMINOPHEN 5-325 MG PO TABS: 1.0000 | ORAL_TABLET | ORAL | 0 refills | Status: DC | PRN

## 2016-03-12 MED ORDER — LIDOCAINE 2% (20 MG/ML) 5 ML SYRINGE
INTRAMUSCULAR | Status: AC
  Filled 2016-03-12: qty 5

## 2016-03-12 MED ORDER — PHENYLEPHRINE HCL 10 MG/ML IJ SOLN
INTRAVENOUS | Status: DC | PRN
  Administered 2016-03-12: 50 ug/min via INTRAVENOUS

## 2016-03-12 MED ORDER — SODIUM CHLORIDE 0.9 % IJ SOLN
INTRAVENOUS | Status: DC | PRN
  Administered 2016-03-12: 16:00:00

## 2016-03-12 MED ORDER — MEPERIDINE HCL 25 MG/ML IJ SOLN: 6.2500 mg | INTRAMUSCULAR | Status: DC | PRN

## 2016-03-12 MED ORDER — SODIUM CHLORIDE 0.9 % IJ SOLN
INTRAMUSCULAR | Status: AC
  Filled 2016-03-12: qty 10

## 2016-03-12 MED ORDER — OXYCODONE HCL 5 MG PO TABS
ORAL_TABLET | ORAL | Status: AC
  Filled 2016-03-12: qty 1

## 2016-03-12 MED ORDER — SCOPOLAMINE 1 MG/3DAYS TD PT72: 1.0000 | MEDICATED_PATCH | Freq: Once | TRANSDERMAL | Status: DC | PRN

## 2016-03-12 SURGICAL SUPPLY — 44 items
APPLIER CLIP 11 MED OPEN (CLIP) ×3
APR CLP MED 11 20 MLT OPN (CLIP) ×1
BLADE SURG 15 STRL LF DISP TIS (BLADE) ×1 IMPLANT
BLADE SURG 15 STRL SS (BLADE) ×3
CANISTER SUCT 1200ML W/VALVE (MISCELLANEOUS) ×3 IMPLANT
CHLORAPREP W/TINT 26ML (MISCELLANEOUS) ×3 IMPLANT
CLIP APPLIE 11 MED OPEN (CLIP) ×1 IMPLANT
COVER BACK TABLE 60X90IN (DRAPES) ×3 IMPLANT
COVER MAYO STAND STRL (DRAPES) ×3 IMPLANT
COVER PROBE W GEL 5X96 (DRAPES) ×3 IMPLANT
DECANTER SPIKE VIAL GLASS SM (MISCELLANEOUS) IMPLANT
DEVICE DUBIN W/COMP PLATE 8390 (MISCELLANEOUS) IMPLANT
DRAPE LAPAROSCOPIC ABDOMINAL (DRAPES) ×3 IMPLANT
DRAPE UTILITY XL STRL (DRAPES) ×3 IMPLANT
ELECT COATED BLADE 2.86 ST (ELECTRODE) ×3 IMPLANT
ELECT REM PT RETURN 9FT ADLT (ELECTROSURGICAL) ×3
ELECTRODE REM PT RTRN 9FT ADLT (ELECTROSURGICAL) ×1 IMPLANT
GLOVE BIO SURGEON STRL SZ7.5 (GLOVE) ×3 IMPLANT
GLOVE BIOGEL PI IND STRL 7.0 (GLOVE) IMPLANT
GLOVE BIOGEL PI INDICATOR 7.0 (GLOVE) ×4
GOWN STRL REUS W/ TWL LRG LVL3 (GOWN DISPOSABLE) ×2 IMPLANT
GOWN STRL REUS W/TWL LRG LVL3 (GOWN DISPOSABLE) ×6
ILLUMINATOR WAVEGUIDE N/F (MISCELLANEOUS) IMPLANT
KIT MARKER MARGIN INK (KITS) IMPLANT
LIGHT WAVEGUIDE WIDE FLAT (MISCELLANEOUS) ×2 IMPLANT
LIQUID BAND (GAUZE/BANDAGES/DRESSINGS) ×3 IMPLANT
NDL HYPO 25X1 1.5 SAFETY (NEEDLE) ×1 IMPLANT
NDL SAFETY ECLIPSE 18X1.5 (NEEDLE) IMPLANT
NEEDLE HYPO 18GX1.5 SHARP (NEEDLE) ×3
NEEDLE HYPO 25X1 1.5 SAFETY (NEEDLE) ×6 IMPLANT
NS IRRIG 1000ML POUR BTL (IV SOLUTION) ×3 IMPLANT
PACK BASIN DAY SURGERY FS (CUSTOM PROCEDURE TRAY) ×3 IMPLANT
PENCIL BUTTON HOLSTER BLD 10FT (ELECTRODE) ×3 IMPLANT
SLEEVE SCD COMPRESS KNEE MED (MISCELLANEOUS) ×3 IMPLANT
SPONGE LAP 18X18 X RAY DECT (DISPOSABLE) ×3 IMPLANT
SUT MON AB 4-0 PC3 18 (SUTURE) ×3 IMPLANT
SUT SILK 3 0 PS 1 (SUTURE) IMPLANT
SUT VICRYL 3-0 CR8 SH (SUTURE) ×3 IMPLANT
SYR CONTROL 10ML LL (SYRINGE) ×5 IMPLANT
TOWEL OR 17X24 6PK STRL BLUE (TOWEL DISPOSABLE) ×3 IMPLANT
TOWEL OR NON WOVEN STRL DISP B (DISPOSABLE) ×1 IMPLANT
TUBE CONNECTING 20'X1/4 (TUBING) ×1
TUBE CONNECTING 20X1/4 (TUBING) ×2 IMPLANT
YANKAUER SUCT BULB TIP NO VENT (SUCTIONS) ×3 IMPLANT

## 2016-03-12 NOTE — Discharge Instructions (Signed)

## 2016-03-12 NOTE — Anesthesia Procedure Notes (Signed)
Anesthesia Regional Block:  Pectoralis block  Pre-Anesthetic Checklist: ,, timeout performed, Correct Patient, Correct Site, Correct Laterality, Correct Procedure, Correct Position, site marked, Risks and benefits discussed,  Surgical consent,  Pre-op evaluation,  At surgeon's request and post-op pain management  Laterality: Left  Prep: chloraprep       Needles:  Injection technique: Single-shot  Needle Type: Echogenic Needle     Needle Length: 9cm 9 cm Needle Gauge: 21 and 21 G    Additional Needles:  Procedures: ultrasound guided (picture in chart) Pectoralis block Narrative:  Start time: 03/12/2016 12:55 PM End time: 03/12/2016 12:59 PM Injection made incrementally with aspirations every 5 mL.  Performed by: Personally  Anesthesiologist: Suella Broad D  Additional Notes: Tolerated well. No complications noted.

## 2016-03-12 NOTE — Op Note (Signed)
03/12/2016  4:45 PM  PATIENT:  [name]  75 y.o. female  PRE-OPERATIVE DIAGNOSIS:  LEFT BREAST CANCER  POST-OPERATIVE DIAGNOSIS:  LEFT BREAST CANCER  PROCEDURE:  Procedure(s): RE-EXCISION OF LEFT BREAST SUPERIOR  AND MEDIAL MARGIN  WITH SENTINEL LYMPH NODE BX (Left) and injection blue dye  SURGEON:  Surgeon(s) and Role:    * [name], MD - Primary  PHYSICIAN ASSISTANT:   ASSISTANTS: none   ANESTHESIA:   local and general  EBL:  Total I/O In: 1000 [I.V.:1000] Out: 25 [Blood:25]  BLOOD ADMINISTERED:none  DRAINS: none   LOCAL MEDICATIONS USED:  MARCAINE     SPECIMEN:  Source of Specimen:  left breast superior and inferior margins and sentinel nodes X 2  DISPOSITION OF SPECIMEN:  PATHOLOGY  COUNTS:  YES  TOURNIQUET:  * No tourniquets in log *  DICTATION: .Dragon Dictation   After informed consent was obtained the patient was brought to the operating room and placed in the supine position on the operating room table. After adequate induction of general anesthesia the patient's left chest, breast, and axillary area were prepped with ChloraPrep, allowed to dry, and draped in the usual sterile manner. An appropriate timeout was performed.  The patient had previously had a lumpectomy for what was felt to be a papilloma. Her final pathology revealed a papillary breast cancer. She had a positive superior and medial margin. She returns today for reexcision of the superior and medial margin and sentinel mode mapping. At this point the axilla was examined with the neoprobe. Earlier in the day she underwent injection of 1 mCi of technetium sulfur colloid in the subareolar position on the left. There was a very faint signal in the left axilla. At this point, 4 mL of methylene blue and 1 mL of injectable saline were injected in the lateral subareolar area of the left breast outside of her previous incision.  Attention was then turned to the breast. The previous periareolar incision  along the upper portion of the areola was reopened sharply with a 15 blade knife.The previous tract was probed bluntly with a finger until the lumpectomy cavity was identified.The superior and medial margins were reexcised sharply with the electrocautery.  Each margin was marked with the appropriate paint color. Hemostasis was achieved using the Bovie electrocautery. The wound was irrigated with saline and infiltrated with quarter percent Marcaine. The cavity was marked with clips.The deep layer of the wound was then closed with layers of interrupted 3-0 Vicryl stitches. The skin was then closed with interrupted 4-0 Monocryl subcuticular stitches. The specimens were sent to pathology for further evaluation. Attention was then turned to the left axilla. At this point the  Signal from the neoprobe was a little bit stronger. A transversely oriented incision was made with a 15 blade knife overlying the area of radioactivity.  The incision was carried through the skin and subcutaneous tissue sharply with electrocautery until the axilla was entered. A Weidman retractor was deployed. The neoprobe was used to direct blunt dissection until a hot and blue lymph node was identified. The lymph node was excised sharply with the electrocautery and the lymphatics were controlled with clips.  Ex vivo counts on this node were approximately 100. There was a second palpable lymph node identified that was also excised sharply with the electrocautery and the lymphatics were controlled with clips. This was sent as sentinel nodes numbers 1 and 2. There were no other hot, blue, or palpable lymph nodes identified in the  left axilla.  The wound was infiltrated with quarter percent Marcaine. The deep layer of the wound was closed with interrupted 3-0 Vicryl stitches.  The skin was then closed with a running 4-0 Monocryl subcuticular stitch. Dermabond dressings were then applied. The patient tolerated the procedure well. At the end of the case  all needle sponge and instrument counts were correct. The patient was then awakened and taken to recovery in stable condition.  PLAN OF CARE: Discharge to home after PACU  PATIENT DISPOSITION:  PACU - hemodynamically stable.   Delay start of Pharmacological VTE agent (>24hrs) due to surgical blood loss or risk of bleeding: not applicable

## 2016-03-12 NOTE — Transfer of Care (Signed)
Immediate Anesthesia Transfer of Care Note  Patient: [name]  Procedure(s) Performed: Procedure(s): RE-EXCISION OF LEFT BREAST SUPERIOR  AND MEDIAL MARGIN  WITH SENTINEL LYMPH NODE BX (Left)  Patient Location: PACU  Anesthesia Type:General, with block for post op pain  Level of Consciousness: awake, alert  and patient cooperative  Airway & Oxygen Therapy: Patient Spontanous Breathing and Patient connected to face mask oxygen  Post-op Assessment: Report given to RN and Post -op Vital signs reviewed and stable  Post vital signs: Reviewed and stable  Last Vitals:  Vitals:   03/12/16 1325 03/12/16 1330  BP:  (!) 144/74  Pulse: 88 90  Resp: 14 18  Temp:      Last Pain:  Vitals:   03/12/16 1144  TempSrc: Oral         Complications: No apparent anesthesia complications

## 2016-03-12 NOTE — Anesthesia Procedure Notes (Signed)
Procedure Name: LMA Insertion Date/Time: 03/12/2016 3:37 PM Performed by: Baxter Flattery Pre-anesthesia Checklist: Patient identified, Emergency Drugs available, Suction available and Patient being monitored Patient Re-evaluated:Patient Re-evaluated prior to inductionOxygen Delivery Method: Circle system utilized Preoxygenation: Pre-oxygenation with 100% oxygen Intubation Type: IV induction Ventilation: Mask ventilation without difficulty LMA: LMA inserted LMA Size: 4.0 Number of attempts: 1 Airway Equipment and Method: Bite block Placement Confirmation: positive ETCO2 and breath sounds checked- equal and bilateral Tube secured with: Tape Dental Injury: Teeth and Oropharynx as per pre-operative assessment

## 2016-03-12 NOTE — H&P (Addendum)
[name]  Location: [facility] Patient #: [id number] DOB: Dec 29, 1940 Married / Language: English / Race: Black or African American Female   History of Present Illness  The patient is a 75 year old female who presents with a breast mass. The patient is a 75 year old black female who is an old patient of mine. She is about a year and a half out from a repair of a hiatal hernia. She recently went for a routine screening mammogram. At that time she was found to have a 1.5 cm abnormality in the upper outer left breast. This was biopsied and came back as an intraductal papilloma. She denies any breast pain or discharge from the nipple. She has no personal or family history of breast cancer.   Allergies  No Known Drug Allergies  Medication History  Tylenol (325MG  Tablet, Oral) Active. Vitamin D3 Active. Lasix (20MG  Tablet, Oral) Active. PriLOSEC (10MG  Capsule DR, Oral) Active. Benazepril HCl (10MG  Tablet, Oral) Active. Aspirin EC (81MG  Tablet DR, Oral) Active. Medications Reconciled    Review of Systems  General Not Present- Appetite Loss, Chills, Fatigue, Fever, Night Sweats, Weight Gain and Weight Loss. Skin Not Present- Change in Wart/Mole, Dryness, Hives, Jaundice, New Lesions, Non-Healing Wounds, Rash and Ulcer. HEENT Not Present- Earache, Hearing Loss, Hoarseness, Nose Bleed, Oral Ulcers, Ringing in the Ears, Seasonal Allergies, Sinus Pain, Sore Throat, Visual Disturbances, Wears glasses/contact lenses and Yellow Eyes. Respiratory Not Present- Bloody sputum, Chronic Cough, Difficulty Breathing, Snoring and Wheezing. Breast Not Present- Breast Mass, Breast Pain, Nipple Discharge and Skin Changes. Cardiovascular Not Present- Chest Pain, Difficulty Breathing Lying Down, Leg Cramps, Palpitations, Rapid Heart Rate, Shortness of Breath and Swelling of Extremities. Gastrointestinal Not Present- Abdominal Pain, Bloating, Bloody Stool, Change in Bowel Habits, Chronic  diarrhea, Constipation, Difficulty Swallowing, Excessive gas, Gets full quickly at meals, Hemorrhoids, Indigestion, Nausea, Rectal Pain and Vomiting. Female Genitourinary Not Present- Frequency, Nocturia, Painful Urination, Pelvic Pain and Urgency. Musculoskeletal Not Present- Back Pain, Joint Pain, Joint Stiffness, Muscle Pain, Muscle Weakness and Swelling of Extremities. Neurological Not Present- Decreased Memory, Fainting, Headaches, Numbness, Seizures, Tingling, Tremor, Trouble walking and Weakness. Psychiatric Not Present- Anxiety, Bipolar, Change in Sleep Pattern, Depression, Fearful and Frequent crying. Endocrine Not Present- Cold Intolerance, Excessive Hunger, Hair Changes, Heat Intolerance, Hot flashes and New Diabetes. Hematology Not Present- Easy Bruising, Excessive bleeding, Gland problems, HIV and Persistent Infections.  Vitals (Sonya Bynum CMA; 02/04/2016 9:42 AM) 02/04/2016 9:41 AM Weight: 205 lb Height: 64in Body Surface Area: 1.98 m Body Mass Index: 35.19 kg/m  Temp.: 22F(Temporal)  Pulse: 76 (Regular)  BP: 130/80 (Sitting, Left Arm, Standard)       Physical Exam  General Mental Status-Alert. General Appearance-Consistent with stated age. Hydration-Well hydrated. Voice-Normal.  Head and Neck Head-normocephalic, atraumatic with no lesions or palpable masses. Trachea-midline. Thyroid Gland Characteristics - normal size and consistency.  Eye Eyeball - Bilateral-Extraocular movements intact. Sclera/Conjunctiva - Bilateral-No scleral icterus.  Chest and Lung Exam Chest and lung exam reveals -quiet, even and easy respiratory effort with no use of accessory muscles and on auscultation, normal breath sounds, no adventitious sounds and normal vocal resonance. Inspection Chest Wall - Normal. Back - normal.  Breast Note: There is no palpable mass in either breast. There is no palpable axillary, supraclavicular, or cervical  lymphadenopathy.   Cardiovascular Cardiovascular examination reveals -normal heart sounds, regular rate and rhythm with no murmurs and normal pedal pulses bilaterally.  Abdomen Inspection Inspection of the abdomen reveals - No Hernias. Skin - Scar -  no surgical scars. Palpation/Percussion Palpation and Percussion of the abdomen reveal - Soft, Non Tender, No Rebound tenderness, No Rigidity (guarding) and No hepatosplenomegaly. Auscultation Auscultation of the abdomen reveals - Bowel sounds normal.  Neurologic Neurologic evaluation reveals -alert and oriented x 3 with no impairment of recent or remote memory. Mental Status-Normal.  Musculoskeletal Normal Exam - Left-Upper Extremity Strength Normal and Lower Extremity Strength Normal. Normal Exam - Right-Upper Extremity Strength Normal and Lower Extremity Strength Normal.  Lymphatic Head & Neck  General Head & Neck Lymphatics: Bilateral - Description - Normal. Axillary  General Axillary Region: Bilateral - Description - Normal. Tenderness - Non Tender. Femoral & Inguinal  Generalized Femoral & Inguinal Lymphatics: Bilateral - Description - Normal. Tenderness - Non Tender.    Assessment & Plan  INTRADUCTAL PAPILLOMA OF BREAST, LEFT (D24.2) Impression: The patient appears to have an intraductal papilloma in the 1 o'clock position of the left breast. Because of its abnormal appearance and because it can be considered a high risk lesion I would recommend having this area removed. She would also like to have this done. I have discussed with her in detail the risks and benefits of the operation to remove this area as well as some of the technical aspects and she understands and wishes to proceed. I will plan for a left breast radioactive seed localized lumpectomy Current Plans Pt Education - Breast Diseases: discussed with patient and provided information.   At lumpectomy the area was found to be a papillary cancer. The  superior margin was positive. She returns to day for re excision of the margin and sentinel node mapping. The risks and benefits of the surgery were discussed with the patient in detail and she understands and wishes to proceed.

## 2016-03-12 NOTE — Anesthesia Postprocedure Evaluation (Signed)
Anesthesia Post Note  Patient: [name]  Procedure(s) Performed: Procedure(s) (LRB): RE-EXCISION OF LEFT BREAST SUPERIOR  AND MEDIAL MARGIN  WITH SENTINEL LYMPH NODE BX (Left)  Anesthesia Type: General Level of consciousness: sedated, awake, patient cooperative and oriented Pain management: pain level controlled Vital Signs Assessment: post-procedure vital signs reviewed and stable Respiratory status: spontaneous breathing and respiratory function stable Cardiovascular status: stable Anesthetic complications: no    Last Vitals:  Vitals:   03/12/16 1330 03/12/16 1651  BP: (!) 144/74 (!) 142/81  Pulse: 90 96  Resp: 18 13  Temp:  36.4 C    Last Pain:  Vitals:   03/12/16 1651  TempSrc:   PainSc: 0-No pain                 Aleta Manternach EDWARD

## 2016-03-12 NOTE — Interval H&P Note (Signed)
History and Physical Interval Note:  03/12/2016 3:13 PM  [name]  has presented today for surgery, with the diagnosis of LEFT BREAST CANCER  The various methods of treatment have been discussed with the patient and family. After consideration of risks, benefits and other options for treatment, the patient has consented to  Procedure(s): RE-EXCISION OF LEFT BREAST SUPERIOR MARGIN  WITH SENTINEL LYMPH NODE BX (Left) as a surgical intervention .  The patient's history has been reviewed, patient examined, no change in status, stable for surgery.  I have reviewed the patient's chart and labs.  Questions were answered to the patient's satisfaction.     TOTH III,PAUL S

## 2016-03-12 NOTE — Anesthesia Preprocedure Evaluation (Addendum)
Anesthesia Evaluation  Patient identified by MRN, date of birth, ID band Patient awake    Reviewed: Allergy & Precautions, NPO status , Patient's Chart, lab work & pertinent test results  Airway Mallampati: II  TM Distance: >3 FB Neck ROM: Full    Dental  (+) Teeth Intact, Dental Advisory Given   Pulmonary neg pulmonary ROS,    breath sounds clear to auscultation       Cardiovascular hypertension, Pt. on medications  Rhythm:Regular Rate:Normal     Neuro/Psych  Neuromuscular disease negative psych ROS   GI/Hepatic negative GI ROS, Neg liver ROS,   Endo/Other  negative endocrine ROS  Renal/GU Renal InsufficiencyRenal disease  negative genitourinary   Musculoskeletal negative musculoskeletal ROS (+)   Abdominal   Peds negative pediatric ROS (+)  Hematology negative hematology ROS (+)   Anesthesia Other Findings   Reproductive/Obstetrics negative OB ROS                            Lab Results  Component Value Date   WBC 6.4 02/20/2016   HGB 13.2 02/20/2016   HCT 41.1 02/20/2016   MCV 88.0 02/20/2016   PLT 356 02/20/2016   Lab Results  Component Value Date   CREATININE 1.41 (H) 02/20/2016   BUN 17 02/20/2016   NA 139 02/20/2016   K 3.8 02/20/2016   CL 105 02/20/2016   CO2 27 02/20/2016   No results found for: INR, PROTIME  01/2016 EKG: normal sinus rhythm.   Anesthesia Physical Anesthesia Plan  ASA: II  Anesthesia Plan: General   Post-op Pain Management: GA combined w/ Regional for post-op pain   Induction: Intravenous  Airway Management Planned: LMA  Additional Equipment:   Intra-op Plan:   Post-operative Plan: Extubation in OR  Informed Consent: I have reviewed the patients History and Physical, chart, labs and discussed the procedure including the risks, benefits and alternatives for the proposed anesthesia with the patient or authorized representative who has  indicated his/her understanding and acceptance.   Dental advisory given  Plan Discussed with: CRNA  Anesthesia Plan Comments:         Anesthesia Quick Evaluation

## 2016-03-12 NOTE — Progress Notes (Signed)
Assisted Dr. Hollis with left, ultrasound guided, pectoralis block. Side rails up, monitors on throughout procedure. See vital signs in flow sheet. Tolerated Procedure well. 

## 2016-03-13 NOTE — Addendum Note (Signed)
Addendum  created 03/13/16 1255 by Marrianne Mood, CRNA   Charge Capture section accepted

## 2016-03-15 ENCOUNTER — Encounter (HOSPITAL_BASED_OUTPATIENT_CLINIC_OR_DEPARTMENT_OTHER): Payer: Self-pay | Admitting: General Surgery

## 2016-03-19 ENCOUNTER — Ambulatory Visit (HOSPITAL_BASED_OUTPATIENT_CLINIC_OR_DEPARTMENT_OTHER): Payer: Medicare Other | Admitting: Hematology and Oncology

## 2016-03-19 ENCOUNTER — Telehealth: Payer: Self-pay | Admitting: *Deleted

## 2016-03-19 ENCOUNTER — Encounter: Payer: Self-pay | Admitting: Hematology and Oncology

## 2016-03-19 ENCOUNTER — Encounter: Payer: Self-pay | Admitting: *Deleted

## 2016-03-19 DIAGNOSIS — Z170 Estrogen receptor positive status [ER+]: Secondary | ICD-10-CM | POA: Diagnosis not present

## 2016-03-19 DIAGNOSIS — C50412 Malignant neoplasm of upper-outer quadrant of left female breast: Secondary | ICD-10-CM

## 2016-03-19 NOTE — Assessment & Plan Note (Signed)
Left lumpectomy 02/27/2016: IDC with papillary features, grade 2, 1.1 cm, ADH, superior/medial margin positive, ER 95%, PR 95%, Ki-67 5%, HER-2 negative ratio 1.13,  Margin re-excision 03/12/2016: Negative for cancer, 0/2 lymph nodes negative Pathologic staging: T1c N0 stage IA  Pathology and radiology counseling: Discussed with the patient, the details of pathology including the type of breast cancer,the clinical staging, the significance of ER, PR and HER-2/neu receptors and the implications for treatment. After reviewing the pathology in detail, we proceeded to discuss the different treatment options between radiation, chemotherapy, antiestrogen therapies.  Recommendation: 1. Oncotype DX testing to determine if she would benefit from chemotherapy 2. adjuvant radiation therapy followed by 3. Adjuvant antiestrogen therapy with anastrozole 1 mg daily 5 years  Oncotype counseling: I discussed Oncotype DX test. I explained to the patient that this is a 21 gene panel to evaluate patient tumors DNA to calculate recurrence score. This would help determine whether patient has high risk or intermediate risk or low risk breast cancer. She understands that if her tumor was found to be high risk, she would benefit from systemic chemotherapy. If low risk, no need of chemotherapy. If she was found to be intermediate risk, we would need to evaluate the score as well as other risk factors and determine if an abbreviated chemotherapy may be of benefit.  Return to clinic based upon Oncotype DX score

## 2016-03-19 NOTE — Telephone Encounter (Signed)
Received order per Dr. Gudena for oncotype testing. Requisition sent to pathology. Received by Keisha 

## 2016-03-19 NOTE — Progress Notes (Signed)
[facility] CONSULT NOTE  Patient Care Team: Leighton Ruff, MD as PCP - General (Family Medicine)  CHIEF COMPLAINTS/PURPOSE OF CONSULTATION:  Newly diagnosed breast cancer  HISTORY OF PRESENTING ILLNESS:  [name] 75 y.o. female is here because of recent diagnosis of left breast cancer. She originally had an abnormality on the mammogram which was then biopsied. The initial biopsy on 01/02/2016 showed that it was a ductal papilloma with UDH. She subsequently underwent lumpectomy on 02/27/2016 and it revealed invasive ductal carcinoma with papillary features that was grade 2 measuring 1.1 cm that was ER/PR positive HER-2 negative with a Ki-67 of 5%. She underwent reexcision of the margins on 03/12/2016 along with lymph node evaluation. 2 lymph nodes were negative. She has been sent to [place] for discussion regarding adjuvant treatment options. She is recovering and healing very well from the prior surgeries. She is active by her husband today.  I reviewed her records extensively and collaborated the history with the patient.  SUMMARY OF ONCOLOGIC HISTORY:   Breast cancer of upper-outer quadrant of left female breast ([place])   01/02/2016 Initial Biopsy    Left breast biopsy 1:00: Ductal papilloma with [facility]      02/27/2016 Surgery    Left lumpectomy: IDC with papillary features, grade 2, 1.1 cm, ADH, superior/medial margin positive, ER 95%, PR 95%, Ki-67 5%, HER-2 negative ratio 1.13, T1c N0 stage IA      03/12/2016 Surgery    Left superior and medial margin reexcision: Negative for malignancy, 0/2 lymph nodes       MEDICAL HISTORY:  Past Medical History:  Diagnosis Date  . Chronic renal insufficiency 10/09/2014  . Dehydration 10/09/2014  . Essential hypertension 10/09/2014  . Hypertension   . Pre-diabetes   . Wears glasses     SURGICAL HISTORY: Past Surgical History:  Procedure Laterality Date  . ABDOMINAL HYSTERECTOMY    . BREAST LUMPECTOMY WITH RADIOACTIVE SEED  LOCALIZATION Left 02/27/2016   Procedure: LEFT BREAST LUMPECTOMY WITH RADIOACTIVE SEED LOCALIZATION;  Surgeon: Autumn Messing III, MD;  Location: [facility];  Service: General;  Laterality: Left;  . BREAST LUMPECTOMY WITH SENTINEL LYMPH NODE BIOPSY Left 03/12/2016   Procedure: RE-EXCISION OF LEFT BREAST SUPERIOR  AND MEDIAL MARGIN  WITH SENTINEL LYMPH NODE BX;  Surgeon: Autumn Messing III, MD;  Location: [facility];  Service: General;  Laterality: Left;  . CHOLECYSTECTOMY    . COLON SURGERY    . GASTROSTOMY N/A 10/04/2014   Procedure: GASTROSTOMY;  Surgeon: Autumn Messing III, MD;  Location: [facility];  Service: General;  Laterality: N/A;  . PARTIAL COLECTOMY    . VENTRAL HERNIA REPAIR N/A 10/04/2014   Procedure: DIAPHRAGMATIC HERNIA REPAIR;  Surgeon: Autumn Messing III, MD;  Location: [facility];  Service: General;  Laterality: N/A;    SOCIAL HISTORY: Social History   Social History  . Marital status: Married    Spouse name: N/A  . Number of children: N/A  . Years of education: N/A   Occupational History  . Not on file.   Social History Main Topics  . Smoking status: Never Smoker  . Smokeless tobacco: Never Used  . Alcohol use No  . Drug use: No  . Sexual activity: Not on file   Other Topics Concern  . Not on file   Social History Narrative  . No narrative on file    FAMILY HISTORY: No family history on file.  ALLERGIES:  is allergic to hydrocodone-acetaminophen.  MEDICATIONS:  Current Outpatient  Prescriptions  Medication Sig Dispense Refill  . acetaminophen (TYLENOL) 500 MG tablet Take 500 mg by mouth daily as needed for headache.    Marland Kitchen amLODipine (NORVASC) 5 MG tablet Take 5 mg by mouth daily.    Marland Kitchen aspirin EC 81 MG tablet Take 81 mg by mouth daily.    Marland Kitchen HYDROcodone-acetaminophen (NORCO/VICODIN) 5-325 MG tablet Take 1-2 tablets by mouth every 4 (four) hours as needed for moderate pain or severe pain. 20 tablet 0  . HYDROcodone-acetaminophen (NORCO/VICODIN) 5-325 MG tablet Take 1-2  tablets by mouth every 4 (four) hours as needed for moderate pain or severe pain. 20 tablet 0  . Multiple Vitamin (MULTIVITAMIN WITH MINERALS) TABS tablet Take 1 tablet by mouth daily.    . traMADol (ULTRAM) 50 MG tablet Take 1 tablet (50 mg total) by mouth every 6 (six) hours as needed. 20 tablet 0   No current facility-administered medications for this visit.     REVIEW OF SYSTEMS:   Constitutional: Denies fevers, chills or abnormal night sweats Eyes: Denies blurriness of vision, double vision or watery eyes Ears, nose, mouth, throat, and face: Denies mucositis or sore throat Respiratory: Denies cough, dyspnea or wheezes Cardiovascular: Denies palpitation, chest discomfort or lower extremity swelling Gastrointestinal:  Denies nausea, heartburn or change in bowel habits Skin: Denies abnormal skin rashes Lymphatics: Denies new lymphadenopathy or easy bruising Neurological:Denies numbness, tingling or new weaknesses Behavioral/Psych: Mood is stable, no new changes  Breast: Recent lumpectomy left breast All other systems were reviewed with the patient and are negative.  PHYSICAL EXAMINATION: ECOG PERFORMANCE STATUS: 1 - Symptomatic but completely ambulatory  Vitals:   03/19/16 1025  BP: (!) 175/86  Pulse: 75  Resp: 19  Temp: 98 F (36.7 C)   Filed Weights   03/19/16 1025  Weight: 208 lb 1.6 oz (94.4 kg)    GENERAL:alert, no distress and comfortable SKIN: skin color, texture, turgor are normal, no rashes or significant lesions EYES: normal, conjunctiva are pink and non-injected, sclera clear OROPHARYNX:no exudate, no erythema and lips, buccal mucosa, and tongue normal  NECK: supple, thyroid normal size, non-tender, without nodularity LYMPH:  no palpable lymphadenopathy in the cervical, axillary or inguinal LUNGS: clear to auscultation and percussion with normal breathing effort HEART: regular rate & rhythm and no murmurs and no lower extremity edema ABDOMEN:abdomen soft,  non-tender and normal bowel sounds Musculoskeletal:no cyanosis of digits and no clubbing  PSYCH: alert & oriented x 3 with fluent speech NEURO: no focal motor/sensory deficits  LABORATORY DATA:  I have reviewed the data as listed Lab Results  Component Value Date   WBC 6.4 02/20/2016   HGB 13.2 02/20/2016   HCT 41.1 02/20/2016   MCV 88.0 02/20/2016   PLT 356 02/20/2016   Lab Results  Component Value Date   NA 139 02/20/2016   K 3.8 02/20/2016   CL 105 02/20/2016   CO2 27 02/20/2016    RADIOGRAPHIC STUDIES: I have personally reviewed the radiological reports and agreed with the findings in the report.  ASSESSMENT AND PLAN:  Breast cancer of upper-outer quadrant of left female breast ([place]) Left lumpectomy 02/27/2016: IDC with papillary features, grade 2, 1.1 cm, ADH, superior/medial margin positive, ER 95%, PR 95%, Ki-67 5%, HER-2 negative ratio 1.13,  Margin re-excision 03/12/2016: Negative for cancer, 0/2 lymph nodes negative Pathologic staging: T1c N0 stage IA  Pathology and radiology counseling: Discussed with the patient, the details of pathology including the type of breast cancer,the clinical staging, the significance of ER,  PR and HER-2/neu receptors and the implications for treatment. After reviewing the pathology in detail, we proceeded to discuss the different treatment options between radiation, chemotherapy, antiestrogen therapies.  Recommendation: 1. Oncotype DX testing to determine if she would benefit from chemotherapy 2. adjuvant radiation therapy followed by 3. Adjuvant antiestrogen therapy with anastrozole 1 mg daily 5 years  Oncotype counseling: I discussed Oncotype DX test. I explained to the patient that this is a 21 gene panel to evaluate patient tumors DNA to calculate recurrence score. This would help determine whether patient has high risk or intermediate risk or low risk breast cancer. She understands that if her tumor was found to be high risk, she  would benefit from systemic chemotherapy. If low risk, no need of chemotherapy. If she was found to be intermediate risk, we would need to evaluate the score as well as other risk factors and determine if an abbreviated chemotherapy may be of benefit.  Return to clinic based upon Oncotype DX score   All questions were answered. The patient knows to call the clinic with any problems, questions or concerns.    Rulon Eisenmenger, MD 03/19/16

## 2016-03-23 ENCOUNTER — Ambulatory Visit: Payer: Medicare Other | Admitting: Radiation Oncology

## 2016-03-23 ENCOUNTER — Ambulatory Visit: Payer: Medicare Other

## 2016-03-23 NOTE — Addendum Note (Signed)
Addendum  created 03/23/16 1034 by Finis Bud, MD   Delete clinical note

## 2016-03-25 ENCOUNTER — Encounter: Payer: Self-pay | Admitting: Radiation Oncology

## 2016-03-25 NOTE — Progress Notes (Addendum)
Location of Breast Cancer: Left Breast Upper Outer Quadrant 1 o' clock position  Histology per Pathology Report: 01/02/2016: Diagnosis 01/02/2016: Breast, left, needle core biopsy, 1 o'clock DUCTAL PAPILLOMA WITH USUAL DUCTAL HYPERPLASIA Receptor Status: ER(95%+), PR (95%+), Her2-neu (neg.) ratio 1.13, Ki-(5%)  Did patient present with symptoms (if so, please note symptoms) or was this found on screening mammography?: routine mammogram screening  Past/Anticipated interventions by surgeon, if OYO:[id number] 03/12/2016: Dr. Dr. Autumn Messing III,MD 1. Breast, excision, Left Superior Margin - FIBROCYSTIC CHANGES.- NO EVIDENCE OF MALIGNANCY.- PREVIOUS BIOPSY SITE.- FINAL SUPERIOR MARGIN CLEAR. 2. Breast, excision, Left Medial Margin - FIBROCYSTIC CHANGES.- NO EVIDENCE OF MALIGNANCY.- PREVIOUS BIOPSY SITE.- FINAL MEDIAL MARGIN CLEAR. 3. Lymph node, sentinel, biopsy, Left Axillary - ONE BENIGN LYMPH NODE (0/1). 4. Lymph node, sentinel, biopsy, Left Axillary - ONE BENIGN LYMPH NODE (0/1).  Diagnosis 02/27/2016: Breast, lumpectomy, Left - INVASIVE DUCTAL CARCINOMA WITH PAPILLARY FEATURES, GRADE II/III, SPANNING 1.1 CM.- ATYPICAL DUCTAL HYPERPLASIA.- INVASIVE CARCINOMA IS BROADLY PRESENT AT THE SUPERIOR/MEDIAL MARGIN.- SEE ONCOLOGY TABLE BELOW.BREAST, INVASIVE TUMOR, WITHOUT LYMPH NODES PRESENT  Past/Anticipated interventions by medical oncology, if any: Chemotherapy : Dr. [name], MD 03/12/2016=oncotype testing  F/u results oncotype score Lymphedema issues, if any:  No  Pain issues, if any:  No  SAFETY ISSUES:  Prior radiation? NO  Pacemaker/ICD? NO  Possible current pregnancy? N/A  Is the patient on methotrexate? No  Current Complaints / other details:  Married, HX Cholecystectomy,gastrostomy,colon surgery,partil colectomy,ventral hernia repair,chronic renal insufficiency Non smoker, no smokeless tobacco use, no alcohol or drug use  Allergies: Hydrocodone-acetaminophen  Menarche 14,G2,P2, BC 3  years, HRT 1 year  BP (!) 146/93 (BP Location: Right Arm, Patient Position: Sitting, Cuff Size: Large)   Pulse 75   Temp 98 F (36.7 C) (Oral)   Resp 18   Ht '5\' 8"'  (1.727 m)   Wt 209 lb 8 oz (95 kg)   SpO2 96%   BMI 31.85 kg/m    [name], RN 03/25/2016,7:15 AM  [phone]

## 2016-03-26 ENCOUNTER — Ambulatory Visit
Admission: RE | Admit: 2016-03-26 | Discharge: 2016-03-26 | Disposition: A | Discharge status: Home / Self Care | Payer: Medicare Other | Source: Ambulatory Visit | Attending: Radiation Oncology | Admitting: Radiation Oncology

## 2016-03-26 VITALS — BP 146/93 | HR 75 | Temp 98.0°F | Resp 18 | Ht 68.0 in | Wt 209.5 lb

## 2016-03-26 DIAGNOSIS — E860 Dehydration: Secondary | ICD-10-CM | POA: Insufficient documentation

## 2016-03-26 DIAGNOSIS — I129 Hypertensive chronic kidney disease with stage 1 through stage 4 chronic kidney disease, or unspecified chronic kidney disease: Secondary | ICD-10-CM | POA: Insufficient documentation

## 2016-03-26 DIAGNOSIS — Z510 Encounter for antineoplastic radiation therapy: Secondary | ICD-10-CM | POA: Diagnosis not present

## 2016-03-26 DIAGNOSIS — Z170 Estrogen receptor positive status [ER+]: Secondary | ICD-10-CM | POA: Diagnosis not present

## 2016-03-26 DIAGNOSIS — Z7982 Long term (current) use of aspirin: Secondary | ICD-10-CM | POA: Insufficient documentation

## 2016-03-26 DIAGNOSIS — R7303 Prediabetes: Secondary | ICD-10-CM | POA: Insufficient documentation

## 2016-03-26 DIAGNOSIS — C50412 Malignant neoplasm of upper-outer quadrant of left female breast: Secondary | ICD-10-CM

## 2016-03-26 DIAGNOSIS — Z9049 Acquired absence of other specified parts of digestive tract: Secondary | ICD-10-CM | POA: Insufficient documentation

## 2016-03-26 DIAGNOSIS — N189 Chronic kidney disease, unspecified: Secondary | ICD-10-CM | POA: Insufficient documentation

## 2016-03-26 DIAGNOSIS — Z9889 Other specified postprocedural states: Secondary | ICD-10-CM | POA: Insufficient documentation

## 2016-03-26 DIAGNOSIS — Z888 Allergy status to other drugs, medicaments and biological substances status: Secondary | ICD-10-CM | POA: Diagnosis not present

## 2016-03-26 DIAGNOSIS — Z79899 Other long term (current) drug therapy: Secondary | ICD-10-CM | POA: Insufficient documentation

## 2016-03-26 HISTORY — DX: Malignant neoplasm of unspecified site of unspecified female breast: C50.919

## 2016-03-26 HISTORY — DX: Allergy, unspecified, initial encounter: T78.40XA

## 2016-03-26 NOTE — Progress Notes (Signed)
Radiation Oncology         [phone]) [phone] ________________________________  Name: [name] MRN: [id number]  Date: 03/26/2016  DOB: 1940-12-29  WF:UXNATF,TDDUKGURK STEWART, MD  [name], MD     REFERRING PHYSICIAN: Autumn Messing III, MD   DIAGNOSIS: The encounter diagnosis was Breast cancer of upper-outer quadrant of left female breast ([place]).   HISTORY OF PRESENT ILLNESS::[name] is a 75 y.o. female who is seen for an initial consultation visit. She had a left breast needle core biopsy at 1 o'clock on 01/02/2016 following her routine mammogram screening. This biopsy revealed ductal papilloma with usual ductal hyperplasia. Receptor status is ER (95%), PR (95%), Her2(-) ratio 1.13, Ki (5%). Patient underwent left breast lumpectomy on 02/27/16. This revealed invasive ductile carcinoma with papillary features, grade II/III spanning 1.1cm- atypical hyperplasia. Invasive carcinoma is broadly present at the superior/medial margin. She underwent a breast excision and sentinel lymph node evaluation performed on 03/12/16 by Dr. Autumn Messing III, MD The breast excision of the left superior margin revealed fibrocystic changes- no evidence of malignancy, and final superior margin clear. The left medial margin breast excision revealed fibrocystic changes- no evidence of malignancy, and final medial margin clear. The sentinel lymph node biopsy of the left axillary revealed one benign lymph node. Another sentinel lymph node biopsy revealed on benign lymph node. Patient met with Dr. [name] and had oncotype testing on 03/12/16, which is still pending.   PREVIOUS RADIATION THERAPY: No   PAST MEDICAL HISTORY:  Past Medical History:  Diagnosis Date  . Allergy   . Breast cancer ([place]) 01/02/2016   left breast  . Chronic renal insufficiency 10/09/2014  . Dehydration 10/09/2014  . Essential hypertension 10/09/2014  . Hypertension   . Pre-diabetes   . Wears glasses       PAST SURGICAL HISTORY: Past Surgical  History:  Procedure Laterality Date  . ABDOMINAL HYSTERECTOMY    . BREAST LUMPECTOMY WITH RADIOACTIVE SEED LOCALIZATION Left 02/27/2016   Procedure: LEFT BREAST LUMPECTOMY WITH RADIOACTIVE SEED LOCALIZATION;  Surgeon: Autumn Messing III, MD;  Location: [facility];  Service: General;  Laterality: Left;  . BREAST LUMPECTOMY WITH SENTINEL LYMPH NODE BIOPSY Left 03/12/2016   Procedure: RE-EXCISION OF LEFT BREAST SUPERIOR  AND MEDIAL MARGIN  WITH SENTINEL LYMPH NODE BX;  Surgeon: Autumn Messing III, MD;  Location: [facility];  Service: General;  Laterality: Left;  . CHOLECYSTECTOMY    . COLON SURGERY    . GASTROSTOMY N/A 10/04/2014   Procedure: GASTROSTOMY;  Surgeon: Autumn Messing III, MD;  Location: [facility];  Service: General;  Laterality: N/A;  . PARTIAL COLECTOMY    . VENTRAL HERNIA REPAIR N/A 10/04/2014   Procedure: DIAPHRAGMATIC HERNIA REPAIR;  Surgeon: Autumn Messing III, MD;  Location: [facility];  Service: General;  Laterality: N/A;     FAMILY HISTORY:History reviewed. No pertinent family history.   SOCIAL HISTORY:  reports that she has never smoked. She has never used smokeless tobacco. She reports that she does not drink alcohol or use drugs.  The patient is married and resides in [place]. She is retired. Her husband works for the [employer].  ALLERGIES: Hydrocodone-acetaminophen   MEDICATIONS:  Current Outpatient Prescriptions  Medication Sig Dispense Refill  . amLODipine (NORVASC) 5 MG tablet Take 5 mg by mouth daily.    Marland Kitchen aspirin EC 81 MG tablet Take 81 mg by mouth daily.    . Cholecalciferol (VITAMIN D3) 2000 units capsule Take 2,000 Units by mouth  daily.    . Multiple Vitamin (MULTIVITAMIN WITH MINERALS) TABS tablet Take 1 tablet by mouth daily.    Marland Kitchen acetaminophen (TYLENOL) 500 MG tablet Take 500 mg by mouth daily as needed for headache.    Marland Kitchen HYDROcodone-acetaminophen (NORCO/VICODIN) 5-325 MG tablet Take 1-2 tablets by mouth every 4 (four) hours as needed for moderate  pain or severe pain. (Patient not taking: Reported on 03/25/2016) 20 tablet 0  . HYDROcodone-acetaminophen (NORCO/VICODIN) 5-325 MG tablet Take 1-2 tablets by mouth every 4 (four) hours as needed for moderate pain or severe pain. (Patient not taking: Reported on 03/25/2016) 20 tablet 0  . traMADol (ULTRAM) 50 MG tablet Take 1 tablet (50 mg total) by mouth every 6 (six) hours as needed. (Patient not taking: Reported on 03/26/2016) 20 tablet 0   No current facility-administered medications for this encounter.      REVIEW OF SYSTEMS:   On review of systems, the patient reports that she is doing well overall. She denies any chest pain, shortness of breath, cough, fevers, chills, night sweats, unintended weight changes. She denies any bowel or bladder disturbances, and denies abdominal pain, nausea or vomiting. She denies any new musculoskeletal or joint aches or pains. A complete review of systems is obtained and is otherwise negative.  PHYSICAL EXAM:  height is '5\' 8"'$  (1.727 m) and weight is 209 lb 8 oz (95 kg). Her oral temperature is 98 F (36.7 C). Her blood pressure is 146/93 (abnormal) and her pulse is 75. Her respiration is 18 and oxygen saturation is 96%.   In general this is a well appearing african american woman in no acute distress. She is alert and oriented x4 and appropriate throughout the examination. HEENT reveals that the patient is normocephalic, atraumatic. EOMs are intact. PERRLA. Skin is intact without any evidence of gross lesions. Cardiovascular exam reveals a regular rate and rhythm, no clicks rubs or murmurs are auscultated. Chest is clear to auscultation bilaterally. Lymphatic assessment is performed and does not reveal any adenopathy in the cervical, supraclavicular, axillary, or inguinal chains. The left breast is assessed and reveals a well healed lumpectomy scar and axillary scar. There is palpable fullness in the lumpectomy cavity without fluctuance. No cellulitic change is  present. The right breast is also intact without palpable mass. Abdomen has active bowel sounds in all quadrants and is intact. The abdomen is soft, non tender, non distended. Lower extremities are negative for pretibial pitting edema, deep calf tenderness, cyanosis or clubbing.  ECOG = 0  0 - Asymptomatic (Fully active, able to carry on all predisease activities without restriction)  1 - Symptomatic but completely ambulatory (Restricted in physically strenuous activity but ambulatory and able to carry out work of a light or sedentary nature. For example, light housework, office work)  2 - Symptomatic, <50% in bed during the day (Ambulatory and capable of all self care but unable to carry out any work activities. Up and about more than 50% of waking hours)  3 - Symptomatic, >50% in bed, but not bedbound (Capable of only limited self-care, confined to bed or chair 50% or more of waking hours)  4 - Bedbound (Completely disabled. Cannot carry on any self-care. Totally confined to bed or chair)  5 - Death   Eustace Pen MM, Creech RH, Tormey DC, et al. [phone]). "Toxicity and response criteria of the [facility]". [place] Oncol. 5 (6): 649-55    LABORATORY DATA:  Lab Results  Component Value Date  WBC 6.4 02/20/2016   HGB 13.2 02/20/2016   HCT 41.1 02/20/2016   MCV 88.0 02/20/2016   PLT 356 02/20/2016   Lab Results  Component Value Date   NA 139 02/20/2016   K 3.8 02/20/2016   CL 105 02/20/2016   CO2 27 02/20/2016   Lab Results  Component Value Date   ALT 18 10/02/2014   AST 28 10/02/2014   ALKPHOS 58 10/02/2014   BILITOT 0.7 10/02/2014      RADIOGRAPHY: Nm Sentinel Node Inj-no Rpt (breast)  Result Date: 03/12/2016 CLINICAL DATA: left breast cancer Sulfur colloid was injected intradermally by the nuclear medicine technologist for breast cancer sentinel node localization.   Mm Breast Surgical Specimen  Result Date: 02/27/2016 CLINICAL DATA:  Status  post left excisional biopsy for a papilloma EXAM: SPECIMEN RADIOGRAPH OF THE LEFT BREAST COMPARISON:  Previous exam(s). FINDINGS: Status post excision of the left breast. The radioactive seed and biopsy marker clip are present, completely intact, and were marked for pathology. IMPRESSION: Specimen radiograph of the left breast. Electronically Signed   By: Pamelia Hoit M.D.   On: 02/27/2016 14:17   Mm Lt Radioactive Seed Loc Mammo Guide  Result Date: 02/25/2016 CLINICAL DATA:  75 year old female presenting for seed localization of a left breast papilloma. EXAM: MAMMOGRAPHIC GUIDED RADIOACTIVE SEED LOCALIZATION OF THE LEFT BREAST COMPARISON:  Previous exam(s). FINDINGS: Patient presents for radioactive seed localization prior to excisional biopsy. I met with the patient and we discussed the procedure of seed localization including benefits and alternatives. We discussed the high likelihood of a successful procedure. We discussed the risks of the procedure including infection, bleeding, tissue injury and further surgery. We discussed the low dose of radioactivity involved in the procedure. Informed, written consent was given. The usual time-out protocol was performed immediately prior to the procedure. Using mammographic guidance, sterile technique, 1% lidocaine and an I-125 radioactive seed, the papilloma in the left breast at 1 o'clock was localized using a lateral approach. The follow-up mammogram images confirm the seed in the expected location and were marked for Dr. Marlou Starks. Follow-up survey of the patient confirms presence of the radioactive seed. Order number of I-125 seed:  [id number]. Total activity:  0.248 mCi  Reference Date: 02/05/2016 The patient tolerated the procedure well and was released from the [facility]. She was given instructions regarding seed removal. IMPRESSION: Radioactive seed localization left breast. No apparent complications. Electronically Signed   By: Ammie Ferrier M.D.   On:  02/25/2016 14:51       IMPRESSION/PLAN: 1. Stage IA, T1c, N0, M0, ER/PR positive invasive ductal carcinoma of the left breast. Dr. Lisbeth Renshaw discusses the findings on pathology and reviews her history. He discusses the nature of invasive breast disease. He recommends proceeding with radiotherapy about 4-6 weeks after surgery provided that her oncotype score does not reveal a risk for recurrence to warrant chemotherapy. I will call her regarding these results as well, and we would postpone radiation if she needed chemotherapy.  We discussed the risks, benefits, short, and long term effects of radiotherapy, and the patient is interested in proceeding.  Dr. Lisbeth Renshaw discusses the delivery and logistics of radiotherapy. We have scheduled simulation for 04/08/16, and would anticipate starting treatment on 04/20/16 as she will be out of town the week following simulation.  The above documentation reflects my direct findings during this shared patient visit. Please see the separate note by Dr. Lisbeth Renshaw on this date for the remainder of the patient's plan of care.  Carola Rhine, PAC

## 2016-03-26 NOTE — Addendum Note (Signed)
Encounter addended by: Kyung Rudd, MD on: 03/26/2016 11:52 AM<BR>    Actions taken: Edit attestation on clinical note

## 2016-03-26 NOTE — Addendum Note (Signed)
Encounter addended by: Malena Edman, RN on: 03/26/2016 11:42 AM<BR>    Actions taken: Charge Capture section accepted

## 2016-03-27 ENCOUNTER — Encounter (HOSPITAL_COMMUNITY): Payer: Self-pay

## 2016-03-30 ENCOUNTER — Ambulatory Visit: Payer: Medicare Other | Admitting: Radiation Oncology

## 2016-03-30 ENCOUNTER — Institutional Professional Consult (permissible substitution): Payer: Medicare Other | Admitting: Radiation Oncology

## 2016-03-30 ENCOUNTER — Telehealth: Payer: Self-pay | Admitting: Radiation Oncology

## 2016-03-30 NOTE — Telephone Encounter (Signed)
I spoke with the patient's husband who would like a note for his work purposes. His wife's Oncotype score was 0, so no need for chemotherapy. We will proceed with planning on 04/08/16

## 2016-03-31 ENCOUNTER — Encounter: Payer: Self-pay | Admitting: Radiation Oncology

## 2016-04-02 ENCOUNTER — Telehealth: Payer: Self-pay | Admitting: *Deleted

## 2016-04-02 NOTE — Telephone Encounter (Signed)
Received Oncotype results of 0.  Discussed with schedule appt with Dr. [name] after xrt. Received verbal understanding. Denies further needs at this time. Physician team notified

## 2016-04-03 ENCOUNTER — Telehealth: Payer: Self-pay | Admitting: Hematology and Oncology

## 2016-04-03 NOTE — Telephone Encounter (Signed)
Faxed pt office note to [facility] [phone]

## 2016-04-08 ENCOUNTER — Ambulatory Visit
Admission: RE | Admit: 2016-04-08 | Discharge: 2016-04-08 | Disposition: A | Discharge status: Home / Self Care | Payer: Medicare Other | Source: Ambulatory Visit | Attending: Radiation Oncology | Admitting: Radiation Oncology

## 2016-04-08 DIAGNOSIS — Z510 Encounter for antineoplastic radiation therapy: Secondary | ICD-10-CM | POA: Diagnosis not present

## 2016-04-08 DIAGNOSIS — Z170 Estrogen receptor positive status [ER+]: Principal | ICD-10-CM

## 2016-04-08 DIAGNOSIS — C50412 Malignant neoplasm of upper-outer quadrant of left female breast: Secondary | ICD-10-CM

## 2016-04-08 NOTE — Progress Notes (Signed)
  Radiation Oncology         [phone] ________________________________  Name: [name] MRN: [id number]  Date: 04/08/2016  DOB: April 19, 1941   DIAGNOSIS:     ICD-9-CM ICD-10-CM   1. Malignant neoplasm of upper-outer quadrant of left breast in female, estrogen receptor positive ([place]) 174.4 C50.412    V86.0 Z17.0     SIMULATION AND TREATMENT PLANNING NOTE  The patient presented for simulation prior to beginning her course of radiation treatment for her diagnosis of left-sided breast cancer. The patient was placed in a supine position on a breast board. A customized vac-lock bag was constructed and this complex treatment device will be used on a daily basis during her treatment. In this fashion, a CT scan was obtained through the chest area and an isocenter was placed near the chest wall within the breast.  The patient will be planned to receive a course of radiation initially to a dose of 42.5 Gy. This will consist of a whole breast radiotherapy technique. To accomplish this, 2 customized blocks have been designed which will correspond to medial and lateral whole breast tangent fields. This treatment will be accomplished at 2.5 Gy per fraction. A forward planning technique will also be evaluated to determine if this approach improves the plan. It is anticipated that the patient will then receive a 7.5 Gy boost to the seroma cavity which has been contoured. This will be accomplished at 2.5 Gy per fraction.   This initial treatment will consist of a 3-D conformal technique. The seroma has been contoured as the primary target structure. Additionally, dose volume histograms of both this target as well as the lungs and heart will also be evaluated. Such an approach is necessary to ensure that the target area is adequately covered while the nearby critical  normal structures are adequately spared.  Plan:  The final anticipated total dose therefore will correspond to 50 Gy.   Special treatment  procedure was performed today due to the extra time and effort required by myself to plan and prepare this patient for deep inspiration breath hold technique.  I have determined cardiac sparing to be of benefit to this patient to prevent long term cardiac damage due to radiation of the heart.  Bellows were placed on the patient's abdomen. To facilitate cardiac sparing, the patient was coached by the radiation therapists on breath hold techniques and breathing practice was performed. Practice waveforms were obtained. The patient was then scanned while maintaining breath hold in the treatment position.  This image was then transferred over to the imaging specialist. The imaging specialist then created a fusion of the free breathing and breath hold scans using the chest wall as the stable structure. I personally reviewed the fusion in axial, coronal and sagittal image planes.  Excellent cardiac sparing was obtained.  I felt the patient is an appropriate candidate for breath hold and the patient will be treated as such.  The image fusion was then reviewed with the patient to reinforce the necessity of reproducible breath hold.      _______________________________   Jodelle Gross, MD, PhD

## 2016-04-08 NOTE — Progress Notes (Signed)
  Radiation Oncology         [phone] ________________________________  Name: [name] MRN: [id number]  Date: 04/08/2016  DOB: 1940/11/14  Optical Surface Tracking Plan:  Since intensity modulated radiotherapy (IMRT) and 3D conformal radiation treatment methods are predicated on accurate and precise positioning for treatment, intrafraction motion monitoring is medically necessary to ensure accurate and safe treatment delivery.  The ability to quantify intrafraction motion without excessive ionizing radiation dose can only be performed with optical surface tracking. Accordingly, surface imaging offers the opportunity to obtain 3D measurements of patient position throughout IMRT and 3D treatments without excessive radiation exposure.  I am ordering optical surface tracking for this patient's upcoming course of radiotherapy. ________________________________  Kyung Rudd, MD 04/08/2016 5:51 PM    Reference:   Ursula Alert, J, et al. Surface imaging-based analysis of intrafraction motion for breast radiotherapy patients.Journal of [place], n. 6, nov. 2014. ISSN [id number].   Available at: <http://www.jacmp.org/index.php/jacmp/article/view/4957>.

## 2016-04-09 DIAGNOSIS — Z510 Encounter for antineoplastic radiation therapy: Secondary | ICD-10-CM | POA: Diagnosis not present

## 2016-04-15 ENCOUNTER — Ambulatory Visit: Payer: Medicare Other | Admitting: Radiation Oncology

## 2016-04-16 ENCOUNTER — Ambulatory Visit: Payer: Medicare Other

## 2016-04-17 ENCOUNTER — Ambulatory Visit: Payer: Medicare Other

## 2016-04-20 ENCOUNTER — Ambulatory Visit
Admission: RE | Admit: 2016-04-20 | Discharge: 2016-04-20 | Disposition: A | Discharge status: Home / Self Care | Payer: Medicare Other | Source: Ambulatory Visit | Attending: Radiation Oncology | Admitting: Radiation Oncology

## 2016-04-20 ENCOUNTER — Ambulatory Visit: Payer: Medicare Other

## 2016-04-20 DIAGNOSIS — Z510 Encounter for antineoplastic radiation therapy: Secondary | ICD-10-CM | POA: Diagnosis not present

## 2016-04-21 ENCOUNTER — Ambulatory Visit
Admission: RE | Admit: 2016-04-21 | Discharge: 2016-04-21 | Disposition: A | Discharge status: Home / Self Care | Payer: Medicare Other | Source: Ambulatory Visit | Attending: Radiation Oncology | Admitting: Radiation Oncology

## 2016-04-21 ENCOUNTER — Ambulatory Visit: Payer: Medicare Other

## 2016-04-21 DIAGNOSIS — Z510 Encounter for antineoplastic radiation therapy: Secondary | ICD-10-CM | POA: Diagnosis not present

## 2016-04-22 ENCOUNTER — Ambulatory Visit
Admission: RE | Admit: 2016-04-22 | Discharge: 2016-04-22 | Disposition: A | Discharge status: Home / Self Care | Payer: Medicare Other | Source: Ambulatory Visit | Attending: Radiation Oncology | Admitting: Radiation Oncology

## 2016-04-22 ENCOUNTER — Ambulatory Visit: Payer: Medicare Other

## 2016-04-22 DIAGNOSIS — C50412 Malignant neoplasm of upper-outer quadrant of left female breast: Secondary | ICD-10-CM | POA: Diagnosis not present

## 2016-04-22 DIAGNOSIS — Z170 Estrogen receptor positive status [ER+]: Secondary | ICD-10-CM | POA: Diagnosis present

## 2016-04-22 DIAGNOSIS — Z510 Encounter for antineoplastic radiation therapy: Secondary | ICD-10-CM | POA: Diagnosis not present

## 2016-04-22 MED ORDER — RADIAPLEXRX EX GEL
Freq: Once | CUTANEOUS | Status: AC
  Administered 2016-04-22: 13:00:00 via TOPICAL

## 2016-04-22 MED ORDER — ALRA NON-METALLIC DEODORANT (RAD-ONC)
1.0000 "application " | Freq: Once | TOPICAL | Status: AC
  Administered 2016-04-22: 1 via TOPICAL

## 2016-04-22 NOTE — Progress Notes (Signed)
Patient education  breast, discussed ways to manage side effects, fatigue, sklin irritation, swelling, tenderness, gave Radiation therapy and you book, my business card, alra deodorant and radiaplex gel, sees MD fridays and prn, increase protein in diet, stay hydrated,drink plenty water, electric razor if used, to shave, no undr wire bras, may use corn starch under breast fold if sweating, after rad txs,verbal understanding , teach back given 12:49 PM

## 2016-04-23 ENCOUNTER — Ambulatory Visit
Admission: RE | Admit: 2016-04-23 | Discharge: 2016-04-23 | Disposition: A | Discharge status: Home / Self Care | Payer: Medicare Other | Source: Ambulatory Visit | Attending: Radiation Oncology | Admitting: Radiation Oncology

## 2016-04-23 DIAGNOSIS — Z510 Encounter for antineoplastic radiation therapy: Secondary | ICD-10-CM | POA: Diagnosis not present

## 2016-04-24 ENCOUNTER — Encounter: Payer: Self-pay | Admitting: Radiation Oncology

## 2016-04-24 ENCOUNTER — Ambulatory Visit
Admission: RE | Admit: 2016-04-24 | Discharge: 2016-04-24 | Disposition: A | Discharge status: Home / Self Care | Payer: Medicare Other | Source: Ambulatory Visit | Attending: Radiation Oncology | Admitting: Radiation Oncology

## 2016-04-24 ENCOUNTER — Inpatient Hospital Stay
Admission: RE | Admit: 2016-04-24 | Discharge: 2016-04-24 | Disposition: A | Discharge status: Home / Self Care | Payer: Self-pay | Source: Ambulatory Visit | Attending: Radiation Oncology | Admitting: Radiation Oncology

## 2016-04-24 VITALS — BP 170/85 | HR 66 | Temp 97.6°F | Resp 16 | Wt 207.4 lb

## 2016-04-24 DIAGNOSIS — Z170 Estrogen receptor positive status [ER+]: Principal | ICD-10-CM

## 2016-04-24 DIAGNOSIS — Z510 Encounter for antineoplastic radiation therapy: Secondary | ICD-10-CM | POA: Diagnosis not present

## 2016-04-24 DIAGNOSIS — C50412 Malignant neoplasm of upper-outer quadrant of left female breast: Secondary | ICD-10-CM

## 2016-04-24 NOTE — Progress Notes (Signed)
   Department of Radiation Oncology  Phone:  [phone] Fax:        [phone]  Weekly Treatment Note    Name: Monick Diffey Date: 04/24/2016 MRN: [id number] DOB: 01-04-1941   Diagnosis:     ICD-9-CM ICD-10-CM   1. Malignant neoplasm of upper-outer quadrant of left breast in female, estrogen receptor positive ([place]) 174.4 C50.412    V86.0 Z17.0      Current dose: 10 Gy  Current fraction:4   MEDICATIONS: Current Outpatient Prescriptions  Medication Sig Dispense Refill  . amLODipine (NORVASC) 5 MG tablet Take 5 mg by mouth daily.    Marland Kitchen aspirin EC 81 MG tablet Take 81 mg by mouth daily.    . Cholecalciferol (VITAMIN D3) 2000 units capsule Take 2,000 Units by mouth daily.    . hyaluronate sodium (RADIAPLEXRX) GEL Apply 1 application topically 2 (two) times daily.    . Multiple Vitamin (MULTIVITAMIN WITH MINERALS) TABS tablet Take 1 tablet by mouth daily.    . non-metallic deodorant Jethro Poling) MISC Apply 1 application topically daily as needed.    Marland Kitchen acetaminophen (TYLENOL) 500 MG tablet Take 500 mg by mouth daily as needed for headache.     No current facility-administered medications for this encounter.      ALLERGIES: Hydrocodone-acetaminophen   LABORATORY DATA:  Lab Results  Component Value Date   WBC 6.4 02/20/2016   HGB 13.2 02/20/2016   HCT 41.1 02/20/2016   MCV 88.0 02/20/2016   PLT 356 02/20/2016   Lab Results  Component Value Date   NA 139 02/20/2016   K 3.8 02/20/2016   CL 105 02/20/2016   CO2 27 02/20/2016   Lab Results  Component Value Date   ALT 18 10/02/2014   AST 28 10/02/2014   ALKPHOS 58 10/02/2014   BILITOT 0.7 10/02/2014     NARRATIVE: [name] was seen today for weekly treatment management. The chart was checked and the patient's films were reviewed.  Weekly rad txs left breast, 4/20 complete. Mild pink tinge on breast ,skin intact, using radiaplex bid. Good appetite, denies pain.  PHYSICAL EXAMINATION: weight is 207 lb 6.4 oz (94.1  kg). Her oral temperature is 97.6 F (36.4 C). Her blood pressure is 170/85 (abnormal) and her pulse is 66. Her respiration is 16.   ASSESSMENT: The patient is doing satisfactorily with treatment. The patient asked if she could get a flu shot and I said that this was appropriate. However, she has decided to wait getting the vaccine until she completes treatment.  PLAN: We will continue with the patient's radiation treatment as planned.  ------------------------------------------------  Jodelle Gross, MD, PhD  This document serves as a record of services personally performed by Kyung Rudd, MD. It was created on his behalf by Darcus Austin, a trained medical scribe. The creation of this record is based on the scribe's personal observations and the provider's statements to them. This document has been checked and approved by the attending provider.

## 2016-04-24 NOTE — Progress Notes (Signed)
Weekly rad txs  Left breast,  4/20 complete, mild pink tinge on breast ,skin intact, using radiaplex bid,   Appetite good, no pain 12:48 PM BP (!) 170/85 (BP Location: Right Arm, Patient Position: Sitting, Cuff Size: Normal)   Pulse 66   Temp 97.6 F (36.4 C) (Oral)   Resp 16   Wt 207 lb 6.4 oz (94.1 kg)   BMI 31.54 kg/m   Wt Readings from Last 3 Encounters:  04/24/16 207 lb 6.4 oz (94.1 kg)  03/26/16 209 lb 8 oz (95 kg)  03/19/16 208 lb 1.6 oz (94.4 kg)

## 2016-04-27 ENCOUNTER — Ambulatory Visit
Admission: RE | Admit: 2016-04-27 | Discharge: 2016-04-27 | Disposition: A | Discharge status: Home / Self Care | Payer: Medicare Other | Source: Ambulatory Visit | Attending: Radiation Oncology | Admitting: Radiation Oncology

## 2016-04-27 DIAGNOSIS — Z510 Encounter for antineoplastic radiation therapy: Secondary | ICD-10-CM | POA: Diagnosis not present

## 2016-04-28 ENCOUNTER — Ambulatory Visit
Admission: RE | Admit: 2016-04-28 | Discharge: 2016-04-28 | Disposition: A | Discharge status: Home / Self Care | Payer: Medicare Other | Source: Ambulatory Visit | Attending: Radiation Oncology | Admitting: Radiation Oncology

## 2016-04-28 DIAGNOSIS — Z510 Encounter for antineoplastic radiation therapy: Secondary | ICD-10-CM | POA: Diagnosis not present

## 2016-04-29 ENCOUNTER — Ambulatory Visit
Admission: RE | Admit: 2016-04-29 | Discharge: 2016-04-29 | Disposition: A | Discharge status: Home / Self Care | Payer: Medicare Other | Source: Ambulatory Visit | Attending: Radiation Oncology | Admitting: Radiation Oncology

## 2016-04-29 DIAGNOSIS — Z510 Encounter for antineoplastic radiation therapy: Secondary | ICD-10-CM | POA: Diagnosis not present

## 2016-04-30 ENCOUNTER — Ambulatory Visit
Admission: RE | Admit: 2016-04-30 | Discharge: 2016-04-30 | Disposition: A | Discharge status: Home / Self Care | Payer: Medicare Other | Source: Ambulatory Visit | Attending: Radiation Oncology | Admitting: Radiation Oncology

## 2016-04-30 DIAGNOSIS — Z510 Encounter for antineoplastic radiation therapy: Secondary | ICD-10-CM | POA: Diagnosis not present

## 2016-05-01 ENCOUNTER — Encounter: Payer: Self-pay | Admitting: Radiation Oncology

## 2016-05-01 ENCOUNTER — Ambulatory Visit
Admission: RE | Admit: 2016-05-01 | Discharge: 2016-05-01 | Disposition: A | Discharge status: Home / Self Care | Payer: Medicare Other | Source: Ambulatory Visit | Attending: Radiation Oncology | Admitting: Radiation Oncology

## 2016-05-01 VITALS — BP 140/83 | HR 75 | Temp 97.7°F | Resp 18 | Wt 205.4 lb

## 2016-05-01 DIAGNOSIS — Z170 Estrogen receptor positive status [ER+]: Principal | ICD-10-CM

## 2016-05-01 DIAGNOSIS — Z510 Encounter for antineoplastic radiation therapy: Secondary | ICD-10-CM | POA: Diagnosis not present

## 2016-05-01 DIAGNOSIS — C50412 Malignant neoplasm of upper-outer quadrant of left female breast: Secondary | ICD-10-CM

## 2016-05-01 NOTE — Progress Notes (Signed)
Weekly rd txs left breast 9/20 completed,  erythma on breast and under axilla and inframmary fold, no skin breakdown, uses radiaplex bid, appetite good No c/o pain, right foot swelling, takes lasix prn .BP 140/83 (BP Location: Right Arm, Patient Position: Sitting, Cuff Size: Normal)   Pulse 75   Temp 97.7 F (36.5 C) (Oral)   Resp 18   Wt 205 lb 6.4 oz (93.2 kg)   BMI 31.23 kg/m   Wt Readings from Last 3 Encounters:  05/01/16 205 lb 6.4 oz (93.2 kg)  04/24/16 207 lb 6.4 oz (94.1 kg)  03/26/16 209 lb 8 oz (95 kg)

## 2016-05-01 NOTE — Progress Notes (Signed)
   Department of Radiation Oncology  Phone:  [phone] Fax:        [phone]  Weekly Treatment Note    Name: [name] Date: 05/01/2016 MRN: [id number] DOB: Jan 22, 1941   Diagnosis:     ICD-9-CM ICD-10-CM   1. Malignant neoplasm of upper-outer quadrant of left breast in female, estrogen receptor positive ([place]) 174.4 C50.412    V86.0 Z17.0      Current dose: 22.5 Gy  Current fraction: 9   MEDICATIONS: Current Outpatient Prescriptions  Medication Sig Dispense Refill  . acetaminophen (TYLENOL) 500 MG tablet Take 500 mg by mouth daily as needed for headache.    Marland Kitchen amLODipine (NORVASC) 5 MG tablet Take 5 mg by mouth daily.    Marland Kitchen aspirin EC 81 MG tablet Take 81 mg by mouth daily.    . Cholecalciferol (VITAMIN D3) 2000 units capsule Take 2,000 Units by mouth daily.    . furosemide (LASIX) 40 MG tablet Take 40 mg by mouth as needed. Swelling    . hyaluronate sodium (RADIAPLEXRX) GEL Apply 1 application topically 2 (two) times daily.    . Multiple Vitamin (MULTIVITAMIN WITH MINERALS) TABS tablet Take 1 tablet by mouth daily.    . non-metallic deodorant Jethro Poling) MISC Apply 1 application topically daily as needed.    Marland Kitchen aspirin 81 MG tablet Take 81 mg by mouth daily.     No current facility-administered medications for this encounter.      ALLERGIES: Hydrocodone-acetaminophen   LABORATORY DATA:  Lab Results  Component Value Date   WBC 6.4 02/20/2016   HGB 13.2 02/20/2016   HCT 41.1 02/20/2016   MCV 88.0 02/20/2016   PLT 356 02/20/2016   Lab Results  Component Value Date   NA 139 02/20/2016   K 3.8 02/20/2016   CL 105 02/20/2016   CO2 27 02/20/2016   Lab Results  Component Value Date   ALT 18 10/02/2014   AST 28 10/02/2014   ALKPHOS 58 10/02/2014   BILITOT 0.7 10/02/2014     NARRATIVE: [name] was seen today for weekly treatment management. The chart was checked and the patient's films were reviewed.  Weekly rd txs left breast 9/20 completed. Erythma on  breast, under axilla, and inframmary fold. No skin breakdown, uses radiaplex bid. Appetite good. No c/o pain or discomfort. Right foot swelling, takes lasix PRN.  PHYSICAL EXAMINATION: weight is 205 lb 6.4 oz (93.2 kg). Her oral temperature is 97.7 F (36.5 C). Her blood pressure is 140/83 and her pulse is 75. Her respiration is 18.   ASSESSMENT: The patient is doing satisfactorily with treatment.  PLAN: We will continue with the patient's radiation treatment as planned.  ------------------------------------------------  Jodelle Gross, MD, PhD  This document serves as a record of services personally performed by Kyung Rudd, MD. It was created on his behalf by Darcus Austin, a trained medical scribe. The creation of this record is based on the scribe's personal observations and the provider's statements to them. This document has been checked and approved by the attending provider.

## 2016-05-04 ENCOUNTER — Encounter: Payer: Self-pay | Admitting: Radiation Oncology

## 2016-05-04 ENCOUNTER — Ambulatory Visit
Admission: RE | Admit: 2016-05-04 | Discharge: 2016-05-04 | Disposition: A | Discharge status: Home / Self Care | Payer: Medicare Other | Source: Ambulatory Visit | Attending: Radiation Oncology | Admitting: Radiation Oncology

## 2016-05-04 DIAGNOSIS — Z510 Encounter for antineoplastic radiation therapy: Secondary | ICD-10-CM | POA: Diagnosis not present

## 2016-05-05 ENCOUNTER — Ambulatory Visit
Admission: RE | Admit: 2016-05-05 | Discharge: 2016-05-05 | Disposition: A | Discharge status: Home / Self Care | Payer: Medicare Other | Source: Ambulatory Visit | Attending: Radiation Oncology | Admitting: Radiation Oncology

## 2016-05-05 DIAGNOSIS — Z510 Encounter for antineoplastic radiation therapy: Secondary | ICD-10-CM | POA: Diagnosis not present

## 2016-05-06 ENCOUNTER — Ambulatory Visit
Admission: RE | Admit: 2016-05-06 | Discharge: 2016-05-06 | Disposition: A | Discharge status: Home / Self Care | Payer: Medicare Other | Source: Ambulatory Visit | Attending: Radiation Oncology | Admitting: Radiation Oncology

## 2016-05-06 VITALS — BP 146/79 | HR 79 | Temp 97.7°F | Wt 206.8 lb

## 2016-05-06 DIAGNOSIS — C50412 Malignant neoplasm of upper-outer quadrant of left female breast: Secondary | ICD-10-CM

## 2016-05-06 DIAGNOSIS — Z170 Estrogen receptor positive status [ER+]: Principal | ICD-10-CM

## 2016-05-06 DIAGNOSIS — Z510 Encounter for antineoplastic radiation therapy: Secondary | ICD-10-CM | POA: Diagnosis not present

## 2016-05-06 NOTE — Progress Notes (Signed)
   Department of Radiation Oncology  Phone:  [phone] Fax:        [phone]  Weekly Treatment Note    Name: [name] Date: 05/07/2016 MRN: [id number] DOB: April 22, 1941   Diagnosis:     ICD-9-CM ICD-10-CM   1. Malignant neoplasm of upper-outer quadrant of left breast in female, estrogen receptor positive ([place]) 174.4 C50.412    V86.0 Z17.0      Current dose: 30 Gy  Current fraction:12   MEDICATIONS: Current Outpatient Prescriptions  Medication Sig Dispense Refill  . acetaminophen (TYLENOL) 500 MG tablet Take 500 mg by mouth daily as needed for headache.    Marland Kitchen amLODipine (NORVASC) 5 MG tablet Take 5 mg by mouth daily.    Marland Kitchen aspirin 81 MG tablet Take 81 mg by mouth daily.    Marland Kitchen aspirin EC 81 MG tablet Take 81 mg by mouth daily.    . Cholecalciferol (VITAMIN D3) 2000 units capsule Take 2,000 Units by mouth daily.    . furosemide (LASIX) 40 MG tablet Take 40 mg by mouth as needed. Swelling    . hyaluronate sodium (RADIAPLEXRX) GEL Apply 1 application topically 2 (two) times daily.    . Multiple Vitamin (MULTIVITAMIN WITH MINERALS) TABS tablet Take 1 tablet by mouth daily.    . non-metallic deodorant Jethro Poling) MISC Apply 1 application topically daily as needed.     No current facility-administered medications for this encounter.      ALLERGIES: Hydrocodone-acetaminophen   LABORATORY DATA:  Lab Results  Component Value Date   WBC 6.4 02/20/2016   HGB 13.2 02/20/2016   HCT 41.1 02/20/2016   MCV 88.0 02/20/2016   PLT 356 02/20/2016   Lab Results  Component Value Date   NA 139 02/20/2016   K 3.8 02/20/2016   CL 105 02/20/2016   CO2 27 02/20/2016   Lab Results  Component Value Date   ALT 18 10/02/2014   AST 28 10/02/2014   ALKPHOS 58 10/02/2014   BILITOT 0.7 10/02/2014     NARRATIVE: [name] was seen today for weekly treatment management. The chart was checked and the patient's films were reviewed.  Weekly radiation treatment to the left breast 12/20  completed. Nursing notes, mild erythema on breast with dryness around the nipple with more angry looking under axilla and skin intact. She is using radiaplex bid. She reports occasionally the treatment area will tingle and she feels the urge to scratch it. She denies pain. Appetite is good. In the afternoon around 3 to 4 pm she rests for a couple hours to reduce fatigue.   PHYSICAL EXAMINATION: weight is 206 lb 12.8 oz (93.8 kg). Her oral temperature is 97.7 F (36.5 C). Her blood pressure is 146/79 (abnormal) and her pulse is 79.      Diffuse hyperpigmentation without any desquamation.  ASSESSMENT: The patient is doing satisfactorily with treatment.  PLAN: We will continue with the patient's radiation treatment as planned.     ------------------------------------------------  Jodelle Gross, MD, PhD  This document serves as a record of services personally performed by Kyung Rudd, MD. It was created on his behalf by Arlyce Harman, a trained medical scribe. The creation of this record is based on the scribe's personal observations and the provider's statements to them. This document has been checked and approved by the attending provider.

## 2016-05-06 NOTE — Progress Notes (Signed)
weekly rad txs left breast 12/20 completed, mild erythema  On breast, dryness arounf nipple,  more angry looking under axilla, skin intact, using radiaplex bid,  No pain, Appetite good, ,   Afternoon around 3-4pm rests for a couple hours  BP (!) 146/79 (BP Location: Right Arm, Patient Position: Sitting, Cuff Size: Large)   Pulse 79   Temp 97.7 F (36.5 C) (Oral)   Wt 206 lb 12.8 oz (93.8 kg)   BMI 31.44 kg/m   Wt Readings from Last 3 Encounters:  05/06/16 206 lb 12.8 oz (93.8 kg)  05/01/16 205 lb 6.4 oz (93.2 kg)  04/24/16 207 lb 6.4 oz (94.1 kg)

## 2016-05-07 ENCOUNTER — Ambulatory Visit
Admission: RE | Admit: 2016-05-07 | Discharge: 2016-05-07 | Disposition: A | Discharge status: Home / Self Care | Payer: Medicare Other | Source: Ambulatory Visit | Attending: Radiation Oncology | Admitting: Radiation Oncology

## 2016-05-07 DIAGNOSIS — Z510 Encounter for antineoplastic radiation therapy: Secondary | ICD-10-CM | POA: Diagnosis not present

## 2016-05-08 ENCOUNTER — Ambulatory Visit
Admission: RE | Admit: 2016-05-08 | Discharge: 2016-05-08 | Disposition: A | Discharge status: Home / Self Care | Payer: Medicare Other | Source: Ambulatory Visit | Attending: Radiation Oncology | Admitting: Radiation Oncology

## 2016-05-08 ENCOUNTER — Ambulatory Visit: Payer: Medicare Other | Admitting: Radiation Oncology

## 2016-05-08 DIAGNOSIS — Z510 Encounter for antineoplastic radiation therapy: Secondary | ICD-10-CM | POA: Diagnosis not present

## 2016-05-11 ENCOUNTER — Ambulatory Visit
Admission: RE | Admit: 2016-05-11 | Discharge: 2016-05-11 | Disposition: A | Discharge status: Home / Self Care | Payer: Medicare Other | Source: Ambulatory Visit | Attending: Radiation Oncology | Admitting: Radiation Oncology

## 2016-05-11 DIAGNOSIS — Z510 Encounter for antineoplastic radiation therapy: Secondary | ICD-10-CM | POA: Diagnosis not present

## 2016-05-12 ENCOUNTER — Ambulatory Visit
Admission: RE | Admit: 2016-05-12 | Discharge: 2016-05-12 | Disposition: A | Discharge status: Home / Self Care | Payer: Medicare Other | Source: Ambulatory Visit | Attending: Radiation Oncology | Admitting: Radiation Oncology

## 2016-05-12 DIAGNOSIS — Z510 Encounter for antineoplastic radiation therapy: Secondary | ICD-10-CM | POA: Diagnosis not present

## 2016-05-13 ENCOUNTER — Ambulatory Visit
Admission: RE | Admit: 2016-05-13 | Discharge: 2016-05-13 | Disposition: A | Discharge status: Home / Self Care | Payer: Medicare Other | Source: Ambulatory Visit | Attending: Radiation Oncology | Admitting: Radiation Oncology

## 2016-05-13 ENCOUNTER — Encounter: Payer: Self-pay | Admitting: Radiation Oncology

## 2016-05-13 ENCOUNTER — Ambulatory Visit: Payer: Medicare Other

## 2016-05-13 VITALS — BP 165/96 | HR 74 | Temp 97.9°F | Resp 16 | Wt 206.0 lb

## 2016-05-13 DIAGNOSIS — Z170 Estrogen receptor positive status [ER+]: Principal | ICD-10-CM

## 2016-05-13 DIAGNOSIS — Z510 Encounter for antineoplastic radiation therapy: Secondary | ICD-10-CM | POA: Diagnosis not present

## 2016-05-13 DIAGNOSIS — C50412 Malignant neoplasm of upper-outer quadrant of left female breast: Secondary | ICD-10-CM

## 2016-05-13 NOTE — Progress Notes (Signed)
Weekly rad yx left breast , mild tanning, under axilla more, soreness there, ski intact, radiaplex bid, apapetite good, no pain 10:09 AM BP (!) 165/96 (BP Location: Right Arm, Patient Position: Sitting, Cuff Size: Normal)   Pulse 74   Temp 97.9 F (36.6 C) (Oral)   Resp 16   Wt 206 lb (93.4 kg)   BMI 31.32 kg/m   Wt Readings from Last 3 Encounters:  05/13/16 206 lb (93.4 kg)  05/06/16 206 lb 12.8 oz (93.8 kg)  05/01/16 205 lb 6.4 oz (93.2 kg)

## 2016-05-13 NOTE — Progress Notes (Signed)
Complex simulation note  Diagnosis: Left-sided breast cancer  Narrative The patient has initially been planned to receive a course of whole breast radiation to a dose of 42.5 Gy in 17 fractions. The patient will now receive an additional boost to the seroma cavity which has been contoured. This will correspond to a boost of 7.5 Gy at 2.5 Gy per fraction. To accomplish this, an additional 3 customized blocks have been designed for this purpose. A complex isodose plan is requested to ensure that the target area is adequately covered with radiation dose and that the nearby normal structures such as the lung are adequately spared. The patient's final total dose will be 50 Gy.  ------------------------------------------------  Amanda Desmith S. Aydee Mcnew, MD, PhD 

## 2016-05-13 NOTE — Progress Notes (Addendum)
   Department of Radiation Oncology  Phone:  [phone] Fax:        [phone]  Weekly Treatment Note    Name: [name] Date: 05/13/2016 MRN: [id number] DOB: 1940/10/12   Diagnosis:     ICD-9-CM ICD-10-CM   1. Malignant neoplasm of upper-outer quadrant of left breast in female, estrogen receptor positive ([place]) 174.4 C50.412    V86.0 Z17.0      Current dose: 42.5 Gy  Current fraction:17   MEDICATIONS: Current Outpatient Prescriptions  Medication Sig Dispense Refill  . acetaminophen (TYLENOL) 500 MG tablet Take 500 mg by mouth daily as needed for headache.    Marland Kitchen amLODipine (NORVASC) 5 MG tablet Take 5 mg by mouth daily.    Marland Kitchen aspirin 81 MG tablet Take 81 mg by mouth daily.    Marland Kitchen aspirin EC 81 MG tablet Take 81 mg by mouth daily.    . Cholecalciferol (VITAMIN D3) 2000 units capsule Take 2,000 Units by mouth daily.    . furosemide (LASIX) 40 MG tablet Take 40 mg by mouth as needed. Swelling    . hyaluronate sodium (RADIAPLEXRX) GEL Apply 1 application topically 2 (two) times daily.    . Multiple Vitamin (MULTIVITAMIN WITH MINERALS) TABS tablet Take 1 tablet by mouth daily.    . non-metallic deodorant Jethro Poling) MISC Apply 1 application topically daily as needed.     No current facility-administered medications for this encounter.      ALLERGIES: Hydrocodone-acetaminophen   LABORATORY DATA:  Lab Results  Component Value Date   WBC 6.4 02/20/2016   HGB 13.2 02/20/2016   HCT 41.1 02/20/2016   MCV 88.0 02/20/2016   PLT 356 02/20/2016   Lab Results  Component Value Date   NA 139 02/20/2016   K 3.8 02/20/2016   CL 105 02/20/2016   CO2 27 02/20/2016   Lab Results  Component Value Date   ALT 18 10/02/2014   AST 28 10/02/2014   ALKPHOS 58 10/02/2014   BILITOT 0.7 10/02/2014     NARRATIVE: [name] was seen today for weekly treatment management. The chart was checked and the patient's films were reviewed.  Patient reports some soreness under the left  axilla. Her appetite is good. She denies pain. She questions whether she can use her own deodorant or if she needs to continue using the alra.  PHYSICAL EXAMINATION: weight is 206 lb (93.4 kg). Her oral temperature is 97.9 F (36.6 C). Her blood pressure is 165/96 (abnormal) and her pulse is 74. Her respiration is 16.      Diffuse hyperpigmentation under left axilla without any desquamation.  ASSESSMENT: The patient is doing satisfactorily with treatment.  PLAN: We will continue with the patient's radiation treatment as planned. We discussed using Vitamin E oil in addition to the radiaplex. She may use her own deodorant if desired.    ------------------------------------------------  Jodelle Gross, MD, PhD  This document serves as a record of services personally performed by Kyung Rudd, MD and Shona Simpson, PA. It was created on his behalf by Maryla Morrow, a trained medical scribe. The creation of this record is based on the scribe's personal observations and the provider's statements to them. This document has been checked and approved by the attending provider.

## 2016-05-14 ENCOUNTER — Ambulatory Visit
Admission: RE | Admit: 2016-05-14 | Discharge: 2016-05-14 | Disposition: A | Discharge status: Home / Self Care | Payer: Medicare Other | Source: Ambulatory Visit | Attending: Radiation Oncology | Admitting: Radiation Oncology

## 2016-05-14 DIAGNOSIS — Z510 Encounter for antineoplastic radiation therapy: Secondary | ICD-10-CM | POA: Diagnosis not present

## 2016-05-15 ENCOUNTER — Ambulatory Visit
Admission: RE | Admit: 2016-05-15 | Discharge: 2016-05-15 | Disposition: A | Discharge status: Home / Self Care | Payer: Medicare Other | Source: Ambulatory Visit | Attending: Radiation Oncology | Admitting: Radiation Oncology

## 2016-05-15 DIAGNOSIS — Z510 Encounter for antineoplastic radiation therapy: Secondary | ICD-10-CM | POA: Diagnosis not present

## 2016-05-17 NOTE — Assessment & Plan Note (Signed)
Left lumpectomy 02/27/2016: IDC with papillary features, grade 2, 1.1 cm, ADH, superior/medial margin positive, ER 95%, PR 95%, Ki-67 5%, HER-2 negative ratio 1.13,  Margin re-excision 03/12/2016: Negative for cancer, 0/2 lymph nodes negative Pathologic staging: T1c N0 stage IA Oncotype Dx score 0: 2% ROR Adj XRT 04/21/16 to 05/15/16  Treatment Plan: Adjuvant therapy with Anastrozole 1 mg daily X 5 years RTC in 3 months for Tox check

## 2016-05-18 ENCOUNTER — Ambulatory Visit
Admission: RE | Admit: 2016-05-18 | Discharge: 2016-05-18 | Disposition: A | Discharge status: Home / Self Care | Payer: Medicare Other | Source: Ambulatory Visit | Attending: Radiation Oncology | Admitting: Radiation Oncology

## 2016-05-18 ENCOUNTER — Encounter: Payer: Self-pay | Admitting: Hematology and Oncology

## 2016-05-18 ENCOUNTER — Ambulatory Visit (HOSPITAL_BASED_OUTPATIENT_CLINIC_OR_DEPARTMENT_OTHER): Payer: Medicare Other | Admitting: Hematology and Oncology

## 2016-05-18 ENCOUNTER — Ambulatory Visit: Payer: Medicare Other

## 2016-05-18 ENCOUNTER — Telehealth: Payer: Self-pay | Admitting: Hematology and Oncology

## 2016-05-18 ENCOUNTER — Telehealth: Payer: Self-pay | Admitting: *Deleted

## 2016-05-18 ENCOUNTER — Encounter: Payer: Self-pay | Admitting: Radiation Oncology

## 2016-05-18 DIAGNOSIS — Z170 Estrogen receptor positive status [ER+]: Secondary | ICD-10-CM

## 2016-05-18 DIAGNOSIS — Z79811 Long term (current) use of aromatase inhibitors: Secondary | ICD-10-CM

## 2016-05-18 DIAGNOSIS — Z510 Encounter for antineoplastic radiation therapy: Secondary | ICD-10-CM | POA: Diagnosis not present

## 2016-05-18 DIAGNOSIS — C50412 Malignant neoplasm of upper-outer quadrant of left female breast: Secondary | ICD-10-CM | POA: Diagnosis not present

## 2016-05-18 NOTE — Telephone Encounter (Signed)
Gave patient avs report and appointments for April  °

## 2016-05-18 NOTE — Telephone Encounter (Signed)
  Oncology Nurse Navigator Documentation  Navigator Location: [facility] (05/18/16 1100)   )Navigator Encounter Type: Telephone (05/18/16 1100) Telephone: [place] Call (05/18/16 1100)                   Patient Visit Type: [id number] (05/18/16 1100) Treatment Phase: Final Radiation Tx (05/18/16 1100)                            Time Spent with Patient: 15 (05/18/16 1100)

## 2016-05-18 NOTE — Progress Notes (Signed)
Patient Care Team: Leighton Ruff, MD as PCP - General (Family Medicine)  DIAGNOSIS:  Encounter Diagnosis  Name Primary?  . Malignant neoplasm of upper-outer quadrant of left breast in female, estrogen receptor positive ([place])     SUMMARY OF ONCOLOGIC HISTORY:   Breast cancer of upper-outer quadrant of left female breast ([place])   01/02/2016 Initial Biopsy    Left breast biopsy 1:00: Ductal papilloma with [facility]      02/27/2016 Surgery    Left lumpectomy: IDC with papillary features, grade 2, 1.1 cm, ADH, superior/medial margin positive, ER 95%, PR 95%, Ki-67 5%, HER-2 negative ratio 1.13, T1c N0 stage IA      03/12/2016 Surgery    Left superior and medial margin reexcision: Negative for malignancy, 0/2 lymph nodes; Oncotype Dx 0 (2% ROR)       04/21/2016 - 05/15/2016 Radiation Therapy    Adj XRT       CHIEF COMPLIANT: Follow-up after adjuvant radiation  INTERVAL HISTORY: [name] is a 75 year old with above-mentioned history of left breast cancer treated with lumpectomy and she just completed adjuvant radiation therapy. She is here today to discuss the risks and benefits of adjuvant antiestrogen therapy. Overall she is tolerated radiation fairly well except for radiation dermatitis and fatigue.  REVIEW OF SYSTEMS:   Constitutional: Denies fevers, chills or abnormal weight loss Eyes: Denies blurriness of vision Ears, nose, mouth, throat, and face: Denies mucositis or sore throat Respiratory: Denies cough, dyspnea or wheezes Cardiovascular: Denies palpitation, chest discomfort Gastrointestinal:  Denies nausea, heartburn or change in bowel habits Skin: Denies abnormal skin rashes Lymphatics: Denies new lymphadenopathy or easy bruising Neurological:Denies numbness, tingling or new weaknesses Behavioral/Psych: Mood is stable, no new changes  Extremities: No lower extremity edema Breast: Radiation dermatitis All other systems were reviewed with the patient and are  negative.  I have reviewed the past medical history, past surgical history, social history and family history with the patient and they are unchanged from previous note.  ALLERGIES:  is allergic to hydrocodone-acetaminophen.  MEDICATIONS:  Current Outpatient Prescriptions  Medication Sig Dispense Refill  . acetaminophen (TYLENOL) 500 MG tablet Take 500 mg by mouth daily as needed for headache.    Marland Kitchen amLODipine (NORVASC) 5 MG tablet Take 5 mg by mouth daily.    Marland Kitchen aspirin 81 MG tablet Take 81 mg by mouth daily.    Marland Kitchen aspirin EC 81 MG tablet Take 81 mg by mouth daily.    . Cholecalciferol (VITAMIN D3) 2000 units capsule Take 2,000 Units by mouth daily.    . furosemide (LASIX) 40 MG tablet Take 40 mg by mouth as needed. Swelling    . hyaluronate sodium (RADIAPLEXRX) GEL Apply 1 application topically 2 (two) times daily.    . Multiple Vitamin (MULTIVITAMIN WITH MINERALS) TABS tablet Take 1 tablet by mouth daily.    . non-metallic deodorant Jethro Poling) MISC Apply 1 application topically daily as needed.     No current facility-administered medications for this visit.     PHYSICAL EXAMINATION: ECOG PERFORMANCE STATUS: 1 - Symptomatic but completely ambulatory  Vitals:   05/18/16 1001  BP: (!) 169/92  Pulse: 71  Resp: 18  Temp: 97.7 F (36.5 C)   Filed Weights   05/18/16 1001  Weight: 206 lb 11.2 oz (93.8 kg)    GENERAL:alert, no distress and comfortable SKIN: skin color, texture, turgor are normal, no rashes or significant lesions EYES: normal, Conjunctiva are pink and non-injected, sclera clear OROPHARYNX:no exudate, no erythema and lips,  buccal mucosa, and tongue normal  NECK: supple, thyroid normal size, non-tender, without nodularity LYMPH:  no palpable lymphadenopathy in the cervical, axillary or inguinal LUNGS: clear to auscultation and percussion with normal breathing effort HEART: regular rate & rhythm and no murmurs and no lower extremity edema ABDOMEN:abdomen soft,  non-tender and normal bowel sounds MUSCULOSKELETAL:no cyanosis of digits and no clubbing  NEURO: alert & oriented x 3 with fluent speech, no focal motor/sensory deficits EXTREMITIES: No lower extremity edema  LABORATORY DATA:  I have reviewed the data as listed   Chemistry      Component Value Date/Time   NA 139 02/20/2016 0837   K 3.8 02/20/2016 0837   CL 105 02/20/2016 0837   CO2 27 02/20/2016 0837   BUN 17 02/20/2016 0837   CREATININE 1.41 (H) 02/20/2016 0837      Component Value Date/Time   CALCIUM 9.1 02/20/2016 0837   ALKPHOS 58 10/02/2014 1032   AST 28 10/02/2014 1032   ALT 18 10/02/2014 1032   BILITOT 0.7 10/02/2014 1032       Lab Results  Component Value Date   WBC 6.4 02/20/2016   HGB 13.2 02/20/2016   HCT 41.1 02/20/2016   MCV 88.0 02/20/2016   PLT 356 02/20/2016   NEUTROABS 8.9 (H) 10/02/2014   ASSESSMENT & PLAN:  Breast cancer of upper-outer quadrant of left female breast ([place]) Left lumpectomy 02/27/2016: IDC with papillary features, grade 2, 1.1 cm, ADH, superior/medial margin positive, ER 95%, PR 95%, Ki-67 5%, HER-2 negative ratio 1.13,  Margin re-excision 03/12/2016: Negative for cancer, 0/2 lymph nodes negative Pathologic staging: T1c N0 stage IA Oncotype Dx score 0: 2% ROR Adj XRT 04/21/16 to 05/15/16  Treatment Plan: Adjuvant therapy with Anastrozole 1 mg daily X 5 years To be started 06/29/2016 Anastrozole counseling: We discussed the risks and benefits of anti-estrogen therapy with aromatase inhibitors. These include but not limited to insomnia, hot flashes, mood changes, vaginal dryness, bone density loss, and weight gain. We strongly believe that the benefits far outweigh the risks. Patient understands these risks and consented to starting treatment. Planned treatment duration is 5 years.  RTC in April 2018 for Tox check  No orders of the defined types were placed in this encounter.  The patient has a good understanding of the overall plan.  she agrees with it. she will call with any problems that may develop before the next visit here.   Rulon Eisenmenger, MD 05/18/16

## 2016-05-19 ENCOUNTER — Ambulatory Visit: Payer: Medicare Other

## 2016-06-06 NOTE — Progress Notes (Signed)
  Radiation Oncology         [phone]) [phone] ________________________________  Name: [name] MRN: [id number]  Date: 05/18/2016  DOB: 1941/05/24  End of Treatment Note  Diagnosis:   Left-sided breast cancer     Indication for treatment:  Curative       Radiation treatment dates:   04/21/2016 through 05/18/2016  Site/dose:   The patient initially received a dose of 42.5 Gy in 17 fractions to the breast using whole-breast tangent fields. This was delivered using a 3-D conformal technique. The patient then received a boost to the seroma. This delivered an additional 7.5 Gy in 3 fractions using a 3 field photon technique due to the depth of the seroma. The total dose was 50 Gy.  Narrative: The patient tolerated radiation treatment relatively well.   The patient had some expected skin irritation as she progressed during treatment. Moist desquamation was not present at the end of treatment.  Plan: The patient has completed radiation treatment. The patient will return to radiation oncology clinic for routine followup in one month. I advised the patient to call or return sooner if they have any questions or concerns related to their recovery or treatment. ________________________________  Jodelle Gross, M.D., Ph.D.

## 2016-06-24 ENCOUNTER — Encounter: Payer: Self-pay | Admitting: Hematology and Oncology

## 2016-06-25 ENCOUNTER — Other Ambulatory Visit: Payer: Self-pay | Admitting: Emergency Medicine

## 2016-06-25 ENCOUNTER — Other Ambulatory Visit: Payer: Self-pay | Admitting: *Deleted

## 2016-06-25 MED ORDER — ANASTROZOLE 1 MG PO TABS: 1.0000 mg | ORAL_TABLET | Freq: Every day | ORAL | 3 refills | Status: DC

## 2016-06-26 NOTE — Progress Notes (Addendum)
   [name] 75 year old woman is here for a one month follow up appointment for  Malignant neoplasm of upper-outer quadrant of left breast in female, estrogen receptor positive     Skin status:Left breast and axilla with mild hyperpigmentation. What lotion are you using? Used Radiaplex gel and will start using a lotion with vitamin E.  Left breast with some swelling outer area. Will have a follow up with Dr. Marlou Starks July 14, 2016 Have you seen med onc? If not, when is appointment: 05-18-16 Dr. [name] next October 05, 2016 If they are ER+, have they started Al or Tamoxifen? If not, why? 06-29-16 Anastrozole Discuss survivorship appointment. Not scheduled yet Have you had a mammogram scheduled? Not scheduled yet Offer referral to [facility]. Will receive at the survivorship appointment. Appetite:Good Pain:None Fatigue:Having mild fatigue in the afternoon. Arm mobility:Having some stiffness at time when she raises her left arm. Lymphedema: None Wt Readings from Last 3 Encounters:  07/07/16 209 lb 12.8 oz (95.2 kg)  05/18/16 206 lb 11.2 oz (93.8 kg)  05/13/16 206 lb (93.4 kg)    Oncotype Dx score 0: 2% ROR  BP (!) 158/94   Pulse 70   Temp 98.2 F (36.8 C) (Oral)   Resp 18   Ht 5\' 8"  (1.727 m)   Wt 209 lb 12.8 oz (95.2 kg)   BMI 31.90 kg/m

## 2016-07-01 ENCOUNTER — Other Ambulatory Visit: Payer: Self-pay | Admitting: Emergency Medicine

## 2016-07-01 DIAGNOSIS — C50412 Malignant neoplasm of upper-outer quadrant of left female breast: Secondary | ICD-10-CM

## 2016-07-01 DIAGNOSIS — E2839 Other primary ovarian failure: Secondary | ICD-10-CM

## 2016-07-01 DIAGNOSIS — Z170 Estrogen receptor positive status [ER+]: Principal | ICD-10-CM

## 2016-07-07 ENCOUNTER — Encounter: Payer: Self-pay | Admitting: Radiation Oncology

## 2016-07-07 ENCOUNTER — Ambulatory Visit
Admission: RE | Admit: 2016-07-07 | Discharge: 2016-07-07 | Disposition: A | Discharge status: Home / Self Care | Payer: Medicare Other | Source: Ambulatory Visit | Attending: Radiation Oncology | Admitting: Radiation Oncology

## 2016-07-07 VITALS — BP 158/94 | HR 70 | Temp 98.2°F | Resp 18 | Ht 68.0 in | Wt 209.8 lb

## 2016-07-07 DIAGNOSIS — Z7982 Long term (current) use of aspirin: Secondary | ICD-10-CM | POA: Insufficient documentation

## 2016-07-07 DIAGNOSIS — Z79899 Other long term (current) drug therapy: Secondary | ICD-10-CM | POA: Insufficient documentation

## 2016-07-07 DIAGNOSIS — Z923 Personal history of irradiation: Secondary | ICD-10-CM | POA: Insufficient documentation

## 2016-07-07 DIAGNOSIS — C50912 Malignant neoplasm of unspecified site of left female breast: Secondary | ICD-10-CM | POA: Insufficient documentation

## 2016-07-07 DIAGNOSIS — Z170 Estrogen receptor positive status [ER+]: Secondary | ICD-10-CM | POA: Insufficient documentation

## 2016-07-07 DIAGNOSIS — Z888 Allergy status to other drugs, medicaments and biological substances status: Secondary | ICD-10-CM | POA: Diagnosis not present

## 2016-07-07 DIAGNOSIS — C50412 Malignant neoplasm of upper-outer quadrant of left female breast: Secondary | ICD-10-CM

## 2016-07-07 NOTE — Addendum Note (Signed)
Encounter addended by: Malena Edman, RN on: 07/07/2016 10:57 AM<BR>    Actions taken: Charge Capture section accepted

## 2016-07-07 NOTE — Progress Notes (Signed)
Radiation Oncology         [phone] ________________________________  Name: [name] MRN: [id number]  Date: 07/07/2016  DOB: [birth year]  Post Treatment Note  CC: [name], MD  [name], MD  Diagnosis:  Stage IA, T1cN0 ER/PR positive invasive ductal carcinoma with papillary features of the left breast.  Interval Since Last Radiation:  7 weeks   04/21/2016 through 05/18/2016: The patient initially received a dose of 42.5 Gy in 17 fractions to the breast using whole-breast tangent fields. This was delivered using a 3-D conformal technique. The patient then received a boost to the seroma. This delivered an additional 7.5 Gy in 3 fractions using a 3 field photon technique due to the depth of the seroma. The total dose was 50 Gy.  Narrative:  The patient returns today for routine follow-up. During treatment she did very well with radiotherapy and did not have significant desquamation. She began anastrazole at the beginning of the month and will see Dr. [name] on 10/05/16 for follow up.                            On review of systems, the patient states she's doing great. She denies any concerns with her skin. She is currently using radiaplex and will finish this up and has plans to switch to vitamin E. She denies any hot flashes, night sweats, or joint aches/pains. She has had occasional twinges of pain in the lateral part of the breast, particularly when wearing an underwire bra. No other complaints are noted.  ALLERGIES:  is allergic to hydrocodone-acetaminophen.  Meds: Current Outpatient Prescriptions  Medication Sig Dispense Refill  . amLODipine (NORVASC) 5 MG tablet Take 5 mg by mouth daily.    Marland Kitchen anastrozole (ARIMIDEX) 1 MG tablet Take 1 tablet (1 mg total) by mouth daily. 90 tablet 3  . aspirin 81 MG tablet Take 81 mg by mouth daily.    Marland Kitchen aspirin EC 81 MG tablet Take 81 mg by mouth daily.    . Cholecalciferol (VITAMIN D3) 2000 units capsule Take 2,000 Units by  mouth daily.    . Multiple Vitamin (MULTIVITAMIN WITH MINERALS) TABS tablet Take 1 tablet by mouth daily.    Marland Kitchen acetaminophen (TYLENOL) 500 MG tablet Take 500 mg by mouth daily as needed for headache.    . furosemide (LASIX) 40 MG tablet Take 40 mg by mouth as needed. Swelling     No current facility-administered medications for this encounter.     Physical Findings:  height is 5\' 8"  (1.727 m) and weight is 209 lb 12.8 oz (95.2 kg). Her oral temperature is 98.2 F (36.8 C). Her blood pressure is 158/94 (abnormal) and her pulse is 70. Her respiration is 18.  In general this is a well appearing African American female in no acute distress. She's alert and oriented x4 and appropriate throughout the examination. Cardiopulmonary assessment is negative for acute distress and she exhibits normal effort. The left breast was examined and reveals mild hyperpigmentation of the breast and chest wall without desquamation or break down.   Lab Findings: Lab Results  Component Value Date   WBC 6.4 02/20/2016   HGB 13.2 02/20/2016   HCT 41.1 02/20/2016   MCV 88.0 02/20/2016   PLT 356 02/20/2016     Radiographic Findings: No results found.  Impression/Plan: 1. Stage IA, T1cN0 ER/PR positive invasive ductal carcinoma with papillary features of the left breast. The patient has  been doing well since completion of radiotherapy. We discussed that we would be happy to continue to follow her as needed, but she will also continue to follow up with Dr. [name] in medical oncology. She was counseled on skin care and will keep [place] informed of questions or concerns regarding this or her previous treatment.  2. Survivorship. The patient will be referred by her navigator to the survivorship clinic when appropriate.     Carola Rhine, PAC

## 2016-07-13 ENCOUNTER — Ambulatory Visit
Admission: RE | Admit: 2016-07-13 | Discharge: 2016-07-13 | Disposition: A | Discharge status: Home / Self Care | Payer: Medicare Other | Source: Ambulatory Visit | Attending: Hematology and Oncology | Admitting: Hematology and Oncology

## 2016-07-13 DIAGNOSIS — E2839 Other primary ovarian failure: Secondary | ICD-10-CM

## 2016-07-13 DIAGNOSIS — Z170 Estrogen receptor positive status [ER+]: Principal | ICD-10-CM

## 2016-07-13 DIAGNOSIS — C50412 Malignant neoplasm of upper-outer quadrant of left female breast: Secondary | ICD-10-CM

## 2016-07-22 ENCOUNTER — Telehealth: Payer: Self-pay | Admitting: Medical Oncology

## 2016-07-22 NOTE — Telephone Encounter (Signed)
I returned call and left message because this number was on nursing line but no message left

## 2016-10-05 ENCOUNTER — Encounter: Payer: Self-pay | Admitting: Hematology and Oncology

## 2016-10-05 ENCOUNTER — Ambulatory Visit (HOSPITAL_BASED_OUTPATIENT_CLINIC_OR_DEPARTMENT_OTHER): Payer: Medicare Other | Admitting: Hematology and Oncology

## 2016-10-05 DIAGNOSIS — C50412 Malignant neoplasm of upper-outer quadrant of left female breast: Secondary | ICD-10-CM | POA: Diagnosis not present

## 2016-10-05 DIAGNOSIS — Z170 Estrogen receptor positive status [ER+]: Secondary | ICD-10-CM

## 2016-10-05 DIAGNOSIS — N951 Menopausal and female climacteric states: Secondary | ICD-10-CM | POA: Diagnosis not present

## 2016-10-05 DIAGNOSIS — M2560 Stiffness of unspecified joint, not elsewhere classified: Secondary | ICD-10-CM

## 2016-10-05 NOTE — Assessment & Plan Note (Signed)
Left lumpectomy08/31/2017:IDC with papillary features, grade 2, 1.1 cm, ADH, superior/medial margin positive, ER 95%, PR 95%, Ki-67 5%, HER-2 negative ratio 1.13,  Margin re-excision 03/12/2016: Negative for cancer, 0/2 lymph nodes negative Pathologic staging: T1c N0 stage IA Oncotype Dx score 0: 2% ROR Adj XRT 04/21/16 to 05/15/16  Treatment Plan: Adjuvant therapy with Anastrozole 1 mg daily X 5 years started 06/29/2016 Anastrozole toxicities:  Return to clinic in 6 months for follow-up

## 2016-10-05 NOTE — Progress Notes (Signed)
Patient Care Team: Leighton Ruff, MD as PCP - General (Family Medicine)  DIAGNOSIS:  Encounter Diagnosis  Name Primary?  . Malignant neoplasm of upper-outer quadrant of left breast in female, estrogen receptor positive ([place])     SUMMARY OF ONCOLOGIC HISTORY:   Breast cancer of upper-outer quadrant of left female breast ([place])   01/02/2016 Initial Biopsy    Left breast biopsy 1:00: Ductal papilloma with [facility]      02/27/2016 Surgery    Left lumpectomy: IDC with papillary features, grade 2, 1.1 cm, ADH, superior/medial margin positive, ER 95%, PR 95%, Ki-67 5%, HER-2 negative ratio 1.13, T1c N0 stage IA      03/12/2016 Surgery    Left superior and medial margin reexcision: Negative for malignancy, 0/2 lymph nodes; Oncotype Dx 0 (2% ROR)       04/21/2016 - 05/15/2016 Radiation Therapy    Adj XRT      06/29/2016 -  Anti-estrogen oral therapy    Anastrozole 1 mg daily       CHIEF COMPLIANT: Follow-up of anastrozole therapy  INTERVAL HISTORY: Graceland Wachter is a 76 year old with above-mentioned history left breast cancer treated with lumpectomy and radiation and is currently on anastrozole therapy started in January 2018. She does complain of hot flashes that wake her up at night. She also complains of bilateral knee pain especially in the morning when she goes down the stairs. It does not seem to be bothering her once she gets moving.  REVIEW OF SYSTEMS:   Constitutional: Denies fevers, chills or abnormal weight loss Eyes: Denies blurriness of vision Ears, nose, mouth, throat, and face: Denies mucositis or sore throat Respiratory: Denies cough, dyspnea or wheezes Cardiovascular: Denies palpitation, chest discomfort Gastrointestinal:  Denies nausea, heartburn or change in bowel habits Skin: Denies abnormal skin rashes Lymphatics: Denies new lymphadenopathy or easy bruising Neurological:Denies numbness, tingling or new weaknesses Behavioral/Psych: Mood is stable, no new changes    Extremities: No lower extremity edema Breast:  denies any pain or lumps or nodules in either breasts All other systems were reviewed with the patient and are negative.  I have reviewed the past medical history, past surgical history, social history and family history with the patient and they are unchanged from previous note.  ALLERGIES:  is allergic to hydrocodone-acetaminophen.  MEDICATIONS:  Current Outpatient Prescriptions  Medication Sig Dispense Refill  . acetaminophen (TYLENOL) 500 MG tablet Take 500 mg by mouth daily as needed for headache.    Marland Kitchen amLODipine (NORVASC) 5 MG tablet Take 5 mg by mouth daily.    Marland Kitchen anastrozole (ARIMIDEX) 1 MG tablet Take 1 tablet (1 mg total) by mouth daily. 90 tablet 3  . aspirin 81 MG tablet Take 81 mg by mouth daily.    Marland Kitchen aspirin EC 81 MG tablet Take 81 mg by mouth daily.    . Cholecalciferol (VITAMIN D3) 2000 units capsule Take 2,000 Units by mouth daily.    . furosemide (LASIX) 40 MG tablet Take 40 mg by mouth as needed. Swelling    . Multiple Vitamin (MULTIVITAMIN WITH MINERALS) TABS tablet Take 1 tablet by mouth daily.     No current facility-administered medications for this visit.     PHYSICAL EXAMINATION: ECOG PERFORMANCE STATUS: 1 - Symptomatic but completely ambulatory  Vitals:   10/05/16 0842  BP: (!) 167/82  Pulse: 78  Resp: 18  Temp: 98.1 F (36.7 C)   Filed Weights   10/05/16 0842  Weight: 207 lb 4.8 oz (94 kg)  GENERAL:alert, no distress and comfortable SKIN: skin color, texture, turgor are normal, no rashes or significant lesions EYES: normal, Conjunctiva are pink and non-injected, sclera clear OROPHARYNX:no exudate, no erythema and lips, buccal mucosa, and tongue normal  NECK: supple, thyroid normal size, non-tender, without nodularity LYMPH:  no palpable lymphadenopathy in the cervical, axillary or inguinal LUNGS: clear to auscultation and percussion with normal breathing effort HEART: regular rate & rhythm and  no murmurs and no lower extremity edema ABDOMEN:abdomen soft, non-tender and normal bowel sounds MUSCULOSKELETAL:no cyanosis of digits and no clubbing  NEURO: alert & oriented x 3 with fluent speech, no focal motor/sensory deficits EXTREMITIES: No lower extremity edema  LABORATORY DATA:  I have reviewed the data as listed   Chemistry      Component Value Date/Time   NA 139 02/20/2016 0837   K 3.8 02/20/2016 0837   CL 105 02/20/2016 0837   CO2 27 02/20/2016 0837   BUN 17 02/20/2016 0837   CREATININE 1.41 (H) 02/20/2016 0837      Component Value Date/Time   CALCIUM 9.1 02/20/2016 0837   ALKPHOS 58 10/02/2014 1032   AST 28 10/02/2014 1032   ALT 18 10/02/2014 1032   BILITOT 0.7 10/02/2014 1032       Lab Results  Component Value Date   WBC 6.4 02/20/2016   HGB 13.2 02/20/2016   HCT 41.1 02/20/2016   MCV 88.0 02/20/2016   PLT 356 02/20/2016   NEUTROABS 8.9 (H) 10/02/2014    ASSESSMENT & PLAN:  Breast cancer of upper-outer quadrant of left female breast ([place]) Left lumpectomy08/31/2017:IDC with papillary features, grade 2, 1.1 cm, ADH, superior/medial margin positive, ER 95%, PR 95%, Ki-67 5%, HER-2 negative ratio 1.13,  Margin re-excision 03/12/2016: Negative for cancer, 0/2 lymph nodes negative Pathologic staging: T1c N0 stage IA Oncotype Dx score 0: 2% ROR Adj XRT 04/21/16 to 05/15/16  Treatment Plan: Adjuvant therapy with Anastrozole 1 mg daily X 5 years started 06/29/2016 Anastrozole toxicities: 1. Hot flashes that wake her up at 3 AM every night 2. muscle stiffness especially in the knees in the morning. I discussed with her  about switching her medication from morning to evening. She will give that a try.  I gave her instructions regarding the [employer] live strong program and the [facility] program.  Return to clinic in 6 months for follow-up  I spent 25 minutes talking to the patient of which more than half was spent in counseling and coordination of care.  No orders  of the defined types were placed in this encounter.  The patient has a good understanding of the overall plan. she agrees with it. she will call with any problems that may develop before the next visit here.   Rulon Eisenmenger, MD 10/05/16

## 2016-12-31 ENCOUNTER — Ambulatory Visit (HOSPITAL_BASED_OUTPATIENT_CLINIC_OR_DEPARTMENT_OTHER): Payer: Medicare Other | Admitting: Adult Health

## 2016-12-31 ENCOUNTER — Encounter: Payer: Self-pay | Admitting: Adult Health

## 2016-12-31 VITALS — BP 149/82 | HR 77 | Temp 98.1°F | Resp 18 | Ht 68.0 in | Wt 205.6 lb

## 2016-12-31 DIAGNOSIS — Z170 Estrogen receptor positive status [ER+]: Secondary | ICD-10-CM

## 2016-12-31 DIAGNOSIS — C50412 Malignant neoplasm of upper-outer quadrant of left female breast: Secondary | ICD-10-CM

## 2016-12-31 DIAGNOSIS — N951 Menopausal and female climacteric states: Secondary | ICD-10-CM

## 2016-12-31 NOTE — Progress Notes (Signed)
 CLINIC:  Survivorship   REASON FOR VISIT:  Routine follow-up post-treatment for a recent history of breast cancer.  BRIEF ONCOLOGIC HISTORY:    Breast cancer of upper-outer quadrant of left female breast ([facility])   01/02/2016 Initial Biopsy    Left breast biopsy 1:00: Ductal papilloma with [facility]      02/27/2016 Surgery    Left lumpectomy: IDC with papillary features, grade 2, 1.1 cm, ADH, superior/medial margin positive, ER 95%, PR 95%, Ki-67 5%, HER-2 negative ratio 1.13, T1c N0 stage IA      03/12/2016 Surgery    Left superior and medial margin reexcision: Negative for malignancy, 0/2 lymph nodes; Oncotype Dx 0 (2% ROR)       04/21/2016 - 05/18/2016 Radiation Therapy    Adj XRT (Moody): The patient initially received a dose of 42.5 Gy in 17 fractions to the breast using whole-breast tangent fields. This was delivered using a 3-D conformal technique. The patient then received a boost to the seroma. This delivered an additional 7.5 Gy in 3 fractions using a 3 field photon technique due to the depth of the seroma. The total dose was 50 Gy.      06/29/2016 -  Anti-estrogen oral therapy    Anastrozole 1 mg daily       INTERVAL HISTORY:  [name] presents to the Survivorship Clinic today for our initial meeting to review her survivorship care plan detailing her treatment course for breast cancer, as well as monitoring long-term side effects of that treatment, education regarding health maintenance, screening, and overall wellness and health promotion.     Overall, [name] reports feeling quite well today.  She is taking anastrozole and tolerating it well for the most part.  She is experiencing some hot flashes, however she is delighted that she didn't need chemotherapy, so she says she doesn't mind them a bit.  [name] denies any other questions or concerns today.      REVIEW OF SYSTEMS:  Review of Systems  Constitutional: Negative for appetite change, chills, fatigue, fever and  unexpected weight change.  HENT:   Negative for hearing loss and lump/mass.   Eyes: Negative for eye problems and icterus.  Respiratory: Negative for chest tightness, cough and shortness of breath.   Cardiovascular: Negative for chest pain, leg swelling and palpitations.  Gastrointestinal: Negative for abdominal distention and abdominal pain.  Endocrine: Negative for hot flashes.  Musculoskeletal: Negative for arthralgias.  Skin: Negative for itching, rash and wound.  Neurological: Negative for dizziness, extremity weakness and headaches.  Hematological: Negative for adenopathy. Does not bruise/bleed easily.  Psychiatric/Behavioral: Negative for depression. The patient is not nervous/anxious.   Breast: Denies any new nodularity, masses, tenderness, nipple changes, or nipple discharge.      ONCOLOGY TREATMENT TEAM:  1. Surgeon:  Dr. Toth at [facility] 2. Medical Oncologist: Dr. Gudena  3. Radiation Oncologist: Dr. Moody    PAST MEDICAL/SURGICAL HISTORY:  Past Medical History:  Diagnosis Date  . Allergy   . Breast cancer (HCC) 01/02/2016   left breast  . Chronic renal insufficiency 10/09/2014  . Dehydration 10/09/2014  . Essential hypertension 10/09/2014  . Hypertension   . Pre-diabetes   . Wears glasses    Past Surgical History:  Procedure Laterality Date  . ABDOMINAL HYSTERECTOMY    . BREAST LUMPECTOMY WITH RADIOACTIVE SEED LOCALIZATION Left 02/27/2016   Procedure: LEFT BREAST LUMPECTOMY WITH RADIOACTIVE SEED LOCALIZATION;  Surgeon: Paul Toth III, MD;  Location: [facility] OR;  Service: General;    Laterality: Left;  . BREAST LUMPECTOMY WITH SENTINEL LYMPH NODE BIOPSY Left 03/12/2016   Procedure: RE-EXCISION OF LEFT BREAST SUPERIOR  AND MEDIAL MARGIN  WITH SENTINEL LYMPH NODE BX;  Surgeon: Paul Toth III, MD;  Location: [facility];  Service: General;  Laterality: Left;  . CHOLECYSTECTOMY    . COLON SURGERY    . GASTROSTOMY N/A 10/04/2014   Procedure:  GASTROSTOMY;  Surgeon: Paul Toth III, MD;  Location: [facility];  Service: General;  Laterality: N/A;  . PARTIAL COLECTOMY    . VENTRAL HERNIA REPAIR N/A 10/04/2014   Procedure: DIAPHRAGMATIC HERNIA REPAIR;  Surgeon: Paul Toth III, MD;  Location: [facility];  Service: General;  Laterality: N/A;     ALLERGIES:  Allergies  Allergen Reactions  . Hydrocodone-Acetaminophen Other (See Comments)    "VERY SICK"     CURRENT MEDICATIONS:  Outpatient Encounter Prescriptions as of 12/31/2016  Medication Sig Note  . acetaminophen (TYLENOL) 500 MG tablet Take 500 mg by mouth daily as needed for headache.   . amLODipine (NORVASC) 5 MG tablet Take 5 mg by mouth daily.   . anastrozole (ARIMIDEX) 1 MG tablet Take 1 tablet (1 mg total) by mouth daily.   . aspirin 81 MG tablet Take 81 mg by mouth daily. 05/01/2016: Received from: [facility] ([facility]) Received Sig: 1 tablet  . Cholecalciferol (VITAMIN D3) 2000 units capsule Take 2,000 Units by mouth daily.  03/25/2016: Received from: [facility] ([facility]) Received Sig: 1 capsule  . furosemide (LASIX) 40 MG tablet Take 40 mg by mouth as needed. Swelling 05/01/2016: Received from: [facility] ([facility]) Received Sig: 1 tablet  . Multiple Vitamin (MULTIVITAMIN WITH MINERALS) TABS tablet Take 1 tablet by mouth daily.   . tiZANidine (ZANAFLEX) 2 MG tablet TAKE 1 TABLET BY MOUTH 3 TIMES A DAY AS NEEDED FOR MUSCLE SPASMS   . [DISCONTINUED] aspirin EC 81 MG tablet Take 81 mg by mouth daily.    No facility-administered encounter medications on file as of 12/31/2016.      ONCOLOGIC FAMILY HISTORY:  Non contributory    SOCIAL HISTORY:  [name] is married and lives with her husband in [place], [place].  She has 2 children and they live in [place], and [place].  [name] is currently retired.  She denies any current or  history of tobacco, alcohol, or illicit drug use.     PHYSICAL EXAMINATION:  Vital Signs:   Vitals:   12/31/16 1200  BP: (!) 149/82  Pulse: 77  Resp: 18  Temp: 98.1 F (36.7 C)   Filed Weights   12/31/16 1200  Weight: 205 lb 9.6 oz (93.3 kg)   General: Well-nourished, well-appearing female in no acute distress.  She is accompanied in clinic by her husband today.   HEENT: Head is normocephalic.  Pupils equal and reactive to light. Conjunctivae clear without exudate.  Sclerae anicteric. Oral mucosa is pink, moist.  Oropharynx is pink without lesions or erythema.  Lymph: No cervical, supraclavicular, or infraclavicular lymphadenopathy noted on palpation.  Cardiovascular: Regular rate and rhythm.. Respiratory: Clear to auscultation bilaterally. Chest expansion symmetric; breathing non-labored. Breast:  Left breast s/p lumpectomy, scar tissue healing well.  No nodularity, no masses, no skin changes noted.  Right breasts without nodules, masses, skin or nipple changes.   GI: Abdomen soft and round; non-tender, non-distended. Bowel sounds normoactive.  GU: Deferred.  Neuro:   No focal deficits. Steady gait.  Psych: Mood and affect normal and appropriate for situation.  Extremities: No edema. MSK: No focal spinal tenderness to palpation.  Full range of motion in bilateral upper extremities Skin: Warm and dry.  LABORATORY DATA:  None for this visit.  DIAGNOSTIC IMAGING:  None for this visit.      ASSESSMENT AND PLAN:  Ms.. Silva is a pleasant 76 y.o. female with Stage IA left breast invasive ductal carcinoma, ER+/PR+/HER2-, diagnosed in 12/2015 treated with lumpectomy, adjuvant radiation therapy, and anti-estrogen therapy with Anastrozole beginning in 06/2016.  She presents to the Survivorship Clinic for our initial meeting and routine follow-up post-completion of treatment for breast cancer.    1. Stage IA left breast cancer:  Ms. Silva is continuing to recover from definitive  treatment for breast cancer. She will follow-up with her medical oncologist, Dr. [name] in January, 2019 with history and physical exam per surveillance protocol.  She will continue her anti-estrogen therapy with Anastrozole. Thus far, she is tolerating the Anastrozole well, with minimal side effects with the exception of hot flashes.  I did give her some tips on how to keep these lower. She was instructed to make Dr. [name] or myself aware if she begins to experience any worsening side effects of the medication and I could see her back in clinic to help manage those side effects, as needed.  She is due for mammogram, and I ordered this today. Today, a comprehensive survivorship care plan and treatment summary was reviewed with the patient today detailing her breast cancer diagnosis, treatment course, potential late/long-term effects of treatment, appropriate follow-up care with recommendations for the future, and patient education resources.  A copy of this summary, along with a letter will be sent to the patient's primary care provider via mail/fax/In Basket message after today's visit.    2. Bone health:  Given [name]'s age/history of breast cancer and her current treatment regimen including anti-estrogen therapy with Anastrozole, she is at risk for bone demineralization.  Her last DEXA scan was on 07/13/2016 and she had a T score of 1.3 in her right femur (normal).  We reviewed diet and exercise which will promote bone health in detail.   3. Cancer screening:  Due to [name]'s history and her age, she should receive screening for skin cancers, colon cancer, and gynecologic cancers.  The information and recommendations are listed on the patient's comprehensive care plan/treatment summary and were reviewed in detail with the patient.    4. Health maintenance and wellness promotion: [name] was encouraged to consume 5-7 servings of fruits and vegetables per day. We reviewed the "Nutrition Rainbow"  handout, as well as the handout "Take Control of Your Health and Reduce Your Cancer Risk" from the [employer].  She was also encouraged to engage in moderate to vigorous exercise for 30 minutes per day most days of the week. We discussed the [employer] fitness program, which is designed for cancer survivors to help them become more physically fit after cancer treatments.  She was instructed to limit her alcohol consumption and continue to abstain from tobacco use.  5. Support services/counseling: It is not uncommon for this period of the patient's cancer care trajectory to be one of many emotions and stressors.  We discussed an opportunity for her to participate in the next session of [facility] ("Finding Your New Normal") support group series designed for patients after they have completed treatment.   [name] was encouraged  to take advantage of our many other support services programs, support groups, and/or counseling in coping with her new life as a cancer survivor after completing anti-cancer treatment.  She was offered support today through active listening and expressive supportive counseling.  She was given information regarding our available services and encouraged to contact me with any questions or for help enrolling in any of our support group/programs.    Dispo:   -Return to cancer center in January, 2019 for follow up with Dr. [name]  -Mammogram due in 12/2016 -Follow up with [facility]. Marlou Starks at [facility] in 12/2016 -She is welcome to return back to the Survivorship Clinic at any time; no additional follow-up needed at this time.  -Consider referral back to survivorship as a long-term survivor for continued surveillance  A total of (30) minutes of face-to-face time was spent with this patient with greater than 50% of that time in counseling and care-coordination.   Gardenia Phlegm, NP Survivorship Program [place] [phone]   Note:  PRIMARY CARE PROVIDER Leighton Ruff, [place] [phone]

## 2017-01-01 ENCOUNTER — Telehealth: Payer: Self-pay

## 2017-01-01 NOTE — Telephone Encounter (Signed)
Called and spoke with pt regarding her appt for MM on Monday 01/04/17 at the [facility] breast center. Pt confirmed time/date. Pt to see her surgeon on 7/10. Pt appreciative of call.

## 2017-01-04 ENCOUNTER — Ambulatory Visit
Admission: RE | Admit: 2017-01-04 | Discharge: 2017-01-04 | Disposition: A | Discharge status: Home / Self Care | Payer: Medicare Other | Source: Ambulatory Visit | Attending: Adult Health | Admitting: Adult Health

## 2017-01-04 DIAGNOSIS — Z170 Estrogen receptor positive status [ER+]: Principal | ICD-10-CM

## 2017-01-04 DIAGNOSIS — C50412 Malignant neoplasm of upper-outer quadrant of left female breast: Secondary | ICD-10-CM

## 2017-04-07 ENCOUNTER — Ambulatory Visit: Payer: Medicare Other | Admitting: Hematology and Oncology

## 2017-07-07 NOTE — Assessment & Plan Note (Signed)
Left lumpectomy08/31/2017:IDC with papillary features, grade 2, 1.1 cm, ADH, superior/medial margin positive, ER 95%, PR 95%, Ki-67 5%, HER-2 negative ratio 1.13,  Margin re-excision 03/12/2016: Negative for cancer, 0/2 lymph nodes negative Pathologic staging: T1c N0 stage IA Oncotype Dx score 0: 2% ROR Adj XRT 04/21/16 to 05/15/16  Treatment Plan: Adjuvant therapy with Anastrozole 1 mg daily X 5 years started 06/29/2016  Anastrozole toxicities: 1. Hot flashes that wake her up at 3 AM every night 2. muscle stiffness especially in the knees in the morning. I discussed with her about switching her medication from morning to evening. She will give that a try.  RTC in 1 year

## 2017-07-08 ENCOUNTER — Inpatient Hospital Stay: Payer: Medicare Other | Attending: Hematology and Oncology | Admitting: Hematology and Oncology

## 2017-07-08 DIAGNOSIS — Z170 Estrogen receptor positive status [ER+]: Secondary | ICD-10-CM

## 2017-07-08 DIAGNOSIS — M25612 Stiffness of left shoulder, not elsewhere classified: Secondary | ICD-10-CM | POA: Insufficient documentation

## 2017-07-08 DIAGNOSIS — C50412 Malignant neoplasm of upper-outer quadrant of left female breast: Secondary | ICD-10-CM | POA: Diagnosis present

## 2017-07-08 DIAGNOSIS — Z79811 Long term (current) use of aromatase inhibitors: Secondary | ICD-10-CM | POA: Insufficient documentation

## 2017-07-08 MED ORDER — ANASTROZOLE 1 MG PO TABS: 1.0000 mg | ORAL_TABLET | Freq: Every day | ORAL | 3 refills | Status: DC

## 2017-07-08 NOTE — Progress Notes (Signed)
Patient Care Team: Leighton Ruff, MD as PCP - General (Family Medicine) Nicholas Lose, MD as Consulting Physician (Hematology and Oncology) Delice Bison, Charlestine Massed, NP as Nurse Practitioner (Hematology and Oncology) Kyung Rudd, MD as Consulting Physician (Radiation Oncology) [name], MD as Consulting Physician (General Surgery)  DIAGNOSIS:  Encounter Diagnosis  Name Primary?  . Malignant neoplasm of upper-outer quadrant of left breast in female, estrogen receptor positive ([place])     SUMMARY OF ONCOLOGIC HISTORY:   Breast cancer of upper-outer quadrant of left female breast ([place])   01/02/2016 Initial Biopsy    Left breast biopsy 1:00: Ductal papilloma with [facility]      02/27/2016 Surgery    Left lumpectomy: IDC with papillary features, grade 2, 1.1 cm, ADH, superior/medial margin positive, ER 95%, PR 95%, Ki-67 5%, HER-2 negative ratio 1.13, T1c N0 stage IA      03/12/2016 Surgery    Left superior and medial margin reexcision: Negative for malignancy, 0/2 lymph nodes; Oncotype Dx 0 (2% ROR)       04/21/2016 - 05/18/2016 Radiation Therapy    Adj XRT Lisbeth Renshaw): The patient initially received a dose of 42.5 Gy in 17 fractions to the breast using whole-breast tangent fields. This was delivered using a 3-D conformal technique. The patient then received a boost to the seroma. This delivered an additional 7.5 Gy in 3 fractions using a 3 field photon technique due to the depth of the seroma. The total dose was 50 Gy.      06/29/2016 -  Anti-estrogen oral therapy    Anastrozole 1 mg daily       CHIEF COMPLIANT: Follow-up on anastrozole therapy  INTERVAL HISTORY: [name] is a 77 year old with above-mentioned history of left breast cancer treated with lumpectomy followed by adjuvant radiation.  She is currently on anastrozole therapy.  The hot flashes that she had at night have resolved she continues to have muscle stiffness especially in the left shoulder.  She has very limited  range of motion of the left shoulder.  She has learned to cope up with the symptoms.  REVIEW OF SYSTEMS:   Constitutional: Denies fevers, chills or abnormal weight loss Eyes: Denies blurriness of vision Ears, nose, mouth, throat, and face: Denies mucositis or sore throat Respiratory: Denies cough, dyspnea or wheezes Cardiovascular: Denies palpitation, chest discomfort Gastrointestinal:  Denies nausea, heartburn or change in bowel habits Skin: Denies abnormal skin rashes Lymphatics: Denies new lymphadenopathy or easy bruising Neurological:Denies numbness, tingling or new weaknesses Behavioral/Psych: Mood is stable, no new changes  Extremities: No lower extremity edema, decreased range of motion of the left shoulder Breast:  denies any pain or lumps or nodules in either breasts All other systems were reviewed with the patient and are negative.  I have reviewed the past medical history, past surgical history, social history and family history with the patient and they are unchanged from previous note.  ALLERGIES:  is allergic to hydrocodone-acetaminophen.  MEDICATIONS:  Current Outpatient Medications  Medication Sig Dispense Refill  . acetaminophen (TYLENOL) 500 MG tablet Take 500 mg by mouth daily as needed for headache.    Marland Kitchen amLODipine (NORVASC) 5 MG tablet Take 5 mg by mouth daily.    Marland Kitchen anastrozole (ARIMIDEX) 1 MG tablet Take 1 tablet (1 mg total) by mouth daily. 90 tablet 3  . aspirin 81 MG tablet Take 81 mg by mouth daily.    . Cholecalciferol (VITAMIN D3) 2000 units capsule Take 2,000 Units by mouth daily.     Marland Kitchen  furosemide (LASIX) 40 MG tablet Take 40 mg by mouth as needed. Swelling    . Multiple Vitamin (MULTIVITAMIN WITH MINERALS) TABS tablet Take 1 tablet by mouth daily.    Marland Kitchen tiZANidine (ZANAFLEX) 2 MG tablet TAKE 1 TABLET BY MOUTH 3 TIMES A DAY AS NEEDED FOR MUSCLE SPASMS  0   No current facility-administered medications for this visit.     PHYSICAL EXAMINATION: ECOG  PERFORMANCE STATUS: 1 - Symptomatic but completely ambulatory  Vitals:   07/08/17 0939  BP: (!) 172/92  Pulse: 67  Resp: 18  Temp: 97.6 F (36.4 C)  SpO2: 100%   Filed Weights   07/08/17 0939  Weight: 199 lb 8 oz (90.5 kg)    GENERAL:alert, no distress and comfortable SKIN: skin color, texture, turgor are normal, no rashes or significant lesions EYES: normal, Conjunctiva are pink and non-injected, sclera clear OROPHARYNX:no exudate, no erythema and lips, buccal mucosa, and tongue normal  NECK: supple, thyroid normal size, non-tender, without nodularity LYMPH:  no palpable lymphadenopathy in the cervical, axillary or inguinal LUNGS: clear to auscultation and percussion with normal breathing effort HEART: regular rate & rhythm and no murmurs and no lower extremity edema ABDOMEN:abdomen soft, non-tender and normal bowel sounds MUSCULOSKELETAL:no cyanosis of digits and no clubbing  NEURO: alert & oriented x 3 with fluent speech, no focal motor/sensory deficits EXTREMITIES: No lower extremity edema BREAST: No palpable masses or nodules in either right or left breasts. No palpable axillary supraclavicular or infraclavicular adenopathy no breast tenderness or nipple discharge. (exam performed in the presence of a chaperone)  LABORATORY DATA:  I have reviewed the data as listed CMP Latest Ref Rng & Units 02/20/2016 10/06/2014 10/03/2014  Glucose 65 - 99 mg/dL 98 116(H) 116(H)  BUN 6 - 20 mg/dL _0 Creatinine 0.44 - 1.00 mg/dL 1.41(H) 1.02 1.18(H)  Sodium 135 - 145 mmol/L 139 140 148(H)  Potassium 3.5 - 5.1 mmol/L 3.8 3.5 4.0  Chloride 101 - 111 mmol/L 105 113(H) 113(H)  CO2 22 - 32 mmol/L _1 Calcium 8.9 - 10.3 mg/dL 9.1 8.2(L) 8.8  Total Protein 6.0 - 8.3 g/dL - - -  Total Bilirubin 0.3 - 1.2 mg/dL - - -  Alkaline Phos 39 - 117 U/L - - -  AST 0 - 37 U/L - - -  ALT 0 - 35 U/L - - -    Lab Results  Component Value Date   WBC 6.4 02/20/2016   HGB 13.2 02/20/2016   HCT  41.1 02/20/2016   MCV 88.0 02/20/2016   PLT 356 02/20/2016   NEUTROABS 8.9 (H) 10/02/2014    ASSESSMENT & PLAN:  Breast cancer of upper-outer quadrant of left female breast ([place]) Left lumpectomy08/31/2017:IDC with papillary features, grade 2, 1.1 cm, ADH, superior/medial margin positive, ER 95%, PR 95%, Ki-67 5%, HER-2 negative ratio 1.13,  Margin re-excision 03/12/2016: Negative for cancer, 0/2 lymph nodes negative Pathologic staging: T1c N0 stage IA Oncotype Dx score 0: 2% ROR Adj XRT 04/21/16 to 05/15/16  Treatment Plan: Adjuvant therapy with Anastrozole 1 mg daily X 5 years started 06/29/2016  Anastrozole toxicities: 1. Hot flashes resolved since she started taking anastrozole at bedtime 2. muscle stiffness especially in the left shoulder with decreased range of motion: I will refer her to physical therapy for this. 3.  Vaginal dryness causing difficulty with intercourse: We provided her information and literature and products for lubrication.  RTC in 1 year  I spent 25 minutes talking to  the patient of which more than half was spent in counseling and coordination of care.  Orders Placed This Encounter  Procedures  . Ambulatory referral to Physical Therapy    Referral Priority:   Routine    Referral Type:   Physical Medicine    Referral Reason:   Specialty Services Required    Requested Specialty:   Physical Therapy    Number of Visits Requested:   1   The patient has a good understanding of the overall plan. she agrees with it. she will call with any problems that may develop before the next visit here.   Harriette Ohara, MD 07/08/17

## 2017-07-15 ENCOUNTER — Ambulatory Visit: Payer: Medicare Other | Attending: Hematology and Oncology | Admitting: Physical Therapy

## 2017-07-15 ENCOUNTER — Other Ambulatory Visit: Payer: Self-pay

## 2017-07-15 DIAGNOSIS — M25611 Stiffness of right shoulder, not elsewhere classified: Secondary | ICD-10-CM | POA: Insufficient documentation

## 2017-07-15 DIAGNOSIS — M6281 Muscle weakness (generalized): Secondary | ICD-10-CM | POA: Diagnosis present

## 2017-07-15 DIAGNOSIS — M25612 Stiffness of left shoulder, not elsewhere classified: Secondary | ICD-10-CM | POA: Diagnosis present

## 2017-07-15 DIAGNOSIS — R293 Abnormal posture: Secondary | ICD-10-CM | POA: Diagnosis present

## 2017-07-15 DIAGNOSIS — M25512 Pain in left shoulder: Secondary | ICD-10-CM

## 2017-07-15 NOTE — Therapy (Signed)
[place], [place], [address] Phone: [phone]   Fax:  [phone]  Physical Therapy Evaluation  Patient Details  Name: [name] MRN: [id number] Date of Birth: 06/07/1941 Referring Provider: Dr. [name]    Encounter Date: 07/15/2017  PT End of Session - 07/15/17 1752    Visit Number  1    Number of Visits  9    Date for PT Re-Evaluation  08/16/17    PT Start Time  3710    PT Stop Time  1430    PT Time Calculation (min)  45 min    Activity Tolerance  Patient tolerated treatment well       Past Medical History:  Diagnosis Date  . Allergy   . Breast cancer ([place]) 01/02/2016   left breast  . Chronic renal insufficiency 10/09/2014  . Dehydration 10/09/2014  . Essential hypertension 10/09/2014  . Hypertension   . Pre-diabetes   . Wears glasses     Past Surgical History:  Procedure Laterality Date  . ABDOMINAL HYSTERECTOMY    . BREAST LUMPECTOMY WITH RADIOACTIVE SEED LOCALIZATION Left 02/27/2016   Procedure: LEFT BREAST LUMPECTOMY WITH RADIOACTIVE SEED LOCALIZATION;  Surgeon: Autumn Messing III, MD;  Location: [facility];  Service: General;  Laterality: Left;  . BREAST LUMPECTOMY WITH SENTINEL LYMPH NODE BIOPSY Left 03/12/2016   Procedure: RE-EXCISION OF LEFT BREAST SUPERIOR  AND MEDIAL MARGIN  WITH SENTINEL LYMPH NODE BX;  Surgeon: Autumn Messing III, MD;  Location: [facility];  Service: General;  Laterality: Left;  . CHOLECYSTECTOMY    . COLON SURGERY    . GASTROSTOMY N/A 10/04/2014   Procedure: GASTROSTOMY;  Surgeon: Autumn Messing III, MD;  Location: [facility];  Service: General;  Laterality: N/A;  . PARTIAL COLECTOMY    . VENTRAL HERNIA REPAIR N/A 10/04/2014   Procedure: DIAPHRAGMATIC HERNIA REPAIR;  Surgeon: Autumn Messing III, MD;  Location: [facility];  Service: General;  Laterality: N/A;    There were no vitals filed for this visit.   Subjective Assessment - 07/15/17 1357    Subjective  Pt has pain in  her left shoulder and cannot raise her arm up to get something out of the cabinet or to reach behind her.  She has pinpoint pain in left middle upper shoulder.  She cannot get her tshirt on  She also has occsional neck pain     Patient Stated Goals  to help get rid of her shoulder pain     Currently in Pain?  Yes    Pain Score  3     Pain Location  Shoulder    Pain Orientation  Left;Upper;Mid it feels like its in the bone     Pain Descriptors / Indicators  Sharp    Pain Type  Chronic pain    Pain Radiating Towards  no    Pain Onset  More than a month ago    Pain Frequency  Intermittent    Aggravating Factors   reaching up and reaching behind her     Pain Relieving Factors  rest it     Effect of Pain on Daily Activities  cant put her tshirt on          [facility] PT Assessment - 07/15/17 0001      Assessment   Medical Diagnosis  left breast cancer     Referring Provider  Dr. [name]     Onset Date/Surgical Date  01/02/16    Hand  Dominance  Left      Precautions   Precautions  None      Restrictions   Weight Bearing Restrictions  No      Balance Screen   Has the patient fallen in the past 6 months  No    Has the patient had a decrease in activity level because of a fear of falling?   No    Is the patient reluctant to leave their home because of a fear of falling?   No      Home Environment   Living Environment  Private residence    Living Arrangements  Spouse/significant other      Prior Function   Level of Independence  Independent;Needs assistance with ADLs husband helps her put her t shirts on     Vocation  Retired    [employer] at her [employer] , takes care of infant grandson occasionally     Leisure  not regular exerciser       Cognition   Overall Cognitive Status  Within Functional Limits for tasks assessed      Observation/Other Assessments   Observations   Pt has fullness of both upper arms with excess adipose tissue. pt with well healed insicions on  left breast.  She feels left breast is larger than right ,but does not fell full or tight to her     Skin Integrity  no open areas    Quick DASH   52.27      Sensation   Light Touch  Not tested      Coordination   Gross Motor Movements are Fluid and Coordinated  No limited in both arms with overhead activity       Posture/Postural Control   Posture/Postural Control  Postural limitations    Postural Limitations  Rounded Shoulders;Forward head    Posture Comments  obesity in upper body       ROM / Strength   AROM / PROM / Strength  AROM;PROM;Strength      AROM   Right Shoulder Extension  45 Degrees    Right Shoulder Flexion  125 Degrees    Right Shoulder ABduction  95 Degrees    Left Shoulder Extension  50 Degrees    Left Shoulder Flexion  127 Degrees    Left Shoulder ABduction  70 Degrees limited by pain      PROM   Overall PROM Comments  measured in supine     Left Shoulder Flexion  130 Degrees    Left Shoulder External Rotation  75 Degrees      Strength   Overall Strength  Deficits    Overall Strength Comments  unable to lift either arm over head with increased pain in left shoulder,  UE strength 3-/5       Palpation   Palpation comment  tenderness with palpation over left long head of biceps tendon  near A-C joint, extremely tender trigger points at left lateral scapular border and posterior shoudler       Neer Impingement test    Findings  Positive severe pain     Side  Left      Hawkins-Kennedy test   Findings  Negative    Side  Left      Drop Arm test   Findings  Negative    Side  Left      Painful Arc of Motion   Findings  Positive    Side  Left  LYMPHEDEMA/ONCOLOGY QUESTIONNAIRE - 07/15/17 1424      Right Upper Extremity Lymphedema   10 cm Proximal to Olecranon Process  39 cm    Olecranon Process  29 cm    15 cm Proximal to Ulnar Styloid Process  30 cm    Just Proximal to Ulnar Styloid Process  18.5 cm    Across Hand at [employer]  20  cm    At [place] of 2nd Digit  6.5 cm      Left Upper Extremity Lymphedema   10 cm Proximal to Olecranon Process  39 cm    Olecranon Process  29 cm    15 cm Proximal to Ulnar Styloid Process  30 cm    Just Proximal to Ulnar Styloid Process  18.7 cm    Across Hand at [employer]  20 cm    At [place] of 2nd Digit  6.5 cm        Quick Dash - 07/15/17 0001    Open a tight or new jar  Moderate difficulty    Do heavy household chores (wash walls, wash floors)  Mild difficulty    Carry a shopping bag or briefcase  Severe difficulty    Wash your back  Moderate difficulty    Use a knife to cut food  Mild difficulty    Recreational activities in which you take some force or impact through your arm, shoulder, or hand (golf, hammering, tennis)  Unable    During the past week, to what extent has your arm, shoulder or hand problem interfered with your normal social activities with family, friends, neighbors, or groups?  Quite a bit    During the past week, to what extent has your arm, shoulder or hand problem limited your work or other regular daily activities  Slightly    Arm, shoulder, or hand pain.  Severe    Tingling (pins and needles) in your arm, shoulder, or hand  None    Difficulty Sleeping  Severe difficulty    DASH Score  52.27 %       Objective measurements completed on examination: See above findings.      [place] Adult PT Treatment/Exercise - 07/15/17 0001      Shoulder Exercises: Supine   Other Supine Exercises  supine dowel rod flexion x 5 times also instructed for home exercise                   PT Long Term Goals - 07/15/17 1805      PT LONG TERM GOAL #1   Title  Pt will report overall decreased pain in left shoulder by 50%     Time  4    Period  Weeks    Status  New      PT LONG TERM GOAL #2   Title  Pt will have increased range of motion of both shoulders so that she can put her t shirt on and off by herself      Time  4    Period  Weeks    Status  New       PT LONG TERM GOAL #3   Title  Pt will be independent in a home exercise program for shoulder range of motion and strength     Time  4    Period  Weeks    Status  New      PT LONG TERM GOAL #4   Title  Pt will improve Quick  DASH score to < 40 indicating and improvement in functional use of upper body     Baseline  52.27    Time  4    Period  Weeks    Status  New             Plan - 07/15/17 1753    Clinical Impression Statement  77 yo female more than one year post left lumpectomy with radiation presents with pain and decreased function of left shoulder. She does not appear to have extremtiy lymphedema but does report she left breast is larger. (Will asses that more next session)  She has decreased range of motion and strength of both arms with forward posture.  She will benefit from PT treatment to increase strength, range of motion and posture with possible use of iontophoresis and manual techniques to decrease pain. If her pain and function are not signiticantly improved in 4 weeks, she may need an orthopedic consult for further evaluation of possible impingement syndrome.     History and Personal Factors relevant to plan of care:  previous radiation, breast surgery x2, 77 yo.     Clinical Presentation  Stable    Clinical Decision Making  Low    Rehab Potential  Good    Clinical Impairments Affecting Rehab Potential  previous radiation     PT Frequency  2x / week    PT Duration  4 weeks    PT Treatment/Interventions  ADLs/Self Care Home Management;Iontophoresis 4mg /ml Dexamethasone;Scar mobilization;Passive range of motion;Therapeutic activities;Therapeutic exercise;Orthotic Fit/Training;Patient/family education;Manual techniques;Manual lymph drainage;Compression bandaging;Taping    PT Next Visit Plan  assess left breast for lymphedema and add diagnosis/ goal if determined and make recommendation for compression bra if needed. Teach Meeks decompression and begin scapular and  extremity strenthening to both arms and progress HEP  Manual techniques as indicted to tight muscle around the shoulder and neck.  Use iontophoresis as indicted     PT Home Exercise Plan  supine dowel flexion     Consulted and Agree with Plan of Care  Patient       Patient will benefit from skilled therapeutic intervention in order to improve the following deficits and impairments:  Decreased knowledge of use of DME, Increased fascial restricitons, Pain, Postural dysfunction, Increased muscle spasms, Decreased activity tolerance, Decreased range of motion, Decreased strength, Impaired UE functional use  Visit Diagnosis: Acute pain of left shoulder - Plan: PT plan of care cert/re-cert  Stiffness of left shoulder, not elsewhere classified - Plan: PT plan of care cert/re-cert  Stiffness of right shoulder, not elsewhere classified - Plan: PT plan of care cert/re-cert  Abnormal posture - Plan: PT plan of care cert/re-cert  Muscle weakness (generalized) - Plan: PT plan of care cert/re-cert     Problem List Patient Active Problem List   Diagnosis Date Noted  . Breast cancer of upper-outer quadrant of left female breast ([place]) 03/19/2016  . Dehydration 10/09/2014  . Essential hypertension 10/09/2014  . Chronic renal insufficiency 10/09/2014  . [place] hernia 10/02/2014   Donato Heinz. Owens Shark PT  [place] [facility] 07/15/2017, [place], [place], [address] Phone: [phone]   Fax:  [phone]  Name: Kalin Amrhein MRN: [id number] Date of Birth: April 30, 1941

## 2017-07-16 ENCOUNTER — Ambulatory Visit: Payer: Medicare Other | Admitting: Physical Therapy

## 2017-07-16 DIAGNOSIS — R293 Abnormal posture: Secondary | ICD-10-CM

## 2017-07-16 DIAGNOSIS — M25612 Stiffness of left shoulder, not elsewhere classified: Secondary | ICD-10-CM

## 2017-07-16 DIAGNOSIS — M6281 Muscle weakness (generalized): Secondary | ICD-10-CM

## 2017-07-16 DIAGNOSIS — M25512 Pain in left shoulder: Secondary | ICD-10-CM

## 2017-07-16 NOTE — Patient Instructions (Signed)

## 2017-07-16 NOTE — Therapy (Signed)
[place], [place], [address] Phone: [phone]   Fax:  [phone]  Physical Therapy Treatment  Patient Details  Name: [name] MRN: [id number] Date of Birth: Sep 23, 1940 Referring Provider: Dr. [name]    Encounter Date: 07/16/2017  PT End of Session - 07/16/17 0939    Visit Number  2    Number of Visits  9    Date for PT Re-Evaluation  08/16/17    PT Start Time  0850    PT Stop Time  0933    PT Time Calculation (min)  43 min    Activity Tolerance  Patient tolerated treatment well    Behavior During Therapy  [facility] for tasks assessed/performed       Past Medical History:  Diagnosis Date  . Allergy   . Breast cancer ([place]) 01/02/2016   left breast  . Chronic renal insufficiency 10/09/2014  . Dehydration 10/09/2014  . Essential hypertension 10/09/2014  . Hypertension   . Pre-diabetes   . Wears glasses     Past Surgical History:  Procedure Laterality Date  . ABDOMINAL HYSTERECTOMY    . BREAST LUMPECTOMY WITH RADIOACTIVE SEED LOCALIZATION Left 02/27/2016   Procedure: LEFT BREAST LUMPECTOMY WITH RADIOACTIVE SEED LOCALIZATION;  Surgeon: Autumn Messing III, MD;  Location: [place];  Service: General;  Laterality: Left;  . BREAST LUMPECTOMY WITH SENTINEL LYMPH NODE BIOPSY Left 03/12/2016   Procedure: RE-EXCISION OF LEFT BREAST SUPERIOR  AND MEDIAL MARGIN  WITH SENTINEL LYMPH NODE BX;  Surgeon: Autumn Messing III, MD;  Location: [place];  Service: General;  Laterality: Left;  . CHOLECYSTECTOMY    . COLON SURGERY    . GASTROSTOMY N/A 10/04/2014   Procedure: GASTROSTOMY;  Surgeon: Autumn Messing III, MD;  [facility];  Service: General;  Laterality: N/A;  . PARTIAL COLECTOMY    . VENTRAL HERNIA REPAIR N/A 10/04/2014   Procedure: DIAPHRAGMATIC HERNIA REPAIR;  Surgeon: Autumn Messing III, MD;  [facility];  Service: General;  Laterality: N/A;    There were no vitals filed for this visit.  Subjective  Assessment - 07/16/17 0856    Subjective  Did the stick exercises and it pulled a little bit.     Currently in Pain?  No/denies         [facility] PT Assessment - 07/16/17 0001      Observation/Other Assessments   Observations  Looked at both breasts to compare size and tissue texture.  There is little visible difference between the two, though the left doesn't drop as low as the right.  Left breast tissue does not feel indurated today.  She does report intermittent soreness at lateral breast around incision area, but this is not sore today and is unpredictable.        LYMPHEDEMA/ONCOLOGY QUESTIONNAIRE - 07/15/17 1424      Right Upper Extremity Lymphedema   10 cm Proximal to Olecranon Process  39 cm    Olecranon Process  29 cm    15 cm Proximal to Ulnar Styloid Process  30 cm    Just Proximal to Ulnar Styloid Process  18.5 cm    Across Hand at [employer]  20 cm    At [place] of 2nd Digit  6.5 cm      Left Upper Extremity Lymphedema   10 cm Proximal to Olecranon Process  39 cm    Olecranon Process  29 cm    15 cm Proximal to Ulnar  [place], [place], [address] Phone: [phone]   Fax:  [phone]  Physical Therapy Treatment  Patient Details  Name: [name] MRN: [id number] Date of Birth: Sep 23, 1940 Referring Provider: Dr. [name]    Encounter Date: 07/16/2017  PT End of Session - 07/16/17 0939    Visit Number  2    Number of Visits  9    Date for PT Re-Evaluation  08/16/17    PT Start Time  0850    PT Stop Time  0933    PT Time Calculation (min)  43 min    Activity Tolerance  Patient tolerated treatment well    Behavior During Therapy  [facility] for tasks assessed/performed       Past Medical History:  Diagnosis Date  . Allergy   . Breast cancer ([place]) 01/02/2016   left breast  . Chronic renal insufficiency 10/09/2014  . Dehydration 10/09/2014  . Essential hypertension 10/09/2014  . Hypertension   . Pre-diabetes   . Wears glasses     Past Surgical History:  Procedure Laterality Date  . ABDOMINAL HYSTERECTOMY    . BREAST LUMPECTOMY WITH RADIOACTIVE SEED LOCALIZATION Left 02/27/2016   Procedure: LEFT BREAST LUMPECTOMY WITH RADIOACTIVE SEED LOCALIZATION;  Surgeon: Autumn Messing III, MD;  Location: [place];  Service: General;  Laterality: Left;  . BREAST LUMPECTOMY WITH SENTINEL LYMPH NODE BIOPSY Left 03/12/2016   Procedure: RE-EXCISION OF LEFT BREAST SUPERIOR  AND MEDIAL MARGIN  WITH SENTINEL LYMPH NODE BX;  Surgeon: Autumn Messing III, MD;  Location: [place];  Service: General;  Laterality: Left;  . CHOLECYSTECTOMY    . COLON SURGERY    . GASTROSTOMY N/A 10/04/2014   Procedure: GASTROSTOMY;  Surgeon: Autumn Messing III, MD;  [facility];  Service: General;  Laterality: N/A;  . PARTIAL COLECTOMY    . VENTRAL HERNIA REPAIR N/A 10/04/2014   Procedure: DIAPHRAGMATIC HERNIA REPAIR;  Surgeon: Autumn Messing III, MD;  [facility];  Service: General;  Laterality: N/A;    There were no vitals filed for this visit.  Subjective  Assessment - 07/16/17 0856    Subjective  Did the stick exercises and it pulled a little bit.     Currently in Pain?  No/denies         [facility] PT Assessment - 07/16/17 0001      Observation/Other Assessments   Observations  Looked at both breasts to compare size and tissue texture.  There is little visible difference between the two, though the left doesn't drop as low as the right.  Left breast tissue does not feel indurated today.  She does report intermittent soreness at lateral breast around incision area, but this is not sore today and is unpredictable.        LYMPHEDEMA/ONCOLOGY QUESTIONNAIRE - 07/15/17 1424      Right Upper Extremity Lymphedema   10 cm Proximal to Olecranon Process  39 cm    Olecranon Process  29 cm    15 cm Proximal to Ulnar Styloid Process  30 cm    Just Proximal to Ulnar Styloid Process  18.5 cm    Across Hand at [employer]  20 cm    At [place] of 2nd Digit  6.5 cm      Left Upper Extremity Lymphedema   10 cm Proximal to Olecranon Process  39 cm    Olecranon Process  29 cm    15 cm Proximal to Ulnar  PT Frequency  2x / week    PT Duration  4 weeks    PT Treatment/Interventions  ADLs/Self Care Home Management;Iontophoresis 4mg /ml Dexamethasone;Scar mobilization;Passive range of  motion;Therapeutic activities;Therapeutic exercise;Orthotic Fit/Training;Patient/family education;Manual techniques;Manual lymph drainage;Compression bandaging;Taping    PT Next Visit Plan  Review Meeks exercises and begin scapular and extremity strengthening to both arms.  Include P/AA/A/ROM to shoulders and manual techniques as needed to tight muscles around shoulder and neck.  Iontophoresis as indicated.    PT Home Exercise Plan  supine dowel flexion 2-3x/day; Meeks 1x/day    Consulted and Agree with Plan of Care  Patient       Patient will benefit from skilled therapeutic intervention in order to improve the following deficits and impairments:  Decreased knowledge of use of DME, Increased fascial restricitons, Pain, Postural dysfunction, Increased muscle spasms, Decreased activity tolerance, Decreased range of motion, Decreased strength, Impaired UE functional use  Visit Diagnosis: Acute pain of left shoulder  Stiffness of left shoulder, not elsewhere classified  Abnormal posture  Muscle weakness (generalized)     Problem List Patient Active Problem List   Diagnosis Date Noted  . Breast cancer of upper-outer quadrant of left female breast ([place]) 03/19/2016  . Dehydration 10/09/2014  . Essential hypertension 10/09/2014  . Chronic renal insufficiency 10/09/2014  . [place] hernia 10/02/2014    Kiandria Clum 07/16/2017, 9:45 AM  [place], [place], [address] Phone: [phone]   Fax:  [phone]  Name: Yulia Ulrich MRN: [id number] Date of Birth: Apr 16, 1941  Serafina Royals, PT 07/16/17 9:46 AM

## 2017-07-21 ENCOUNTER — Ambulatory Visit: Payer: Medicare Other | Admitting: Physical Therapy

## 2017-07-21 ENCOUNTER — Encounter: Payer: Self-pay | Admitting: Physical Therapy

## 2017-07-21 DIAGNOSIS — M25512 Pain in left shoulder: Secondary | ICD-10-CM | POA: Diagnosis not present

## 2017-07-21 DIAGNOSIS — M6281 Muscle weakness (generalized): Secondary | ICD-10-CM

## 2017-07-21 DIAGNOSIS — R293 Abnormal posture: Secondary | ICD-10-CM

## 2017-07-21 DIAGNOSIS — M25612 Stiffness of left shoulder, not elsewhere classified: Secondary | ICD-10-CM

## 2017-07-21 DIAGNOSIS — M25611 Stiffness of right shoulder, not elsewhere classified: Secondary | ICD-10-CM

## 2017-07-21 NOTE — Patient Instructions (Signed)

## 2017-07-21 NOTE — Therapy (Signed)
[place], [place], [address] Phone: [phone]   Fax:  [phone]  Physical Therapy Treatment  Patient Details  Name: [name] MRN: [id number] Date of Birth: 28-Nov-1940 Referring Provider: Dr. [name]    Encounter Date: 07/21/2017  PT End of Session - 07/21/17 0931    Visit Number  3    Date for PT Re-Evaluation  08/16/17    PT Start Time  0848    PT Stop Time  0932    PT Time Calculation (min)  44 min    Activity Tolerance  Patient tolerated treatment well    Behavior During Therapy  [facility] for tasks assessed/performed       Past Medical History:  Diagnosis Date  . Allergy   . Breast cancer ([place]) 01/02/2016   left breast  . Chronic renal insufficiency 10/09/2014  . Dehydration 10/09/2014  . Essential hypertension 10/09/2014  . Hypertension   . Pre-diabetes   . Wears glasses     Past Surgical History:  Procedure Laterality Date  . ABDOMINAL HYSTERECTOMY    . BREAST LUMPECTOMY WITH RADIOACTIVE SEED LOCALIZATION Left 02/27/2016   Procedure: LEFT BREAST LUMPECTOMY WITH RADIOACTIVE SEED LOCALIZATION;  Surgeon: Autumn Messing III, MD;  Location: [place];  Service: General;  Laterality: Left;  . BREAST LUMPECTOMY WITH SENTINEL LYMPH NODE BIOPSY Left 03/12/2016   Procedure: RE-EXCISION OF LEFT BREAST SUPERIOR  AND MEDIAL MARGIN  WITH SENTINEL LYMPH NODE BX;  Surgeon: Autumn Messing III, MD;  Location: [place];  Service: General;  Laterality: Left;  . CHOLECYSTECTOMY    . COLON SURGERY    . GASTROSTOMY N/A 10/04/2014   Procedure: GASTROSTOMY;  Surgeon: Autumn Messing III, MD;  [facility];  Service: General;  Laterality: N/A;  . PARTIAL COLECTOMY    . VENTRAL HERNIA REPAIR N/A 10/04/2014   Procedure: DIAPHRAGMATIC HERNIA REPAIR;  Surgeon: Autumn Messing III, MD;  [facility];  Service: General;  Laterality: N/A;    There were no vitals filed for this visit.  Subjective Assessment - 07/21/17  0854    Subjective  I do good during the day, it hurts worse at night. It might be the way I sleep     Patient Stated Goals  to help get rid of her shoulder pain     Currently in Pain?  No/denies                      [facility] Adult PT Treatment/Exercise - 07/21/17 0001      Neck Exercises: Seated   Other Seated Exercise  neck stretches in all directions with scapular motions pt with pain with left lateral cervical flexion       Shoulder Exercises: Supine   Other Supine Exercises  supine scapular series with yellow theraband x 5 reps  needs review for technique       Shoulder Exercises: Seated   Other Seated Exercises  UE ranger forward flexion and circumduction with hand at elbow heigth       Shoulder Exercises: Pulleys   Flexion  1 minute    ABduction  1 minute within partial range       Shoulder Exercises: Therapy Ball   Flexion  5 reps Pain in right shoulder when trying to take ball up wall                   PT Long Term Goals - 07/15/17 1805  PT LONG TERM GOAL #1   Title  Pt will report overall decreased pain in left shoulder by 50%     Time  4    Period  Weeks    Status  New      PT LONG TERM GOAL #2   Title  Pt will have increased range of motion of both shoulders so that she can put her t shirt on and off by herself      Time  4    Period  Weeks    Status  New      PT LONG TERM GOAL #3   Title  Pt will be independent in a home exercise program for shoulder range of motion and strength     Time  4    Period  Weeks    Status  New      PT LONG TERM GOAL #4   Title  Pt will improve Quick DASH score to < 40 indicating and improvement in functional use of upper body     Baseline  52.27    Time  4    Period  Weeks    Status  New            Plan - 07/21/17 0932    Clinical Impression Statement  Focused on active exercise today and upgraded HEP to include scapular strengthening.  Pt appears to benefit from AROM to neck and scapula  and need to increase UE strength     Rehab Potential  Good    Clinical Impairments Affecting Rehab Potential  previous radiation     PT Frequency  2x / week    PT Duration  4 weeks    PT Next Visit Plan  Review Meeks exercises and supine scapular strength .  perform extremity strengthening to both arms.  Include P/AA/A/ROM to shoulders and manual techniques as needed to tight muscles around shoulder and neck.  Iontophoresis as indicated.    Consulted and Agree with Plan of Care  Patient       Patient will benefit from skilled therapeutic intervention in order to improve the following deficits and impairments:  Decreased knowledge of use of DME, Increased fascial restricitons, Pain, Postural dysfunction, Increased muscle spasms, Decreased activity tolerance, Decreased range of motion, Decreased strength, Impaired UE functional use  Visit Diagnosis: Acute pain of left shoulder  Stiffness of left shoulder, not elsewhere classified  Abnormal posture  Muscle weakness (generalized)  Stiffness of right shoulder, not elsewhere classified     Problem List Patient Active Problem List   Diagnosis Date Noted  . Breast cancer of upper-outer quadrant of left female breast ([place]) 03/19/2016  . Dehydration 10/09/2014  . Essential hypertension 10/09/2014  . Chronic renal insufficiency 10/09/2014  . [place] hernia 10/02/2014   Donato Heinz. Owens Shark PT  Norwood Levo 07/21/2017, 9:34 AM  [place], [place], [address] Phone: [phone]   Fax:  [phone]  Name: Idolina Mantell MRN: [id number] Date of Birth: 04/16/1941

## 2017-07-26 ENCOUNTER — Ambulatory Visit: Payer: Medicare Other

## 2017-07-26 DIAGNOSIS — M25512 Pain in left shoulder: Secondary | ICD-10-CM

## 2017-07-26 DIAGNOSIS — M6281 Muscle weakness (generalized): Secondary | ICD-10-CM

## 2017-07-26 DIAGNOSIS — M25612 Stiffness of left shoulder, not elsewhere classified: Secondary | ICD-10-CM

## 2017-07-26 DIAGNOSIS — R293 Abnormal posture: Secondary | ICD-10-CM

## 2017-07-26 DIAGNOSIS — M25611 Stiffness of right shoulder, not elsewhere classified: Secondary | ICD-10-CM

## 2017-07-26 NOTE — Therapy (Signed)
[place], [place], [address] Phone: [phone]   Fax:  [phone]  Physical Therapy Treatment  Patient Details  Name: [name] MRN: [id number] Date of Birth: May 10, 1941 Referring Provider: Dr. [name]    Encounter Date: 07/26/2017  PT End of Session - 07/26/17 1022    Visit Number  4    Number of Visits  9    Date for PT Re-Evaluation  08/16/17    PT Start Time  0938    PT Stop Time  1018    PT Time Calculation (min)  40 min    Activity Tolerance  Patient tolerated treatment well    Behavior During Therapy  [facility] for tasks assessed/performed       Past Medical History:  Diagnosis Date  . Allergy   . Breast cancer ([place]) 01/02/2016   left breast  . Chronic renal insufficiency 10/09/2014  . Dehydration 10/09/2014  . Essential hypertension 10/09/2014  . Hypertension   . Pre-diabetes   . Wears glasses     Past Surgical History:  Procedure Laterality Date  . ABDOMINAL HYSTERECTOMY    . BREAST LUMPECTOMY WITH RADIOACTIVE SEED LOCALIZATION Left 02/27/2016   Procedure: LEFT BREAST LUMPECTOMY WITH RADIOACTIVE SEED LOCALIZATION;  Surgeon: Autumn Messing III, MD;  Location: [place];  Service: General;  Laterality: Left;  . BREAST LUMPECTOMY WITH SENTINEL LYMPH NODE BIOPSY Left 03/12/2016   Procedure: RE-EXCISION OF LEFT BREAST SUPERIOR  AND MEDIAL MARGIN  WITH SENTINEL LYMPH NODE BX;  Surgeon: Autumn Messing III, MD;  Location: [place];  Service: General;  Laterality: Left;  . CHOLECYSTECTOMY    . COLON SURGERY    . GASTROSTOMY N/A 10/04/2014   Procedure: GASTROSTOMY;  Surgeon: Autumn Messing III, MD;  [facility];  Service: General;  Laterality: N/A;  . PARTIAL COLECTOMY    . VENTRAL HERNIA REPAIR N/A 10/04/2014   Procedure: DIAPHRAGMATIC HERNIA REPAIR;  Surgeon: Autumn Messing III, MD;  [facility];  Service: General;  Laterality: N/A;    There were no vitals filed for this visit.  Subjective  Assessment - 07/26/17 0940    Subjective  I am doing so much better now! I have been able to sleep all night since Friday night. I've been doing the exercises she gave me and I feel really good.     Patient Stated Goals  to help get rid of her shoulder pain     Currently in Pain?  No/denies         [facility] PT Assessment - 07/26/17 0001      AROM   Right Shoulder Flexion  125 Degrees    Right Shoulder ABduction  128 Degrees No pain    Left Shoulder Flexion  130 Degrees    Left Shoulder ABduction  114 Degrees No pain                  [facility] Adult PT Treatment/Exercise - 07/26/17 0001      Shoulder Exercises: Standing   Other Standing Exercises  Bil 3 way raises with 1 lb (2 lbs too heavy/unable to decrease scapular compensations) 10 times each against wall with core engaged and shoulders and head against wall. Max VCs to decrease scapular compensations and keep elbows straight      Shoulder Exercises: Pulleys   Flexion  2 minutes    Flexion Limitations  VCs to decrease scapular compensations    ABduction  2 minutes  ABduction Limitations  VCs to decrease trunk lean and scapular compensations      Shoulder Exercises: Therapy Ball   Flexion  10 reps No pain; with forward lean into end of stretch    ABduction  10 reps Lt UE 10x no pain, Rt UE 5 times      Manual Therapy   Manual Therapy  Passive ROM    Passive ROM  To Lt shoulder briefly at end of session in sitting into flexion and abduction stabilizing scapula throughout             PT Education - 07/26/17 1021    Education provided  Yes    Education Details  Bil UE 3 way raises    Person(s) Educated  Patient    Methods  Explanation;Demonstration;Handout    Comprehension  Verbalized understanding;Returned demonstration;Need further instruction          PT Long Term Goals - 07/15/17 1805      PT LONG TERM GOAL #1   Title  Pt will report overall decreased pain in left shoulder by 50%     Time  4     Period  Weeks    Status  New      PT LONG TERM GOAL #2   Title  Pt will have increased range of motion of both shoulders so that she can put her t shirt on and off by herself      Time  4    Period  Weeks    Status  New      PT LONG TERM GOAL #3   Title  Pt will be independent in a home exercise program for shoulder range of motion and strength     Time  4    Period  Weeks    Status  New      PT LONG TERM GOAL #4   Title  Pt will improve Quick DASH score to < 40 indicating and improvement in functional use of upper body     Baseline  52.27    Time  4    Period  Weeks    Status  New            Plan - 07/26/17 1031    Clinical Impression Statement  Pt has made excellent progress since last week with pain and A/ROM and has been working on her HEP. Progressed her HEP to include standing 3 way raises and spent alot of time instructing pt with exercises and in front of mirror to decrease scapular compensation with A/ROM.     Rehab Potential  Good    Clinical Impairments Affecting Rehab Potential  previous radiation     PT Frequency  2x / week    PT Duration  4 weeks    PT Treatment/Interventions  ADLs/Self Care Home Management;Iontophoresis 4mg /ml Dexamethasone;Scar mobilization;Passive range of motion;Therapeutic activities;Therapeutic exercise;Orthotic Fit/Training;Patient/family education;Manual techniques;Manual lymph drainage;Compression bandaging;Taping    PT Next Visit Plan  Review supine scapular strength (issue red theraband) and 3 way raises. Cont with focus to decrease scapular compensations with A/ROM. Include P/AA/A/ROM to shoulders and manual techniques as needed to tight muscles around shoulder and neck.  Iontophoresis as indicated.    Consulted and Agree with Plan of Care  Patient       Patient will benefit from skilled therapeutic intervention in order to improve the following deficits and impairments:  Decreased knowledge of use of DME, Increased fascial  restricitons, Pain, Postural dysfunction, Increased muscle spasms,  Decreased activity tolerance, Decreased range of motion, Decreased strength, Impaired UE functional use  Visit Diagnosis: Acute pain of left shoulder  Stiffness of left shoulder, not elsewhere classified  Abnormal posture  Muscle weakness (generalized)  Stiffness of right shoulder, not elsewhere classified     Problem List Patient Active Problem List   Diagnosis Date Noted  . Breast cancer of upper-outer quadrant of left female breast ([place]) 03/19/2016  . Dehydration 10/09/2014  . Essential hypertension 10/09/2014  . Chronic renal insufficiency 10/09/2014  . [place] hernia 10/02/2014    Otelia Limes, PTA 07/26/2017, 10:48 AM  [place], [place], [address] Phone: [phone]   Fax:  [phone]  Name: [name] MRN: [id number] Date of Birth: 09-Apr-1941

## 2017-07-26 NOTE — Patient Instructions (Signed)

## 2017-07-28 ENCOUNTER — Ambulatory Visit: Payer: Medicare Other

## 2017-07-28 DIAGNOSIS — M25512 Pain in left shoulder: Secondary | ICD-10-CM | POA: Diagnosis not present

## 2017-07-28 DIAGNOSIS — M25612 Stiffness of left shoulder, not elsewhere classified: Secondary | ICD-10-CM

## 2017-07-28 DIAGNOSIS — M25611 Stiffness of right shoulder, not elsewhere classified: Secondary | ICD-10-CM

## 2017-07-28 DIAGNOSIS — R293 Abnormal posture: Secondary | ICD-10-CM

## 2017-07-28 DIAGNOSIS — M6281 Muscle weakness (generalized): Secondary | ICD-10-CM

## 2017-07-28 NOTE — Therapy (Signed)
[place], [place], [address] Phone: [phone]   Fax:  [phone]  Physical Therapy Treatment  Patient Details  Name: [name] MRN: [id number] Date of Birth: [birth year] Referring Provider: Dr. [name]    Encounter Date: 07/28/2017  PT End of Session - 07/28/17 1115    Visit Number  5    Number of Visits  9    Date for PT Re-Evaluation  08/16/17    PT Start Time  1020    PT Stop Time  1105    PT Time Calculation (min)  45 min    Activity Tolerance  Patient tolerated treatment well    Behavior During Therapy  [facility] for tasks assessed/performed       Past Medical History:  Diagnosis Date  . Allergy   . Breast cancer ([place]) 01/02/2016   left breast  . Chronic renal insufficiency 10/09/2014  . Dehydration 10/09/2014  . Essential hypertension 10/09/2014  . Hypertension   . Pre-diabetes   . Wears glasses     Past Surgical History:  Procedure Laterality Date  . ABDOMINAL HYSTERECTOMY    . BREAST LUMPECTOMY WITH RADIOACTIVE SEED LOCALIZATION Left 02/27/2016   Procedure: LEFT BREAST LUMPECTOMY WITH RADIOACTIVE SEED LOCALIZATION;  Surgeon: Autumn Messing III, MD;  Location: [place];  Service: General;  Laterality: Left;  . BREAST LUMPECTOMY WITH SENTINEL LYMPH NODE BIOPSY Left 03/12/2016   Procedure: RE-EXCISION OF LEFT BREAST SUPERIOR  AND MEDIAL MARGIN  WITH SENTINEL LYMPH NODE BX;  Surgeon: Autumn Messing III, MD;  Location: [place];  Service: General;  Laterality: Left;  . CHOLECYSTECTOMY    . COLON SURGERY    . GASTROSTOMY N/A 10/04/2014   Procedure: GASTROSTOMY;  Surgeon: Autumn Messing III, MD;  [facility];  Service: General;  Laterality: N/A;  . PARTIAL COLECTOMY    . VENTRAL HERNIA REPAIR N/A 10/04/2014   Procedure: DIAPHRAGMATIC HERNIA REPAIR;  Surgeon: Autumn Messing III, MD;  [facility];  Service: General;  Laterality: N/A;    There were no vitals filed for this visit.  Subjective  Assessment - 07/28/17 1024    Subjective  I'm still feeling good, didn't do my exercises yesterday as much because I was watching my 57 month old grandson.    Patient Stated Goals  to help get rid of her shoulder pain     Currently in Pain?  No/denies                      [facility] Adult PT Treatment/Exercise - 07/28/17 0001      Shoulder Exercises: Supine   Other Supine Exercises  supine scapular series with red theraband x 10 for horz abd and er, then x 5 for remaining, required mod VCs for correct technique for all exs.      Shoulder Exercises: Standing   Other Standing Exercises  Bil 3 way raises with 1 lb, x10 each against wall with core engaged and shoulders and head against wall. Mod VCs to decrease scapular compensations, pt did much better with controlling scapular compensations today though still with some difficulty.      Shoulder Exercises: Pulleys   Flexion  2 minutes    Flexion Limitations  VCs to remind pt of decrease scapular compensation    ABduction  2 minutes    ABduction Limitations  VCs to remind pt of correct technique      Shoulder Exercises: Therapy Ball   Flexion  10 reps With forward lean into end of stretch    ABduction  10 reps Bil UE with same side lean 10x each      Manual Therapy   Manual Therapy  Passive ROM    Passive ROM  In Supine to bil shoulders into flexion, abduction and er to pts tolerance             PT Education - 07/28/17 1112    Education provided  Yes    Education Details  Reviewed HEP of 3 way raises and supine scapular series with pt issuing red theraband today as pt reported the yellow had become easier.     Person(s) Educated  Patient    Methods  Explanation;Verbal cues    Comprehension  Verbalized understanding;Returned demonstration          PT Long Term Goals - 07/15/17 1805      PT LONG TERM GOAL #1   Title  Pt will report overall decreased pain in left shoulder by 50%     Time  4    Period  Weeks     Status  New      PT LONG TERM GOAL #2   Title  Pt will have increased range of motion of both shoulders so that she can put her t shirt on and off by herself      Time  4    Period  Weeks    Status  New      PT LONG TERM GOAL #3   Title  Pt will be independent in a home exercise program for shoulder range of motion and strength     Time  4    Period  Weeks    Status  New      PT LONG TERM GOAL #4   Title  Pt will improve Quick DASH score to < 40 indicating and improvement in functional use of upper body     Baseline  52.27    Time  4    Period  Weeks    Status  New            Plan - 07/28/17 1117    Clinical Impression Statement  Reviewed with pt both her strengthening HEP (3 way raises and supine scapular series) and progressed supine to include red theraband which she tolerated very well though she did require mod VCs for correct technique with the supine scapular series. Overall pt continues to show great progress with min to no pain now at Lt shoulder and just tightness at Rt.     Rehab Potential  Good    Clinical Impairments Affecting Rehab Potential  previous radiation     PT Frequency  2x / week    PT Duration  4 weeks    PT Treatment/Interventions  ADLs/Self Care Home Management;Iontophoresis 4mg /ml Dexamethasone;Scar mobilization;Passive range of motion;Therapeutic activities;Therapeutic exercise;Orthotic Fit/Training;Patient/family education;Manual techniques;Manual lymph drainage;Compression bandaging;Taping    PT Next Visit Plan  Cont with focus to decrease scapular compensations with A/ROM. Include P/AA/A/ROM to shoulders and manual techniques as needed for incresaed ROM to bil shoulders.     Consulted and Agree with Plan of Care  Patient       Patient will benefit from skilled therapeutic intervention in order to improve the following deficits and impairments:  Decreased knowledge of use of DME, Increased fascial restricitons, Pain, Postural dysfunction,  Increased muscle spasms, Decreased activity tolerance, Decreased range of motion, Decreased strength, Impaired UE functional use  Visit Diagnosis: Acute pain of left shoulder  Stiffness of left shoulder, not elsewhere classified  Abnormal posture  Muscle weakness (generalized)  Stiffness of right shoulder, not elsewhere classified     Problem List Patient Active Problem List   Diagnosis Date Noted  . Breast cancer of upper-outer quadrant of left female breast ([place]) 03/19/2016  . Dehydration 10/09/2014  . Essential hypertension 10/09/2014  . Chronic renal insufficiency 10/09/2014  . [place] hernia 10/02/2014    Otelia Limes, PTA 07/28/2017, 11:22 AM  [place], [place], [address] Phone: [phone]   Fax:  [phone]  Name: [name] MRN: [id number] Date of Birth: July 16, 1940

## 2017-08-03 ENCOUNTER — Ambulatory Visit: Payer: Medicare Other | Attending: Hematology and Oncology

## 2017-08-03 DIAGNOSIS — R293 Abnormal posture: Secondary | ICD-10-CM

## 2017-08-03 DIAGNOSIS — M6281 Muscle weakness (generalized): Secondary | ICD-10-CM | POA: Diagnosis present

## 2017-08-03 DIAGNOSIS — M25611 Stiffness of right shoulder, not elsewhere classified: Secondary | ICD-10-CM | POA: Diagnosis present

## 2017-08-03 DIAGNOSIS — M25612 Stiffness of left shoulder, not elsewhere classified: Secondary | ICD-10-CM

## 2017-08-03 DIAGNOSIS — M25512 Pain in left shoulder: Secondary | ICD-10-CM

## 2017-08-03 NOTE — Therapy (Signed)
[place], [place], [address] Phone: [phone]   Fax:  [phone]  Physical Therapy Treatment  Patient Details  Name: [name] MRN: [id number] Date of Birth: 24-Oct-1940 Referring Provider: Dr. [name]    Encounter Date: 08/03/2017  PT End of Session - 08/03/17 1035    Visit Number  6    Number of Visits  9    Date for PT Re-Evaluation  08/16/17    PT Start Time  0929    PT Stop Time  1024    PT Time Calculation (min)  55 min    Activity Tolerance  Patient tolerated treatment well    Behavior During Therapy  [facility] for tasks assessed/performed       Past Medical History:  Diagnosis Date  . Allergy   . Breast cancer ([place]) 01/02/2016   left breast  . Chronic renal insufficiency 10/09/2014  . Dehydration 10/09/2014  . Essential hypertension 10/09/2014  . Hypertension   . Pre-diabetes   . Wears glasses     Past Surgical History:  Procedure Laterality Date  . ABDOMINAL HYSTERECTOMY    . BREAST LUMPECTOMY WITH RADIOACTIVE SEED LOCALIZATION Left 02/27/2016   Procedure: LEFT BREAST LUMPECTOMY WITH RADIOACTIVE SEED LOCALIZATION;  Surgeon: Autumn Messing III, MD;  Location: [place];  Service: General;  Laterality: Left;  . BREAST LUMPECTOMY WITH SENTINEL LYMPH NODE BIOPSY Left 03/12/2016   Procedure: RE-EXCISION OF LEFT BREAST SUPERIOR  AND MEDIAL MARGIN  WITH SENTINEL LYMPH NODE BX;  Surgeon: Autumn Messing III, MD;  Location: [place];  Service: General;  Laterality: Left;  . CHOLECYSTECTOMY    . COLON SURGERY    . GASTROSTOMY N/A 10/04/2014   Procedure: GASTROSTOMY;  Surgeon: Autumn Messing III, MD;  [facility];  Service: General;  Laterality: N/A;  . PARTIAL COLECTOMY    . VENTRAL HERNIA REPAIR N/A 10/04/2014   Procedure: DIAPHRAGMATIC HERNIA REPAIR;  Surgeon: Autumn Messing III, MD;  [facility];  Service: General;  Laterality: N/A;    There were no vitals filed for this visit.  Subjective  Assessment - 08/03/17 0933    Subjective  I'm doing really well, felt good after last visit.     Patient Stated Goals  to help get rid of her shoulder pain     Currently in Pain?  No/denies         [facility] PT Assessment - 08/03/17 0001      AROM   Right Shoulder Flexion  118 Degrees    Right Shoulder ABduction  104 Degrees    Left Shoulder Flexion  131 Degrees    Left Shoulder ABduction  138 Degrees                  [facility] Adult PT Treatment/Exercise - 08/03/17 0001      Shoulder Exercises: Supine   Other Supine Exercises  supine scapular series with green theraband x 5 for all, required mod VCs for correct technique for all exs.      Shoulder Exercises: Standing   Other Standing Exercises  Bil 3 way raises with 1 lb, x10 each against wall with core engaged and shoulders and head against wall. Mod VCs to decrease scapular compensations, pt did much better with controlling scapular compensations today though still with some difficulty.      Shoulder Exercises: Pulleys   Flexion  2 minutes    ABduction  2 minutes      Shoulder  Exercises: Therapy Ball   Flexion  10 reps With forward lean into end of stretch; 1 lb on wrist    ABduction  10 reps Lt Ue with same side lean into end of stretch; 1 lb on wrist      Manual Therapy   Manual Therapy  Passive ROM;Scapular mobilization    Scapular Mobilization  In Lt S/L for Rt scapula into protraction, retraction, and depression; able to attain little movement with this as scapular mobility was limited. Also had pt perform AA/ROM er with tactile and VC for technique and after a few she was able to report feeling a scapular contraction. P/ROM improved in supine after all scapular mobility.    Passive ROM  In Supine to bil shoulders into flexion, abduction, D2 and er to pts tolerance                  PT Long Term Goals - 07/15/17 1805      PT LONG TERM GOAL #1   Title  Pt will report overall decreased pain in left  shoulder by 50%     Time  4    Period  Weeks    Status  New      PT LONG TERM GOAL #2   Title  Pt will have increased range of motion of both shoulders so that she can put her t shirt on and off by herself      Time  4    Period  Weeks    Status  New      PT LONG TERM GOAL #3   Title  Pt will be independent in a home exercise program for shoulder range of motion and strength     Time  4    Period  Weeks    Status  New      PT LONG TERM GOAL #4   Title  Pt will improve Quick DASH score to < 40 indicating and improvement in functional use of upper body     Baseline  52.27    Time  4    Period  Weeks    Status  New            Plan - 08/03/17 1046    Clinical Impression Statement  Overall pt did very well wtih exercises today. When measuring her A/ROM her Lt shoulder continues to do well showing progress, but her Rt A/ROM had decreased since last time measured and pt demonstrated less scapular stability with exerciss today as well. So with manual therapy added scapular mobility which she had very limited motion with but was able to get some increased movement after mobilizations and pts Rt UE P/ROM was visibly improved by end of session as well.     Rehab Potential  Good    Clinical Impairments Affecting Rehab Potential  previous radiation     PT Frequency  2x / week    PT Duration  4 weeks    PT Treatment/Interventions  ADLs/Self Care Home Management;Iontophoresis 4mg /ml Dexamethasone;Scar mobilization;Passive range of motion;Therapeutic activities;Therapeutic exercise;Orthotic Fit/Training;Patient/family education;Manual techniques;Manual lymph drainage;Compression bandaging;Taping    PT Next Visit Plan  Cont with focus to decrease scapular compensations with A/ROM, especially Rt and with scapular mobs. Include P/AA/A/ROM to shoulders and manual techniques as needed for incresaed ROM to bil shoulders and strengthening exercises.     Consulted and Agree with Plan of Care  Patient        Patient will benefit from skilled therapeutic  intervention in order to improve the following deficits and impairments:  Decreased knowledge of use of DME, Increased fascial restricitons, Pain, Postural dysfunction, Increased muscle spasms, Decreased activity tolerance, Decreased range of motion, Decreased strength, Impaired UE functional use  Visit Diagnosis: Acute pain of left shoulder  Stiffness of left shoulder, not elsewhere classified  Abnormal posture  Muscle weakness (generalized)  Stiffness of right shoulder, not elsewhere classified     Problem List Patient Active Problem List   Diagnosis Date Noted  . Breast cancer of upper-outer quadrant of left female breast ([place]) 03/19/2016  . Dehydration 10/09/2014  . Essential hypertension 10/09/2014  . Chronic renal insufficiency 10/09/2014  . [place] hernia 10/02/2014    Otelia Limes, PTA 08/03/2017, 10:50 AM  [place], [place], [address] Phone: [phone]   Fax:  [phone]  Name: [name] MRN: [id number] Date of Birth: 11/08/1940

## 2017-08-09 ENCOUNTER — Ambulatory Visit: Payer: Medicare Other

## 2017-08-09 DIAGNOSIS — M25611 Stiffness of right shoulder, not elsewhere classified: Secondary | ICD-10-CM

## 2017-08-09 DIAGNOSIS — R293 Abnormal posture: Secondary | ICD-10-CM

## 2017-08-09 DIAGNOSIS — M25612 Stiffness of left shoulder, not elsewhere classified: Secondary | ICD-10-CM

## 2017-08-09 DIAGNOSIS — M25512 Pain in left shoulder: Secondary | ICD-10-CM | POA: Diagnosis not present

## 2017-08-09 DIAGNOSIS — M6281 Muscle weakness (generalized): Secondary | ICD-10-CM

## 2017-08-09 NOTE — Therapy (Signed)
[place], [place], [address] Phone: [phone]   Fax:  [phone]  Physical Therapy Treatment  Patient Details  Name: [name] MRN: [id number] Date of Birth: 09/05/1940 Referring Provider: Dr. [name]    Encounter Date: 08/09/2017  PT End of Session - 08/09/17 1110    Visit Number  7    Number of Visits  9    Date for PT Re-Evaluation  08/16/17    PT Start Time  8563    PT Stop Time  1104    PT Time Calculation (min)  49 min    Activity Tolerance  Patient tolerated treatment well    Behavior During Therapy  [facility] for tasks assessed/performed       Past Medical History:  Diagnosis Date  . Allergy   . Breast cancer ([place]) 01/02/2016   left breast  . Chronic renal insufficiency 10/09/2014  . Dehydration 10/09/2014  . Essential hypertension 10/09/2014  . Hypertension   . Pre-diabetes   . Wears glasses     Past Surgical History:  Procedure Laterality Date  . ABDOMINAL HYSTERECTOMY    . BREAST LUMPECTOMY WITH RADIOACTIVE SEED LOCALIZATION Left 02/27/2016   Procedure: LEFT BREAST LUMPECTOMY WITH RADIOACTIVE SEED LOCALIZATION;  Surgeon: Autumn Messing III, MD;  Location: [place];  Service: General;  Laterality: Left;  . BREAST LUMPECTOMY WITH SENTINEL LYMPH NODE BIOPSY Left 03/12/2016   Procedure: RE-EXCISION OF LEFT BREAST SUPERIOR  AND MEDIAL MARGIN  WITH SENTINEL LYMPH NODE BX;  Surgeon: Autumn Messing III, MD;  Location: [place];  Service: General;  Laterality: Left;  . CHOLECYSTECTOMY    . COLON SURGERY    . GASTROSTOMY N/A 10/04/2014   Procedure: GASTROSTOMY;  Surgeon: Autumn Messing III, MD;  [facility];  Service: General;  Laterality: N/A;  . PARTIAL COLECTOMY    . VENTRAL HERNIA REPAIR N/A 10/04/2014   Procedure: DIAPHRAGMATIC HERNIA REPAIR;  Surgeon: Autumn Messing III, MD;  [facility];  Service: General;  Laterality: N/A;    There were no vitals filed for this visit.  Subjective  Assessment - 08/09/17 1019    Subjective  My Lt shoulder is doing great, I think it's back to normal. I was even able to sleep on it last night and didn't have any pain when I woke up. My Rt one is just tight.    Patient Stated Goals  to help get rid of her shoulder pain     Currently in Pain?  No/denies                      [facility] Adult PT Treatment/Exercise - 08/09/17 0001      Shoulder Exercises: Standing   Other Standing Exercises  Bil 3 way raises with 1 lb, 2x10 each against wall with core engaged and shoulders and head against wall. Min-mod VCs to decrease scapular compensations, pt did much better with controlling scapular compensations today though still with some difficulty.      Shoulder Exercises: Pulleys   Flexion  2 minutes    ABduction  2 minutes    ABduction Limitations  VCs and demonstration to remind of technique      Shoulder Exercises: Therapy Ball   Flexion  10 reps With forward lean into end of stretch; 1 lb each wrist    ABduction  10 reps Bil UE with same side lean into end of stretch; 1 lb each  ABduction Limitations  Tactile and VCs to decrease Rt scapular compensation throughout      Manual Therapy   Manual Therapy  Passive ROM;Scapular mobilization    Scapular Mobilization  In Lt S/L for Rt scapula into protraction, retraction, and depression; able to attain little movement with this as scapular mobility was limited. Also had pt perform AA/ROM er with tactile and VC for technique and after a few she was able to report feeling a scapular contraction. P/ROM improved in supine after all scapular mobility.    Passive ROM  In Supine to bil shoulders into flexion, abduction, D2 and er to pts tolerance                  PT Long Term Goals - 07/15/17 1805      PT LONG TERM GOAL #1   Title  Pt will report overall decreased pain in left shoulder by 50%     Time  4    Period  Weeks    Status  New      PT LONG TERM GOAL #2   Title  Pt  will have increased range of motion of both shoulders so that she can put her t shirt on and off by herself      Time  4    Period  Weeks    Status  New      PT LONG TERM GOAL #3   Title  Pt will be independent in a home exercise program for shoulder range of motion and strength     Time  4    Period  Weeks    Status  New      PT LONG TERM GOAL #4   Title  Pt will improve Quick DASH score to < 40 indicating and improvement in functional use of upper body     Baseline  52.27    Time  4    Period  Weeks    Status  New            Plan - 08/09/17 1110    Clinical Impression Statement  Pt continues to show improvement with Rt shoulder end ROM and she is beginning to show better control with scapula with A/ROM exercises. Pt is reporting noting improvements as well and feels she will be ready to D/C at next visit.     Rehab Potential  Good    Clinical Impairments Affecting Rehab Potential  previous radiation     PT Frequency  2x / week    PT Duration  4 weeks    PT Treatment/Interventions  ADLs/Self Care Home Management;Iontophoresis 4mg /ml Dexamethasone;Scar mobilization;Passive range of motion;Therapeutic activities;Therapeutic exercise;Orthotic Fit/Training;Patient/family education;Manual techniques;Manual lymph drainage;Compression bandaging;Taping    PT Next Visit Plan  D/C next visit. Review and finalize prn HEP and assess goals.     Consulted and Agree with Plan of Care  Patient       Patient will benefit from skilled therapeutic intervention in order to improve the following deficits and impairments:  Decreased knowledge of use of DME, Increased fascial restricitons, Pain, Postural dysfunction, Increased muscle spasms, Decreased activity tolerance, Decreased range of motion, Decreased strength, Impaired UE functional use  Visit Diagnosis: Acute pain of left shoulder  Stiffness of left shoulder, not elsewhere classified  Abnormal posture  Muscle weakness  (generalized)  Stiffness of right shoulder, not elsewhere classified     Problem List Patient Active Problem List   Diagnosis Date Noted  . Breast cancer of  upper-outer quadrant of left female breast ([place]) 03/19/2016  . Dehydration 10/09/2014  . Essential hypertension 10/09/2014  . Chronic renal insufficiency 10/09/2014  . [place] hernia 10/02/2014    Otelia Limes, PTA 08/09/2017, 11:34 AM  [place], [place], [address] Phone: [phone]   Fax:  [phone]  Name: [name] MRN: [id number] Date of Birth: 08/25/1940

## 2017-08-18 ENCOUNTER — Ambulatory Visit: Payer: Medicare Other

## 2017-08-18 DIAGNOSIS — M25512 Pain in left shoulder: Secondary | ICD-10-CM

## 2017-08-18 DIAGNOSIS — M6281 Muscle weakness (generalized): Secondary | ICD-10-CM

## 2017-08-18 DIAGNOSIS — M25611 Stiffness of right shoulder, not elsewhere classified: Secondary | ICD-10-CM

## 2017-08-18 DIAGNOSIS — M25612 Stiffness of left shoulder, not elsewhere classified: Secondary | ICD-10-CM

## 2017-08-18 DIAGNOSIS — R293 Abnormal posture: Secondary | ICD-10-CM

## 2017-08-18 NOTE — Therapy (Addendum)
[place], [place], [address] Phone: [phone]   Fax:  [phone]  Physical Therapy Treatment  Patient Details  Name: [name] MRN: [id number] Date of Birth: [birth year] Referring Provider: Dr. [name]    Encounter Date: 08/18/2017  PT End of Session - 08/18/17 1155    Visit Number  8    Number of Visits  9    Date for PT Re-Evaluation  08/16/17    PT Start Time  1107    PT Stop Time  1157    PT Time Calculation (min)  50 min    Activity Tolerance  Patient tolerated treatment well    Behavior During Therapy  [facility] for tasks assessed/performed       Past Medical History:  Diagnosis Date  . Allergy   . Breast cancer ([place]) 01/02/2016   left breast  . Chronic renal insufficiency 10/09/2014  . Dehydration 10/09/2014  . Essential hypertension 10/09/2014  . Hypertension   . Pre-diabetes   . Wears glasses     Past Surgical History:  Procedure Laterality Date  . ABDOMINAL HYSTERECTOMY    . BREAST LUMPECTOMY WITH RADIOACTIVE SEED LOCALIZATION Left 02/27/2016   Procedure: LEFT BREAST LUMPECTOMY WITH RADIOACTIVE SEED LOCALIZATION;  Surgeon: Autumn Messing III, MD;  Location: [place];  Service: General;  Laterality: Left;  . BREAST LUMPECTOMY WITH SENTINEL LYMPH NODE BIOPSY Left 03/12/2016   Procedure: RE-EXCISION OF LEFT BREAST SUPERIOR  AND MEDIAL MARGIN  WITH SENTINEL LYMPH NODE BX;  Surgeon: Autumn Messing III, MD;  Location: [place];  Service: General;  Laterality: Left;  . CHOLECYSTECTOMY    . COLON SURGERY    . GASTROSTOMY N/A 10/04/2014   Procedure: GASTROSTOMY;  Surgeon: Autumn Messing III, MD;  Location: [facility];  Service: General;  Laterality: N/A;  . PARTIAL COLECTOMY    . VENTRAL HERNIA REPAIR N/A 10/04/2014   Procedure: DIAPHRAGMATIC HERNIA REPAIR;  Surgeon: Autumn Messing III, MD;  Location: [facility];  Service: General;  Laterality: N/A;    There were no vitals filed for this visit.  Subjective  Assessment - 08/18/17 1111    Subjective  I've been doing good over this past week. I think I'm ready to make this my last visit.      Patient Stated Goals  to help get rid of her shoulder pain     Currently in Pain?  No/denies         [facility] PT Assessment - 08/18/17 0001      AROM   Right Shoulder Flexion  126 Degrees    Right Shoulder ABduction  113 Degrees    Left Shoulder Flexion  133 Degrees    Left Shoulder ABduction  138 Degrees           Quick Dash - 08/18/17 0001    Open a tight or new jar  No difficulty    Do heavy household chores (wash walls, wash floors)  No difficulty    Carry a shopping bag or briefcase  No difficulty    Wash your back  Mild difficulty    Use a knife to cut food  No difficulty    Recreational activities in which you take some force or impact through your arm, shoulder, or hand (golf, hammering, tennis)  Mild difficulty    During the past week, to what extent has your arm, shoulder or hand problem interfered with your normal social activities with family, friends, neighbors, or groups?  Not at all    During the past week, to what extent has your arm, shoulder or hand problem limited your work or other regular daily activities  Not at all    Arm, shoulder, or hand pain.  None    Tingling (pins and needles) in your arm, shoulder, or hand  None    Difficulty Sleeping  No difficulty    DASH Score  4.55 %            [facility] Adult PT Treatment/Exercise - 08/18/17 0001      Shoulder Exercises: Standing   Other Standing Exercises  Bil 3 way raises with 2 lb, 2x10 each (except 1 lb for abduction) against wall with core engaged and shoulders and head against wall. Min-mod VCs to decrease scapular compensations, pt did much better with controlling scapular compensations today though still with some difficulty.      Shoulder Exercises: Pulleys   Flexion  2 minutes    Flexion Limitations  VCs to remind pt to decrease scapular compensations.     ABduction  2 minutes    ABduction Limitations  VCs to decrease side trunk lean.      Shoulder Exercises: Therapy Ball   Flexion  10 reps Forward lean into end of stretch, 1 lb each wrist    Flexion Limitations  Pt able to go higher today and with much less scapular compensations    ABduction  10 reps Bil UE with same side lena into end of stretch, 1 lb wrists      Manual Therapy   Manual Therapy  Passive ROM;Scapular mobilization    Passive ROM  In Supine to bil shoulders into flexion, abduction, D2 and er to pts tolerance                  PT Long Term Goals - 08/18/17 1129      PT LONG TERM GOAL #1   Title  Pt will report overall decreased pain in left shoulder by 50%     Baseline  100%, pt reports no more pain-08/18/17    Status  Achieved      PT LONG TERM GOAL #2   Title  Pt will have increased range of motion of both shoulders so that she can put her t shirt on and off by herself      Baseline  Pt reports being able to do this for about 2-3 weeks now-08/18/17    Status  Achieved      PT LONG TERM GOAL #3   Title  Pt will be independent in a home exercise program for shoulder range of motion and strength     Status  Achieved      PT LONG TERM GOAL #4   Title  Pt will improve Quick DASH score to < 40 indicating and improvement in functional use of upper body     Baseline  52.27; 4.5 - 08/18/17    Status  Achieved            Plan - 08/18/17 1156    Clinical Impression Statement  Pt has done very well and met all goals at this time. She is ready for D/C.     Rehab Potential  Good    Clinical Impairments Affecting Rehab Potential  previous radiation     PT Frequency  2x / week    PT Duration  4 weeks    PT Treatment/Interventions  ADLs/Self Care Home Management;Iontophoresis 57m/ml Dexamethasone;Scar mobilization;Passive range  of motion;Therapeutic activities;Therapeutic exercise;Orthotic Fit/Training;Patient/family education;Manual techniques;Manual lymph  drainage;Compression bandaging;Taping    PT Next Visit Plan  D/C this visit.     Consulted and Agree with Plan of Care  Patient       Patient will benefit from skilled therapeutic intervention in order to improve the following deficits and impairments:  Decreased knowledge of use of DME, Increased fascial restricitons, Pain, Postural dysfunction, Increased muscle spasms, Decreased activity tolerance, Decreased range of motion, Decreased strength, Impaired UE functional use  Visit Diagnosis: Acute pain of left shoulder  Stiffness of left shoulder, not elsewhere classified  Abnormal posture  Muscle weakness (generalized)  Stiffness of right shoulder, not elsewhere classified     Problem List Patient Active Problem List   Diagnosis Date Noted  . Breast cancer of upper-outer quadrant of left female breast ([place]) 03/19/2016  . Dehydration 10/09/2014  . Essential hypertension 10/09/2014  . Chronic renal insufficiency 10/09/2014  . [place] hernia 10/02/2014    Otelia Limes, PTA 08/18/2017, 12:03 PM  [place], [place], [address] Phone: [phone]   Fax:  [phone]  Name: Briya Lookabaugh MRN: [id number] Date of Birth: Mar 14, 1941  PHYSICAL THERAPY DISCHARGE SUMMARY  Visits from Start of Care: 8   Current functional level related to goals / functional outcomes: Pt reports functional improvmemt though she still has limitations in bilateral shoulder flexion    Remaining deficits: As above    Education / Equipment: Home exercise program  Plan: Patient agrees to discharge.  Patient goals were met. Patient is being discharged due to being pleased with the current functional level.  ?????    Donato Heinz. Owens Shark, PT

## 2017-11-04 ENCOUNTER — Encounter: Payer: Self-pay | Admitting: Hematology and Oncology

## 2017-11-11 ENCOUNTER — Telehealth: Payer: Self-pay | Admitting: Hematology and Oncology

## 2017-11-11 NOTE — Telephone Encounter (Signed)
Scheduled appt per 5/13 sch message =-pt is aware of appt date and time   

## 2017-11-18 ENCOUNTER — Telehealth: Payer: Self-pay | Admitting: Hematology and Oncology

## 2017-11-18 ENCOUNTER — Inpatient Hospital Stay: Payer: Medicare Other | Attending: Hematology and Oncology | Admitting: Hematology and Oncology

## 2017-11-18 DIAGNOSIS — C50412 Malignant neoplasm of upper-outer quadrant of left female breast: Secondary | ICD-10-CM | POA: Diagnosis present

## 2017-11-18 DIAGNOSIS — Z170 Estrogen receptor positive status [ER+]: Secondary | ICD-10-CM | POA: Diagnosis not present

## 2017-11-18 DIAGNOSIS — Z79811 Long term (current) use of aromatase inhibitors: Secondary | ICD-10-CM | POA: Diagnosis not present

## 2017-11-18 DIAGNOSIS — N898 Other specified noninflammatory disorders of vagina: Secondary | ICD-10-CM | POA: Diagnosis not present

## 2017-11-18 DIAGNOSIS — M791 Myalgia, unspecified site: Secondary | ICD-10-CM | POA: Insufficient documentation

## 2017-11-18 NOTE — Assessment & Plan Note (Signed)
Left lumpectomy08/31/2017:IDC with papillary features, grade 2, 1.1 cm, ADH, superior/medial margin positive, ER 95%, PR 95%, Ki-67 5%, HER-2 negative ratio 1.13,  Margin re-excision 03/12/2016: Negative for cancer, 0/2 lymph nodes negative Pathologic staging: T1c N0 stage IA Oncotype Dx score 0: 2% ROR Adj XRT 04/21/16 to 05/15/16  Treatment Plan: Adjuvant therapy with Anastrozole 1 mg daily X 5 years started 06/29/2016  Anastrozole toxicities: 1.Hot flashes resolved since she started taking anastrozole at bedtime 2.muscle stiffness  3.  Vaginal dryness causing difficulty with intercourse: We provided her information and literature and products for lubrication.  RTC in 1 year

## 2017-11-18 NOTE — Telephone Encounter (Signed)
Gave patient AVs and calendar of upcoming December appointments.

## 2017-11-18 NOTE — Progress Notes (Signed)
Patient Care Team: Leighton Ruff, MD as PCP - General (Family Medicine) Nicholas Lose, MD as Consulting Physician (Hematology and Oncology) Delice Bison, Charlestine Massed, NP as Nurse Practitioner (Hematology and Oncology) Kyung Rudd, MD as Consulting Physician (Radiation Oncology) [name], MD as Consulting Physician (General Surgery)  DIAGNOSIS:  Encounter Diagnosis  Name Primary?  . Malignant neoplasm of upper-outer quadrant of left breast in female, estrogen receptor positive ([place])     SUMMARY OF ONCOLOGIC HISTORY:   Breast cancer of upper-outer quadrant of left female breast ([place])   01/02/2016 Initial Biopsy    Left breast biopsy 1:00: Ductal papilloma with [facility]      02/27/2016 Surgery    Left lumpectomy: IDC with papillary features, grade 2, 1.1 cm, ADH, superior/medial margin positive, ER 95%, PR 95%, Ki-67 5%, HER-2 negative ratio 1.13, T1c N0 stage IA      03/12/2016 Surgery    Left superior and medial margin reexcision: Negative for malignancy, 0/2 lymph nodes; Oncotype Dx 0 (2% ROR)       04/21/2016 - 05/18/2016 Radiation Therapy    Adj XRT Lisbeth Renshaw): The patient initially received a dose of 42.5 Gy in 17 fractions to the breast using whole-breast tangent fields. This was delivered using a 3-D conformal technique. The patient then received a boost to the seroma. This delivered an additional 7.5 Gy in 3 fractions using a 3 field photon technique due to the depth of the seroma. The total dose was 50 Gy.      06/29/2016 -  Anti-estrogen oral therapy    Anastrozole 1 mg daily       CHIEF COMPLIANT: Follow-up on anastrozole therapy  INTERVAL HISTORY: [name] is a 77 year old with above-mentioned history of left breast cancer treated with lumpectomy radiation is currently on adjuvant antiestrogen therapy with anastrozole.  She has been on it for the past year and half.  She reports hot flashes have improved.  The muscle stiffness continues to linger on inspection of  the left shoulder.  She continues to have some vaginal dryness issues.  Denies any lumps or nodules in the breast.  She is complaining of swelling of the left hand.  The swelling which was worse last week has resolved significantly.  She also complains of muscle aches and pains and stiffness in her lower extremities.  REVIEW OF SYSTEMS:   Constitutional: Denies fevers, chills or abnormal weight loss Eyes: Denies blurriness of vision Ears, nose, mouth, throat, and face: Denies mucositis or sore throat Respiratory: Denies cough, dyspnea or wheezes Cardiovascular: Denies palpitation, chest discomfort Gastrointestinal:  Denies nausea, heartburn or change in bowel habits Skin: Denies abnormal skin rashes Lymphatics: Denies new lymphadenopathy or easy bruising Neurological:Denies numbness, tingling or new weaknesses Behavioral/Psych: Mood is stable, no new changes  Extremities: Muscle aches and stiffness, left arm swelling Breast:  denies any pain or lumps or nodules in either breasts All other systems were reviewed with the patient and are negative.  I have reviewed the past medical history, past surgical history, social history and family history with the patient and they are unchanged from previous note.  ALLERGIES:  is allergic to hydrocodone-acetaminophen.  MEDICATIONS:  Current Outpatient Medications  Medication Sig Dispense Refill  . acetaminophen (TYLENOL) 500 MG tablet Take 500 mg by mouth daily as needed for headache.    Marland Kitchen amLODipine (NORVASC) 5 MG tablet Take 5 mg by mouth daily.    Marland Kitchen anastrozole (ARIMIDEX) 1 MG tablet Take 1 tablet (1 mg total) by mouth  daily. 90 tablet 3  . aspirin 81 MG tablet Take 81 mg by mouth daily.    . Cholecalciferol (VITAMIN D3) 2000 units capsule Take 2,000 Units by mouth daily.     . furosemide (LASIX) 40 MG tablet Take 40 mg by mouth as needed. Swelling    . Multiple Vitamin (MULTIVITAMIN WITH MINERALS) TABS tablet Take 1 tablet by mouth daily.    Marland Kitchen  tiZANidine (ZANAFLEX) 2 MG tablet TAKE 1 TABLET BY MOUTH 3 TIMES A DAY AS NEEDED FOR MUSCLE SPASMS  0   No current facility-administered medications for this visit.     PHYSICAL EXAMINATION: ECOG PERFORMANCE STATUS: 1 - Symptomatic but completely ambulatory  Vitals:   11/18/17 1408  BP: (!) 169/85  Pulse: 72  Resp: 17  Temp: 97.8 F (36.6 C)  SpO2: 98%   Filed Weights   11/18/17 1408  Weight: 196 lb 8 oz (89.1 kg)    GENERAL:alert, no distress and comfortable SKIN: skin color, texture, turgor are normal, no rashes or significant lesions EYES: normal, Conjunctiva are pink and non-injected, sclera clear OROPHARYNX:no exudate, no erythema and lips, buccal mucosa, and tongue normal  NECK: supple, thyroid normal size, non-tender, without nodularity LYMPH:  no palpable lymphadenopathy in the cervical, axillary or inguinal LUNGS: clear to auscultation and percussion with normal breathing effort HEART: regular rate & rhythm and no murmurs and no lower extremity edema ABDOMEN:abdomen soft, non-tender and normal bowel sounds MUSCULOSKELETAL:no cyanosis of digits and no clubbing  NEURO: alert & oriented x 3 with fluent speech, no focal motor/sensory deficits EXTREMITIES: No lower extremity edema BREAST: No palpable masses or nodules in either right or left breasts. No palpable axillary supraclavicular or infraclavicular adenopathy no breast tenderness or nipple discharge. (exam performed in the presence of a chaperone)  LABORATORY DATA:  I have reviewed the data as listed CMP Latest Ref Rng & Units 02/20/2016 10/06/2014 10/03/2014  Glucose 65 - 99 mg/dL 98 116(H) 116(H)  BUN 6 - 20 mg/dL '17 9 17  ' Creatinine 0.44 - 1.00 mg/dL 1.41(H) 1.02 1.18(H)  Sodium 135 - 145 mmol/L 139 140 148(H)  Potassium 3.5 - 5.1 mmol/L 3.8 3.5 4.0  Chloride 101 - 111 mmol/L 105 113(H) 113(H)  CO2 22 - 32 mmol/L '27 23 29  ' Calcium 8.9 - 10.3 mg/dL 9.1 8.2(L) 8.8  Total Protein 6.0 - 8.3 g/dL - - -  Total  Bilirubin 0.3 - 1.2 mg/dL - - -  Alkaline Phos 39 - 117 U/L - - -  AST 0 - 37 U/L - - -  ALT 0 - 35 U/L - - -    Lab Results  Component Value Date   WBC 6.4 02/20/2016   HGB 13.2 02/20/2016   HCT 41.1 02/20/2016   MCV 88.0 02/20/2016   PLT 356 02/20/2016   NEUTROABS 8.9 (H) 10/02/2014    ASSESSMENT & PLAN:  Breast cancer of upper-outer quadrant of left female breast ([place]) Left lumpectomy08/31/2017:IDC with papillary features, grade 2, 1.1 cm, ADH, superior/medial margin positive, ER 95%, PR 95%, Ki-67 5%, HER-2 negative ratio 1.13,  Margin re-excision 03/12/2016: Negative for cancer, 0/2 lymph nodes negative Pathologic staging: T1c N0 stage IA Oncotype Dx score 0: 2% ROR Adj XRT 04/21/16 to 05/15/16  Treatment Plan: Adjuvant therapy with Anastrozole 1 mg daily X 5 years started 06/29/2016  Anastrozole toxicities: 1.Hot flashes resolved since she started taking anastrozole at bedtime 2.muscle stiffness: Patient is having increasing stiffness and achiness.  Is worse in the morning.  I discussed with her that this could be side effects to anastrozole therapy she will take anastrozole in the morning and see if she tolerates it any better.  The other option is to split the anastrozole and to half a tablets and then take half in the morning and half at night. 3.  Vaginal dryness causing difficulty with intercourse: We provided her information and literature and products for lubrication. 4.  I discussed with her that if she has a swelling again then we will have to do an ultrasound of the arm.  Left arm swelling: Could be mild lymphedema.  It appears that the swelling has subsided significantly.  If there is no blood clot then we may consider referring her back to physical therapy or prescribing a lymphedema sleeve.    Mammograms have been ordered for June 2019 RTC in December for follow-up      No orders of the defined types were placed in this encounter.  The patient has a  good understanding of the overall plan. she agrees with it. she will call with any problems that may develop before the next visit here.   Harriette Ohara, MD 11/18/17

## 2018-01-06 ENCOUNTER — Ambulatory Visit
Admission: RE | Admit: 2018-01-06 | Discharge: 2018-01-06 | Disposition: A | Discharge status: Home / Self Care | Payer: Medicare Other | Source: Ambulatory Visit | Attending: Hematology and Oncology | Admitting: Hematology and Oncology

## 2018-01-06 DIAGNOSIS — C50412 Malignant neoplasm of upper-outer quadrant of left female breast: Secondary | ICD-10-CM

## 2018-01-06 DIAGNOSIS — Z170 Estrogen receptor positive status [ER+]: Principal | ICD-10-CM

## 2018-02-17 ENCOUNTER — Other Ambulatory Visit: Payer: Self-pay | Admitting: Nephrology

## 2018-02-17 DIAGNOSIS — N183 Chronic kidney disease, stage 3 unspecified: Secondary | ICD-10-CM

## 2018-02-17 DIAGNOSIS — I129 Hypertensive chronic kidney disease with stage 1 through stage 4 chronic kidney disease, or unspecified chronic kidney disease: Secondary | ICD-10-CM

## 2018-02-17 DIAGNOSIS — E559 Vitamin D deficiency, unspecified: Secondary | ICD-10-CM

## 2018-02-24 ENCOUNTER — Ambulatory Visit
Admission: RE | Admit: 2018-02-24 | Discharge: 2018-02-24 | Disposition: A | Discharge status: Home / Self Care | Payer: Medicare Other | Source: Ambulatory Visit | Attending: Nephrology | Admitting: Nephrology

## 2018-02-24 DIAGNOSIS — N183 Chronic kidney disease, stage 3 unspecified: Secondary | ICD-10-CM

## 2018-02-24 DIAGNOSIS — I129 Hypertensive chronic kidney disease with stage 1 through stage 4 chronic kidney disease, or unspecified chronic kidney disease: Secondary | ICD-10-CM

## 2018-02-24 DIAGNOSIS — E559 Vitamin D deficiency, unspecified: Secondary | ICD-10-CM

## 2018-05-16 ENCOUNTER — Telehealth: Payer: Self-pay | Admitting: Hematology and Oncology

## 2018-05-16 NOTE — Telephone Encounter (Signed)
[facility] PAL 12/12 moved f/u to 12/20. Left message for patient. Schedule mailed.

## 2018-06-09 ENCOUNTER — Ambulatory Visit: Payer: Medicare Other | Admitting: Hematology and Oncology

## 2018-06-16 NOTE — Assessment & Plan Note (Signed)
Left lumpectomy08/31/2017:IDC with papillary features, grade 2, 1.1 cm, ADH, superior/medial margin positive, ER 95%, PR 95%, Ki-67 5%, HER-2 negative ratio 1.13,  Margin re-excision 03/12/2016: Negative for cancer, 0/2 lymph nodes negative Pathologic staging: T1c N0 stage IA Oncotype Dx score 0: 2% ROR Adj XRT 04/21/16 to 05/15/16  Treatment Plan: Adjuvant therapy with Anastrozole 1 mg daily X 5 years started 06/29/2016  Anastrozole toxicities: 1.Hot flashesresolved since she started taking anastrozole at bedtime 2.muscle stiffness 3. Vaginal dryness  Breast Cancer Surveillance: 1. Mammogram 01/06/18: Benign, density B 2. Breast Exam: Benign

## 2018-06-17 ENCOUNTER — Inpatient Hospital Stay: Payer: Medicare Other | Attending: Hematology and Oncology | Admitting: Hematology and Oncology

## 2018-06-17 DIAGNOSIS — Z7982 Long term (current) use of aspirin: Secondary | ICD-10-CM | POA: Diagnosis not present

## 2018-06-17 DIAGNOSIS — Z79811 Long term (current) use of aromatase inhibitors: Secondary | ICD-10-CM | POA: Insufficient documentation

## 2018-06-17 DIAGNOSIS — Z170 Estrogen receptor positive status [ER+]: Secondary | ICD-10-CM | POA: Diagnosis not present

## 2018-06-17 DIAGNOSIS — Z923 Personal history of irradiation: Secondary | ICD-10-CM | POA: Diagnosis not present

## 2018-06-17 DIAGNOSIS — C50412 Malignant neoplasm of upper-outer quadrant of left female breast: Secondary | ICD-10-CM | POA: Diagnosis present

## 2018-06-17 DIAGNOSIS — Z79899 Other long term (current) drug therapy: Secondary | ICD-10-CM

## 2018-06-17 MED ORDER — ANASTROZOLE 1 MG PO TABS: 1.0000 mg | ORAL_TABLET | Freq: Every day | ORAL | 3 refills | Status: DC

## 2018-06-17 MED ORDER — AMLODIPINE BESYLATE 10 MG PO TABS: 10.0000 mg | ORAL_TABLET | Freq: Every day | ORAL | Status: AC

## 2018-06-17 NOTE — Progress Notes (Signed)
Patient Care Team: Leighton Ruff, MD as PCP - General (Family Medicine) Nicholas Lose, MD as Consulting Physician (Hematology and Oncology) Delice Bison, Charlestine Massed, NP as Nurse Practitioner (Hematology and Oncology) Kyung Rudd, MD as Consulting Physician (Radiation Oncology) [name], MD as Consulting Physician (General Surgery)  DIAGNOSIS:  Encounter Diagnosis  Name Primary?  . Malignant neoplasm of upper-outer quadrant of left breast in female, estrogen receptor positive ([place])     SUMMARY OF ONCOLOGIC HISTORY:   Breast cancer of upper-outer quadrant of left female breast ([place])   01/02/2016 Initial Biopsy    Left breast biopsy 1:00: Ductal papilloma with [facility]    02/27/2016 Surgery    Left lumpectomy: IDC with papillary features, grade 2, 1.1 cm, ADH, superior/medial margin positive, ER 95%, PR 95%, Ki-67 5%, HER-2 negative ratio 1.13, T1c N0 stage IA    03/12/2016 Surgery    Left superior and medial margin reexcision: Negative for malignancy, 0/2 lymph nodes; Oncotype Dx 0 (2% ROR)     04/21/2016 - 05/18/2016 Radiation Therapy    Adj XRT Lisbeth Renshaw): The patient initially received a dose of 42.5 Gy in 17 fractions to the breast using whole-breast tangent fields. This was delivered using a 3-D conformal technique. The patient then received a boost to the seroma. This delivered an additional 7.5 Gy in 3 fractions using a 3 field photon technique due to the depth of the seroma. The total dose was 50 Gy.    06/29/2016 -  Anti-estrogen oral therapy    Anastrozole 1 mg daily     CHIEF COMPLIANT: Follow-up on anastrozole therapy  INTERVAL HISTORY: [name] is a 77 year old with above-mentioned history of left breast cancer treated with lumpectomy radiation is currently on hormone therapy with anastrozole.  She is tolerating anastrozole fairly well.  Hot flashes are went away.  She does have occasional aches and pains.  She has arthritis in the right shoulder.  Vaginal dryness is  also improved.  REVIEW OF SYSTEMS:   Constitutional: Denies fevers, chills or abnormal weight loss Eyes: Denies blurriness of vision Ears, nose, mouth, throat, and face: Denies mucositis or sore throat Respiratory: Denies cough, dyspnea or wheezes Cardiovascular: Denies palpitation, chest discomfort Gastrointestinal:  Denies nausea, heartburn or change in bowel habits Skin: Denies abnormal skin rashes Lymphatics: Denies new lymphadenopathy or easy bruising Neurological:Denies numbness, tingling or new weaknesses Behavioral/Psych: Mood is stable, no new changes  Extremities: No lower extremity edema Breast:  denies any pain or lumps or nodules in either breasts All other systems were reviewed with the patient and are negative.  I have reviewed the past medical history, past surgical history, social history and family history with the patient and they are unchanged from previous note.  ALLERGIES:  is allergic to hydrocodone-acetaminophen.  MEDICATIONS:  Current Outpatient Medications  Medication Sig Dispense Refill  . acetaminophen (TYLENOL) 500 MG tablet Take 500 mg by mouth daily as needed for headache.    Marland Kitchen amLODipine (NORVASC) 10 MG tablet Take 1 tablet (10 mg total) by mouth daily.    Marland Kitchen anastrozole (ARIMIDEX) 1 MG tablet Take 1 tablet (1 mg total) by mouth daily. 90 tablet 3  . aspirin 81 MG tablet Take 81 mg by mouth daily.    . Cholecalciferol (VITAMIN D3) 2000 units capsule Take 2,000 Units by mouth daily.     . furosemide (LASIX) 40 MG tablet Take 40 mg by mouth as needed. Swelling    . Multiple Vitamin (MULTIVITAMIN WITH MINERALS) TABS tablet Take  1 tablet by mouth daily.    Marland Kitchen tiZANidine (ZANAFLEX) 2 MG tablet TAKE 1 TABLET BY MOUTH 3 TIMES A DAY AS NEEDED FOR MUSCLE SPASMS  0   No current facility-administered medications for this visit.     PHYSICAL EXAMINATION: ECOG PERFORMANCE STATUS: 1 - Symptomatic but completely ambulatory  Vitals:   06/17/18 0902  BP: (!)  147/85  Pulse: 65  Resp: 17  Temp: 97.8 F (36.6 C)  SpO2: 100%   Filed Weights   06/17/18 0902  Weight: 190 lb 6.4 oz (86.4 kg)    GENERAL:alert, no distress and comfortable SKIN: skin color, texture, turgor are normal, no rashes or significant lesions EYES: normal, Conjunctiva are pink and non-injected, sclera clear OROPHARYNX:no exudate, no erythema and lips, buccal mucosa, and tongue normal  NECK: supple, thyroid normal size, non-tender, without nodularity LYMPH:  no palpable lymphadenopathy in the cervical, axillary or inguinal LUNGS: clear to auscultation and percussion with normal breathing effort HEART: regular rate & rhythm and no murmurs and no lower extremity edema ABDOMEN:abdomen soft, non-tender and normal bowel sounds MUSCULOSKELETAL:no cyanosis of digits and no clubbing  NEURO: alert & oriented x 3 with fluent speech, no focal motor/sensory deficits EXTREMITIES: No lower extremity edema BREAST: No palpable masses or nodules in either right or left breasts. No palpable axillary supraclavicular or infraclavicular adenopathy no breast tenderness or nipple discharge. (exam performed in the presence of a chaperone)  LABORATORY DATA:  I have reviewed the data as listed CMP Latest Ref Rng & Units 02/20/2016 10/06/2014 10/03/2014  Glucose 65 - 99 mg/dL 98 116(H) 116(H)  BUN 6 - 20 mg/dL '17 9 17  ' Creatinine 0.44 - 1.00 mg/dL 1.41(H) 1.02 1.18(H)  Sodium 135 - 145 mmol/L 139 140 148(H)  Potassium 3.5 - 5.1 mmol/L 3.8 3.5 4.0  Chloride 101 - 111 mmol/L 105 113(H) 113(H)  CO2 22 - 32 mmol/L '27 23 29  ' Calcium 8.9 - 10.3 mg/dL 9.1 8.2(L) 8.8  Total Protein 6.0 - 8.3 g/dL - - -  Total Bilirubin 0.3 - 1.2 mg/dL - - -  Alkaline Phos 39 - 117 U/L - - -  AST 0 - 37 U/L - - -  ALT 0 - 35 U/L - - -    Lab Results  Component Value Date   WBC 6.4 02/20/2016   HGB 13.2 02/20/2016   HCT 41.1 02/20/2016   MCV 88.0 02/20/2016   PLT 356 02/20/2016   NEUTROABS 8.9 (H) 10/02/2014     ASSESSMENT & PLAN:  Breast cancer of upper-outer quadrant of left female breast ([place]) Left lumpectomy08/31/2017:IDC with papillary features, grade 2, 1.1 cm, ADH, superior/medial margin positive, ER 95%, PR 95%, Ki-67 5%, HER-2 negative ratio 1.13,  Margin re-excision 03/12/2016: Negative for cancer, 0/2 lymph nodes negative Pathologic staging: T1c N0 stage IA Oncotype Dx score 0: 2% ROR Adj XRT 04/21/16 to 05/15/16  Treatment Plan: Adjuvant therapy with Anastrozole 1 mg daily X 5 years started 06/29/2016  Anastrozole toxicities: 1.Hot flashesresolved since she started taking anastrozole at bedtime 2.muscle stiffness 3. Vaginal dryness  Breast Cancer Surveillance: 1. Mammogram 01/06/18: Benign, density B 2. Breast Exam: Benign    No orders of the defined types were placed in this encounter.  The patient has a good understanding of the overall plan. she agrees with it. she will call with any problems that may develop before the next visit here.   Harriette Ohara, MD 06/17/18

## 2018-11-23 ENCOUNTER — Other Ambulatory Visit: Payer: Self-pay | Admitting: Hematology and Oncology

## 2018-11-23 DIAGNOSIS — Z853 Personal history of malignant neoplasm of breast: Secondary | ICD-10-CM

## 2019-01-09 ENCOUNTER — Ambulatory Visit
Admission: RE | Admit: 2019-01-09 | Discharge: 2019-01-09 | Disposition: A | Discharge status: Home / Self Care | Payer: Medicare Other | Source: Ambulatory Visit | Attending: Hematology and Oncology | Admitting: Hematology and Oncology

## 2019-01-09 ENCOUNTER — Other Ambulatory Visit: Payer: Self-pay | Admitting: Hematology and Oncology

## 2019-01-09 DIAGNOSIS — R921 Mammographic calcification found on diagnostic imaging of breast: Secondary | ICD-10-CM

## 2019-01-09 DIAGNOSIS — Z853 Personal history of malignant neoplasm of breast: Secondary | ICD-10-CM

## 2019-01-09 HISTORY — DX: Personal history of irradiation: Z92.3

## 2019-06-22 ENCOUNTER — Inpatient Hospital Stay: Payer: Medicare Other | Attending: Hematology and Oncology | Admitting: Hematology and Oncology

## 2019-06-22 NOTE — Assessment & Plan Note (Deleted)
Left lumpectomy08/31/2017:IDC with papillary features, grade 2, 1.1 cm, ADH, superior/medial margin positive, ER 95%, PR 95%, Ki-67 5%, HER-2 negative ratio 1.13,  Margin re-excision 03/12/2016: Negative for cancer, 0/2 lymph nodes negative Pathologic staging: T1c N0 stage IA Oncotype Dx score 0: 2% ROR Adj XRT 04/21/16 to 05/15/16  Treatment Plan: Adjuvant therapy with Anastrozole 1 mg daily X 5 years started 06/29/2016  Anastrozole toxicities: 1.Hot flashesresolved since she started taking anastrozole at bedtime 2.muscle stiffness 3. Vaginal dryness  Breast Cancer Surveillance: 1. Mammogram 01/06/18: Benign, density B 2. Breast Exam: 06/22/2019 benign

## 2019-07-13 ENCOUNTER — Other Ambulatory Visit: Payer: Self-pay

## 2019-07-13 ENCOUNTER — Other Ambulatory Visit: Payer: Self-pay | Admitting: Hematology and Oncology

## 2019-07-13 ENCOUNTER — Ambulatory Visit
Admission: RE | Admit: 2019-07-13 | Discharge: 2019-07-13 | Disposition: A | Discharge status: Home / Self Care | Payer: Medicare PPO | Source: Ambulatory Visit | Attending: Hematology and Oncology | Admitting: Hematology and Oncology

## 2019-07-13 DIAGNOSIS — Z853 Personal history of malignant neoplasm of breast: Secondary | ICD-10-CM

## 2019-07-13 DIAGNOSIS — R921 Mammographic calcification found on diagnostic imaging of breast: Secondary | ICD-10-CM

## 2019-07-23 ENCOUNTER — Ambulatory Visit: Payer: Medicare PPO | Attending: Internal Medicine

## 2019-07-23 DIAGNOSIS — Z23 Encounter for immunization: Secondary | ICD-10-CM | POA: Insufficient documentation

## 2019-07-24 NOTE — Progress Notes (Signed)
   Covid-19 Vaccination Clinic  Name:  [name]    MRN: [id number] DOB: [birth year]  07/23/2019  [name] was observed post Covid-19 immunization for 15 minutes without incidence. She was provided with Vaccine Information Sheet and instruction to access the V-Safe system.   [name] was instructed to call 911 with any severe reactions post vaccine: Marland Kitchen Difficulty breathing  . Swelling of your face and throat  . A fast heartbeat  . A bad rash all over your body  . Dizziness and weakness    Immunizations Administered    Name Date Dose VIS Date Route   Moderna COVID-19 Vaccine 07/23/2019  2:27 PM 0.5 mL 05/30/2019 Intramuscular   Manufacturer: Levan Hurst   Lot: [id number]   [id number]      Documented on behalf of: C. Jerline Pain

## 2019-08-20 ENCOUNTER — Ambulatory Visit: Payer: Medicare PPO | Attending: Internal Medicine

## 2019-08-20 DIAGNOSIS — Z23 Encounter for immunization: Secondary | ICD-10-CM | POA: Insufficient documentation

## 2019-08-20 NOTE — Progress Notes (Signed)
   Covid-19 Vaccination Clinic  Name:  [name]    MRN: [id number] DOB: 1941/05/08  08/20/2019  [name] was observed post Covid-19 immunization for 15 minutes without incidence. She was provided with Vaccine Information Sheet and instruction to access the V-Safe system.   [name] was instructed to call 911 with any severe reactions post vaccine: Marland Kitchen Difficulty breathing  . Swelling of your face and throat  . A fast heartbeat  . A bad rash all over your body  . Dizziness and weakness    Immunizations Administered    Name Date Dose VIS Date Route   Moderna COVID-19 Vaccine 08/20/2019  1:46 PM 0.5 mL 05/30/2019 Intramuscular   Manufacturer: Moderna   Lot: [id number]   [id number]

## 2019-08-28 ENCOUNTER — Telehealth: Payer: Self-pay | Admitting: Hematology and Oncology

## 2019-08-28 NOTE — Telephone Encounter (Signed)
Scheduled appt per 3/1 sch msg. Pt is aware of appt date and time.

## 2019-11-19 NOTE — Progress Notes (Signed)
Patient Care Team: Leighton Ruff, MD as PCP - General (Family Medicine) Nicholas Lose, MD as Consulting Physician (Hematology and Oncology) Delice Bison, Charlestine Massed, NP as Nurse Practitioner (Hematology and Oncology) Kyung Rudd, MD as Consulting Physician (Radiation Oncology) [name], MD as Consulting Physician (General Surgery)  DIAGNOSIS:    ICD-10-CM   1. Malignant neoplasm of upper-outer quadrant of left breast in female, estrogen receptor positive ([place])  C50.412    Z17.0     SUMMARY OF ONCOLOGIC HISTORY: Oncology History  Breast cancer of upper-outer quadrant of left female breast ([place])  01/02/2016 Initial Biopsy   Left breast biopsy 1:00: Ductal papilloma with [facility]   02/27/2016 Surgery   Left lumpectomy: IDC with papillary features, grade 2, 1.1 cm, ADH, superior/medial margin positive, ER 95%, PR 95%, Ki-67 5%, HER-2 negative ratio 1.13, T1c N0 stage IA   03/12/2016 Surgery   Left superior and medial margin reexcision: Negative for malignancy, 0/2 lymph nodes; Oncotype Dx 0 (2% ROR)    04/21/2016 - 05/18/2016 Radiation Therapy   Adj XRT Lisbeth Renshaw): The patient initially received a dose of 42.5 Gy in 17 fractions to the breast using whole-breast tangent fields. This was delivered using a 3-D conformal technique. The patient then received a boost to the seroma. This delivered an additional 7.5 Gy in 3 fractions using a 3 field photon technique due to the depth of the seroma. The total dose was 50 Gy.   06/29/2016 -  Anti-estrogen oral therapy   Anastrozole 1 mg daily     CHIEF COMPLIANT: Follow-up of left breast cancer on anastrozole   INTERVAL HISTORY: [name] is a 79 y.o. with above-mentioned history of left breast cancer treated with lumpectomy, radiation, and who is currently on antiestrogen therapy with anastrozole. Mammogram on 07/13/19 showed probably benign calcifications in the lumpectomy bed in the left breast recommended for 35-monthfollow-up. She presents  to the clinic today for follow-up.   ALLERGIES:  is allergic to hydrocodone-acetaminophen.  MEDICATIONS:  Current Outpatient Medications  Medication Sig Dispense Refill  . acetaminophen (TYLENOL) 500 MG tablet Take 500 mg by mouth daily as needed for headache.    .Marland KitchenamLODipine (NORVASC) 10 MG tablet Take 1 tablet (10 mg total) by mouth daily.    .Marland Kitchenanastrozole (ARIMIDEX) 1 MG tablet Take 1 tablet (1 mg total) by mouth daily. 90 tablet 3  . aspirin 81 MG tablet Take 81 mg by mouth daily.    . Cholecalciferol (VITAMIN D3) 2000 units capsule Take 2,000 Units by mouth daily.     . furosemide (LASIX) 40 MG tablet Take 40 mg by mouth as needed. Swelling    . Multiple Vitamin (MULTIVITAMIN WITH MINERALS) TABS tablet Take 1 tablet by mouth daily.    .Marland KitchentiZANidine (ZANAFLEX) 2 MG tablet TAKE 1 TABLET BY MOUTH 3 TIMES A DAY AS NEEDED FOR MUSCLE SPASMS  0   No current facility-administered medications for this visit.    PHYSICAL EXAMINATION: ECOG PERFORMANCE STATUS: 1 - Symptomatic but completely ambulatory  There were no vitals filed for this visit. There were no vitals filed for this visit.  BREAST: No palpable masses or nodules in either right or left breasts. No palpable axillary supraclavicular or infraclavicular adenopathy no breast tenderness or nipple discharge. (exam performed in the presence of a chaperone)  LABORATORY DATA:  I have reviewed the data as listed CMP Latest Ref Rng & Units 02/20/2016 10/06/2014 10/03/2014  Glucose 65 - 99 mg/dL 98 116(H) 116(H)  BUN  6 - 20 mg/dL '17 9 17  ' Creatinine 0.44 - 1.00 mg/dL 1.41(H) 1.02 1.18(H)  Sodium 135 - 145 mmol/L 139 140 148(H)  Potassium 3.5 - 5.1 mmol/L 3.8 3.5 4.0  Chloride 101 - 111 mmol/L 105 113(H) 113(H)  CO2 22 - 32 mmol/L '27 23 29  ' Calcium 8.9 - 10.3 mg/dL 9.1 8.2(L) 8.8  Total Protein 6.0 - 8.3 g/dL - - -  Total Bilirubin 0.3 - 1.2 mg/dL - - -  Alkaline Phos 39 - 117 U/L - - -  AST 0 - 37 U/L - - -  ALT 0 - 35 U/L - - -     Lab Results  Component Value Date   WBC 6.4 02/20/2016   HGB 13.2 02/20/2016   HCT 41.1 02/20/2016   MCV 88.0 02/20/2016   PLT 356 02/20/2016   NEUTROABS 8.9 (H) 10/02/2014    ASSESSMENT & PLAN:  Breast cancer of upper-outer quadrant of left female breast ([place]) Left lumpectomy08/31/2017:IDC with papillary features, grade 2, 1.1 cm, ADH, superior/medial margin positive, ER 95%, PR 95%, Ki-67 5%, HER-2 negative ratio 1.13,  Margin re-excision 03/12/2016: Negative for cancer, 0/2 lymph nodes negative Pathologic staging: T1c N0 stage IA Oncotype Dx score 0: 2% ROR Adj XRT 04/21/16 to 05/15/16  Treatment Plan: Adjuvant therapy with Anastrozole 1 mg daily X 5 years started 06/29/2016  Anastrozole toxicities: 1.Hot flashesresolved since she started taking anastrozole at bedtime 2.muscle stiffness 3. Vaginal dryness: Discussed with her about using coconut oil for lubrication.  Breast Cancer Surveillance: 1. Mammogram scheduled for 01/11/2020 2. Breast Exam: 11/20/2019: Benign  Return to clinic in 1 year for follow-up    No orders of the defined types were placed in this encounter.  The patient has a good understanding of the overall plan. she agrees with it. she will call with any problems that may develop before the next visit here.  Total time spent: 20 mins including face to face time and time spent for planning, charting and coordination of care  Nicholas Lose, MD 11/20/2019  I, Cloyde Reams Dorshimer, am acting as scribe for Dr. Nicholas Lose.  I have reviewed the above documentation for accuracy and completeness, and I agree with the above.

## 2019-11-20 ENCOUNTER — Other Ambulatory Visit: Payer: Self-pay

## 2019-11-20 ENCOUNTER — Inpatient Hospital Stay: Payer: Medicare PPO | Attending: Hematology and Oncology | Admitting: Hematology and Oncology

## 2019-11-20 DIAGNOSIS — Z79899 Other long term (current) drug therapy: Secondary | ICD-10-CM | POA: Insufficient documentation

## 2019-11-20 DIAGNOSIS — Z170 Estrogen receptor positive status [ER+]: Secondary | ICD-10-CM | POA: Insufficient documentation

## 2019-11-20 DIAGNOSIS — Z79811 Long term (current) use of aromatase inhibitors: Secondary | ICD-10-CM | POA: Diagnosis not present

## 2019-11-20 DIAGNOSIS — C50412 Malignant neoplasm of upper-outer quadrant of left female breast: Secondary | ICD-10-CM | POA: Diagnosis not present

## 2019-11-20 MED ORDER — ANASTROZOLE 1 MG PO TABS: 1.0000 mg | ORAL_TABLET | Freq: Every day | ORAL | 3 refills | Status: DC

## 2019-11-20 NOTE — Assessment & Plan Note (Signed)
Left lumpectomy08/31/2017:IDC with papillary features, grade 2, 1.1 cm, ADH, superior/medial margin positive, ER 95%, PR 95%, Ki-67 5%, HER-2 negative ratio 1.13,  Margin re-excision 03/12/2016: Negative for cancer, 0/2 lymph nodes negative Pathologic staging: T1c N0 stage IA Oncotype Dx score 0: 2% ROR Adj XRT 04/21/16 to 05/15/16  Treatment Plan: Adjuvant therapy with Anastrozole 1 mg daily X 5 years started 06/29/2016  Anastrozole toxicities: 1.Hot flashesresolved since she started taking anastrozole at bedtime 2.muscle stiffness 3. Vaginal dryness  Breast Cancer Surveillance: 1. Mammogram scheduled for 01/11/2020 2. Breast Exam: 11/20/2019: Benign  Return to clinic in 1 year for follow-up

## 2019-11-21 ENCOUNTER — Telehealth: Payer: Self-pay | Admitting: Hematology and Oncology

## 2019-11-21 NOTE — Telephone Encounter (Signed)
Scheduled per 05/24 los, patient has been called and voicemail was left. 

## 2020-01-11 ENCOUNTER — Other Ambulatory Visit: Payer: Self-pay

## 2020-01-11 ENCOUNTER — Ambulatory Visit
Admission: RE | Admit: 2020-01-11 | Discharge: 2020-01-11 | Disposition: A | Discharge status: Home / Self Care | Payer: Medicare PPO | Source: Ambulatory Visit | Attending: Hematology and Oncology | Admitting: Hematology and Oncology

## 2020-01-11 DIAGNOSIS — Z853 Personal history of malignant neoplasm of breast: Secondary | ICD-10-CM

## 2020-06-04 DIAGNOSIS — I129 Hypertensive chronic kidney disease with stage 1 through stage 4 chronic kidney disease, or unspecified chronic kidney disease: Secondary | ICD-10-CM | POA: Diagnosis not present

## 2020-06-04 DIAGNOSIS — N183 Chronic kidney disease, stage 3 unspecified: Secondary | ICD-10-CM | POA: Diagnosis not present

## 2020-06-04 DIAGNOSIS — Z8639 Personal history of other endocrine, nutritional and metabolic disease: Secondary | ICD-10-CM | POA: Diagnosis not present

## 2020-06-04 DIAGNOSIS — I10 Essential (primary) hypertension: Secondary | ICD-10-CM | POA: Diagnosis not present

## 2020-06-04 DIAGNOSIS — Z23 Encounter for immunization: Secondary | ICD-10-CM | POA: Diagnosis not present

## 2020-06-04 DIAGNOSIS — E559 Vitamin D deficiency, unspecified: Secondary | ICD-10-CM | POA: Diagnosis not present

## 2020-06-04 DIAGNOSIS — E7800 Pure hypercholesterolemia, unspecified: Secondary | ICD-10-CM | POA: Diagnosis not present

## 2020-07-03 DIAGNOSIS — H40013 Open angle with borderline findings, low risk, bilateral: Secondary | ICD-10-CM | POA: Diagnosis not present

## 2020-07-11 DIAGNOSIS — Z8639 Personal history of other endocrine, nutritional and metabolic disease: Secondary | ICD-10-CM | POA: Diagnosis not present

## 2020-07-11 DIAGNOSIS — E7800 Pure hypercholesterolemia, unspecified: Secondary | ICD-10-CM | POA: Diagnosis not present

## 2020-07-11 DIAGNOSIS — E782 Mixed hyperlipidemia: Secondary | ICD-10-CM | POA: Diagnosis not present

## 2020-07-11 DIAGNOSIS — E559 Vitamin D deficiency, unspecified: Secondary | ICD-10-CM | POA: Diagnosis not present

## 2020-07-11 DIAGNOSIS — I129 Hypertensive chronic kidney disease with stage 1 through stage 4 chronic kidney disease, or unspecified chronic kidney disease: Secondary | ICD-10-CM | POA: Diagnosis not present

## 2020-07-11 DIAGNOSIS — I10 Essential (primary) hypertension: Secondary | ICD-10-CM | POA: Diagnosis not present

## 2020-07-11 DIAGNOSIS — Z87440 Personal history of urinary (tract) infections: Secondary | ICD-10-CM | POA: Diagnosis not present

## 2020-07-11 DIAGNOSIS — N183 Chronic kidney disease, stage 3 unspecified: Secondary | ICD-10-CM | POA: Diagnosis not present

## 2020-07-17 DIAGNOSIS — N1832 Chronic kidney disease, stage 3b: Secondary | ICD-10-CM | POA: Diagnosis not present

## 2020-07-22 DIAGNOSIS — I129 Hypertensive chronic kidney disease with stage 1 through stage 4 chronic kidney disease, or unspecified chronic kidney disease: Secondary | ICD-10-CM | POA: Diagnosis not present

## 2020-07-22 DIAGNOSIS — N1832 Chronic kidney disease, stage 3b: Secondary | ICD-10-CM | POA: Diagnosis not present

## 2020-07-22 DIAGNOSIS — C50919 Malignant neoplasm of unspecified site of unspecified female breast: Secondary | ICD-10-CM | POA: Diagnosis not present

## 2020-07-22 DIAGNOSIS — E559 Vitamin D deficiency, unspecified: Secondary | ICD-10-CM | POA: Diagnosis not present

## 2020-07-22 DIAGNOSIS — E7800 Pure hypercholesterolemia, unspecified: Secondary | ICD-10-CM | POA: Diagnosis not present

## 2020-07-22 DIAGNOSIS — R809 Proteinuria, unspecified: Secondary | ICD-10-CM | POA: Diagnosis not present

## 2020-10-01 DIAGNOSIS — Z86010 Personal history of colonic polyps: Secondary | ICD-10-CM | POA: Diagnosis not present

## 2020-10-01 DIAGNOSIS — Z9049 Acquired absence of other specified parts of digestive tract: Secondary | ICD-10-CM | POA: Diagnosis not present

## 2020-11-18 NOTE — Progress Notes (Signed)
Patient Care Team: Leighton Ruff, MD as PCP - General (Family Medicine) Nicholas Lose, MD as Consulting Physician (Hematology and Oncology) Delice Bison, Charlestine Massed, NP as Nurse Practitioner (Hematology and Oncology) Kyung Rudd, MD as Consulting Physician (Radiation Oncology) [name], MD as Consulting Physician (General Surgery)  DIAGNOSIS:    ICD-10-CM   1. Malignant neoplasm of upper-outer quadrant of left breast in female, estrogen receptor positive ([place])  C50.412    Z17.0     SUMMARY OF ONCOLOGIC HISTORY: Oncology History  Breast cancer of upper-outer quadrant of left female breast ([place])  01/02/2016 Initial Biopsy   Left breast biopsy 1:00: Ductal papilloma with [facility]   02/27/2016 Surgery   Left lumpectomy: IDC with papillary features, grade 2, 1.1 cm, ADH, superior/medial margin positive, ER 95%, PR 95%, Ki-67 5%, HER-2 negative ratio 1.13, T1c N0 stage IA   03/12/2016 Surgery   Left superior and medial margin reexcision: Negative for malignancy, 0/2 lymph nodes; Oncotype Dx 0 (2% ROR)    04/21/2016 - 05/18/2016 Radiation Therapy   Adj XRT [name]): The patient initially received a dose of 42.5 Gy in 17 fractions to the breast using whole-breast tangent fields. This was delivered using a 3-D conformal technique. The patient then received a boost to the seroma. This delivered an additional 7.5 Gy in 3 fractions using a 3 field photon technique due to the depth of the seroma. The total dose was 50 Gy.   06/29/2016 -  Anti-estrogen oral therapy   Anastrozole 1 mg daily     CHIEF COMPLIANT: Follow-up of left breast cancer on anastrozole   INTERVAL HISTORY: [name] is a 80 y.o. with above-mentioned history of left breast cancer treated with lumpectomy, radiation, and who is currently on antiestrogen therapy with anastrozole. Mammogram on 01/11/20 showed probably benign calcifications in the lumpectomy bed in the left breast and no evidence of malignancy. She presents to  the clinic today for follow-up.   ALLERGIES:  is allergic to hydrocodone-acetaminophen.  MEDICATIONS:  Current Outpatient Medications  Medication Sig Dispense Refill  . acetaminophen (TYLENOL) 500 MG tablet Take 500 mg by mouth daily as needed for headache.    Marland Kitchen amLODipine (NORVASC) 10 MG tablet Take 1 tablet (10 mg total) by mouth daily.    Marland Kitchen anastrozole (ARIMIDEX) 1 MG tablet Take 1 tablet (1 mg total) by mouth daily. 90 tablet 3  . aspirin 81 MG tablet Take 81 mg by mouth daily.    . Cholecalciferol (VITAMIN D3) 2000 units capsule Take 2,000 Units by mouth daily.     . furosemide (LASIX) 40 MG tablet Take 40 mg by mouth as needed. Swelling    . Multiple Vitamin (MULTIVITAMIN WITH MINERALS) TABS tablet Take 1 tablet by mouth daily.    Marland Kitchen tiZANidine (ZANAFLEX) 2 MG tablet TAKE 1 TABLET BY MOUTH 3 TIMES A DAY AS NEEDED FOR MUSCLE SPASMS  0   No current facility-administered medications for this visit.    PHYSICAL EXAMINATION: ECOG PERFORMANCE STATUS: 1 - Symptomatic but completely ambulatory  Vitals:   11/19/20 0958  BP: (!) 164/79  Pulse: 64  Resp: 18  Temp: (!) 97.5 F (36.4 C)  SpO2: 99%   Filed Weights   11/19/20 0958  Weight: 187 lb 14.4 oz (85.2 kg)    BREAST: No palpable masses or nodules in either right or left breasts. No palpable axillary supraclavicular or infraclavicular adenopathy no breast tenderness or nipple discharge. (exam performed in the presence of a chaperone)  LABORATORY DATA:  I have reviewed the data as listed CMP Latest Ref Rng & Units 02/20/2016 10/06/2014 10/03/2014  Glucose 65 - 99 mg/dL 98 116(H) 116(H)  BUN 6 - 20 mg/dL '17 9 17  ' Creatinine 0.44 - 1.00 mg/dL 1.41(H) 1.02 1.18(H)  Sodium 135 - 145 mmol/L 139 140 148(H)  Potassium 3.5 - 5.1 mmol/L 3.8 3.5 4.0  Chloride 101 - 111 mmol/L 105 113(H) 113(H)  CO2 22 - 32 mmol/L '27 23 29  ' Calcium 8.9 - 10.3 mg/dL 9.1 8.2(L) 8.8  Total Protein 6.0 - 8.3 g/dL - - -  Total Bilirubin 0.3 - 1.2 mg/dL - - -   Alkaline Phos 39 - 117 U/L - - -  AST 0 - 37 U/L - - -  ALT 0 - 35 U/L - - -    Lab Results  Component Value Date   WBC 6.4 02/20/2016   HGB 13.2 02/20/2016   HCT 41.1 02/20/2016   MCV 88.0 02/20/2016   PLT 356 02/20/2016   NEUTROABS 8.9 (H) 10/02/2014    ASSESSMENT & PLAN:  Breast cancer of upper-outer quadrant of left female breast ([place]) Left lumpectomy08/31/2017:IDC with papillary features, grade 2, 1.1 cm, ADH, superior/medial margin positive, ER 95%, PR 95%, Ki-67 5%, HER-2 negative ratio 1.13,  Margin re-excision 03/12/2016: Negative for cancer, 0/2 lymph nodes negative Pathologic staging: T1c N0 stage IA Oncotype Dx score 0: 2% ROR Adj XRT 04/21/16 to 05/15/16  Treatment Plan: Adjuvant therapy with Anastrozole 1 mg daily X 5 years started 06/29/2016 She will finish 5 years by end of December 2022  Anastrozole toxicities: 1.Hot flashesresolved since she started taking anastrozole at bedtime 2.muscle stiffness I encouraged her to exercise regularly.  Breast Cancer Surveillance: 1. Mammogram 01/11/2020: Benign calcifications left breast lumpectomy site, breast density category B 2. Breast Exam: 11/19/2020: Benign  Return to clinic in 1 year for follow-up and after that she can be seen on an as-needed basis.    No orders of the defined types were placed in this encounter.  The patient has a good understanding of the overall plan. she agrees with it. she will call with any problems that may develop before the next visit here.  Total time spent: 20 mins including face to face time and time spent for planning, charting and coordination of care  Rulon Eisenmenger, MD, MPH 11/19/2020  I, Cloyde Reams Dorshimer, am acting as scribe for Dr. Nicholas Lose.  I have reviewed the above documentation for accuracy and completeness, and I agree with the above.

## 2020-11-19 ENCOUNTER — Other Ambulatory Visit: Payer: Self-pay

## 2020-11-19 ENCOUNTER — Inpatient Hospital Stay: Payer: Medicare PPO | Attending: Hematology and Oncology | Admitting: Hematology and Oncology

## 2020-11-19 DIAGNOSIS — M6289 Other specified disorders of muscle: Secondary | ICD-10-CM | POA: Diagnosis not present

## 2020-11-19 DIAGNOSIS — Z79811 Long term (current) use of aromatase inhibitors: Secondary | ICD-10-CM | POA: Diagnosis not present

## 2020-11-19 DIAGNOSIS — C50412 Malignant neoplasm of upper-outer quadrant of left female breast: Secondary | ICD-10-CM | POA: Diagnosis not present

## 2020-11-19 DIAGNOSIS — Z170 Estrogen receptor positive status [ER+]: Secondary | ICD-10-CM

## 2020-11-19 MED ORDER — ANASTROZOLE 1 MG PO TABS: 1.0000 mg | ORAL_TABLET | Freq: Every day | ORAL | 1 refills | Status: DC

## 2020-11-19 NOTE — Assessment & Plan Note (Signed)
Left lumpectomy08/31/2017:IDC with papillary features, grade 2, 1.1 cm, ADH, superior/medial margin positive, ER 95%, PR 95%, Ki-67 5%, HER-2 negative ratio 1.13,  Margin re-excision 03/12/2016: Negative for cancer, 0/2 lymph nodes negative Pathologic staging: T1c N0 stage IA Oncotype Dx score 0: 2% ROR Adj XRT 04/21/16 to 05/15/16  Treatment Plan: Adjuvant therapy with Anastrozole 1 mg daily X 5 years started 06/29/2016  Anastrozole toxicities: 1.Hot flashesresolved since [name] started taking anastrozole at bedtime 2.muscle stiffness 3. Vaginal dryness: Discussed with her about using coconut oil for lubrication.  Breast Cancer Surveillance: 1. Mammogram 01/11/2020: Benign calcifications left breast lumpectomy site, breast density category B 2. Breast Exam: 11/19/2020: Benign  Return to clinic in 1 year for follow-up

## 2020-11-20 ENCOUNTER — Telehealth: Payer: Self-pay | Admitting: Hematology and Oncology

## 2020-11-20 NOTE — Telephone Encounter (Signed)
Scheduled appointment per 05/24 los. Patient is aware.

## 2020-12-04 DIAGNOSIS — H40013 Open angle with borderline findings, low risk, bilateral: Secondary | ICD-10-CM | POA: Diagnosis not present

## 2020-12-11 ENCOUNTER — Other Ambulatory Visit: Payer: Self-pay | Admitting: Family Medicine

## 2020-12-11 DIAGNOSIS — Z1231 Encounter for screening mammogram for malignant neoplasm of breast: Secondary | ICD-10-CM

## 2020-12-19 DIAGNOSIS — D122 Benign neoplasm of ascending colon: Secondary | ICD-10-CM | POA: Diagnosis not present

## 2020-12-19 DIAGNOSIS — K648 Other hemorrhoids: Secondary | ICD-10-CM | POA: Diagnosis not present

## 2020-12-19 DIAGNOSIS — D124 Benign neoplasm of descending colon: Secondary | ICD-10-CM | POA: Diagnosis not present

## 2020-12-19 DIAGNOSIS — Z86010 Personal history of colonic polyps: Secondary | ICD-10-CM | POA: Diagnosis not present

## 2020-12-19 DIAGNOSIS — D123 Benign neoplasm of transverse colon: Secondary | ICD-10-CM | POA: Diagnosis not present

## 2020-12-25 DIAGNOSIS — D123 Benign neoplasm of transverse colon: Secondary | ICD-10-CM | POA: Diagnosis not present

## 2020-12-25 DIAGNOSIS — D122 Benign neoplasm of ascending colon: Secondary | ICD-10-CM | POA: Diagnosis not present

## 2020-12-25 DIAGNOSIS — D124 Benign neoplasm of descending colon: Secondary | ICD-10-CM | POA: Diagnosis not present

## 2021-01-09 DIAGNOSIS — N1832 Chronic kidney disease, stage 3b: Secondary | ICD-10-CM | POA: Diagnosis not present

## 2021-01-13 DIAGNOSIS — C50912 Malignant neoplasm of unspecified site of left female breast: Secondary | ICD-10-CM | POA: Diagnosis not present

## 2021-01-13 DIAGNOSIS — I129 Hypertensive chronic kidney disease with stage 1 through stage 4 chronic kidney disease, or unspecified chronic kidney disease: Secondary | ICD-10-CM | POA: Diagnosis not present

## 2021-01-13 DIAGNOSIS — N183 Chronic kidney disease, stage 3 unspecified: Secondary | ICD-10-CM | POA: Diagnosis not present

## 2021-01-13 DIAGNOSIS — Z6831 Body mass index (BMI) 31.0-31.9, adult: Secondary | ICD-10-CM | POA: Diagnosis not present

## 2021-01-13 DIAGNOSIS — E7800 Pure hypercholesterolemia, unspecified: Secondary | ICD-10-CM | POA: Diagnosis not present

## 2021-01-16 DIAGNOSIS — I129 Hypertensive chronic kidney disease with stage 1 through stage 4 chronic kidney disease, or unspecified chronic kidney disease: Secondary | ICD-10-CM | POA: Diagnosis not present

## 2021-01-16 DIAGNOSIS — C50919 Malignant neoplasm of unspecified site of unspecified female breast: Secondary | ICD-10-CM | POA: Diagnosis not present

## 2021-01-16 DIAGNOSIS — R809 Proteinuria, unspecified: Secondary | ICD-10-CM | POA: Diagnosis not present

## 2021-01-16 DIAGNOSIS — N1832 Chronic kidney disease, stage 3b: Secondary | ICD-10-CM | POA: Diagnosis not present

## 2021-01-16 DIAGNOSIS — E559 Vitamin D deficiency, unspecified: Secondary | ICD-10-CM | POA: Diagnosis not present

## 2021-01-17 ENCOUNTER — Other Ambulatory Visit: Payer: Self-pay | Admitting: Hematology and Oncology

## 2021-01-17 DIAGNOSIS — R921 Mammographic calcification found on diagnostic imaging of breast: Secondary | ICD-10-CM

## 2021-01-17 DIAGNOSIS — Z853 Personal history of malignant neoplasm of breast: Secondary | ICD-10-CM

## 2021-01-22 ENCOUNTER — Ambulatory Visit
Admission: RE | Admit: 2021-01-22 | Discharge: 2021-01-22 | Disposition: A | Discharge status: Home / Self Care | Payer: Medicare PPO | Source: Ambulatory Visit | Attending: Family Medicine | Admitting: Family Medicine

## 2021-01-22 ENCOUNTER — Other Ambulatory Visit: Payer: Self-pay

## 2021-01-22 DIAGNOSIS — R922 Inconclusive mammogram: Secondary | ICD-10-CM | POA: Diagnosis not present

## 2021-01-22 DIAGNOSIS — Z853 Personal history of malignant neoplasm of breast: Secondary | ICD-10-CM

## 2021-01-22 DIAGNOSIS — R921 Mammographic calcification found on diagnostic imaging of breast: Secondary | ICD-10-CM

## 2021-02-25 DIAGNOSIS — Z885 Allergy status to narcotic agent status: Secondary | ICD-10-CM | POA: Diagnosis not present

## 2021-02-25 DIAGNOSIS — Z8249 Family history of ischemic heart disease and other diseases of the circulatory system: Secondary | ICD-10-CM | POA: Diagnosis not present

## 2021-02-25 DIAGNOSIS — I10 Essential (primary) hypertension: Secondary | ICD-10-CM | POA: Diagnosis not present

## 2021-02-25 DIAGNOSIS — E785 Hyperlipidemia, unspecified: Secondary | ICD-10-CM | POA: Diagnosis not present

## 2021-02-25 DIAGNOSIS — C50919 Malignant neoplasm of unspecified site of unspecified female breast: Secondary | ICD-10-CM | POA: Diagnosis not present

## 2021-02-25 DIAGNOSIS — H269 Unspecified cataract: Secondary | ICD-10-CM | POA: Diagnosis not present

## 2021-02-25 DIAGNOSIS — Z79811 Long term (current) use of aromatase inhibitors: Secondary | ICD-10-CM | POA: Diagnosis not present

## 2021-04-03 DIAGNOSIS — Z23 Encounter for immunization: Secondary | ICD-10-CM | POA: Diagnosis not present

## 2021-04-03 DIAGNOSIS — Z0000 Encounter for general adult medical examination without abnormal findings: Secondary | ICD-10-CM | POA: Diagnosis not present

## 2021-04-03 DIAGNOSIS — Z1389 Encounter for screening for other disorder: Secondary | ICD-10-CM | POA: Diagnosis not present

## 2021-06-05 DIAGNOSIS — H40013 Open angle with borderline findings, low risk, bilateral: Secondary | ICD-10-CM | POA: Diagnosis not present

## 2021-06-19 ENCOUNTER — Other Ambulatory Visit: Payer: Self-pay | Admitting: Hematology and Oncology

## 2021-07-03 DIAGNOSIS — Z8249 Family history of ischemic heart disease and other diseases of the circulatory system: Secondary | ICD-10-CM | POA: Diagnosis not present

## 2021-07-03 DIAGNOSIS — I10 Essential (primary) hypertension: Secondary | ICD-10-CM | POA: Diagnosis not present

## 2021-07-03 DIAGNOSIS — Z853 Personal history of malignant neoplasm of breast: Secondary | ICD-10-CM | POA: Diagnosis not present

## 2021-07-08 DIAGNOSIS — N1832 Chronic kidney disease, stage 3b: Secondary | ICD-10-CM | POA: Diagnosis not present

## 2021-07-08 DIAGNOSIS — N390 Urinary tract infection, site not specified: Secondary | ICD-10-CM | POA: Diagnosis not present

## 2021-07-11 DIAGNOSIS — E559 Vitamin D deficiency, unspecified: Secondary | ICD-10-CM | POA: Diagnosis not present

## 2021-07-11 DIAGNOSIS — D631 Anemia in chronic kidney disease: Secondary | ICD-10-CM | POA: Diagnosis not present

## 2021-07-11 DIAGNOSIS — R809 Proteinuria, unspecified: Secondary | ICD-10-CM | POA: Diagnosis not present

## 2021-07-11 DIAGNOSIS — N1832 Chronic kidney disease, stage 3b: Secondary | ICD-10-CM | POA: Diagnosis not present

## 2021-07-11 DIAGNOSIS — I129 Hypertensive chronic kidney disease with stage 1 through stage 4 chronic kidney disease, or unspecified chronic kidney disease: Secondary | ICD-10-CM | POA: Diagnosis not present

## 2021-07-16 ENCOUNTER — Telehealth: Payer: Self-pay

## 2021-07-16 DIAGNOSIS — I129 Hypertensive chronic kidney disease with stage 1 through stage 4 chronic kidney disease, or unspecified chronic kidney disease: Secondary | ICD-10-CM | POA: Diagnosis not present

## 2021-07-16 DIAGNOSIS — E7800 Pure hypercholesterolemia, unspecified: Secondary | ICD-10-CM | POA: Diagnosis not present

## 2021-07-16 DIAGNOSIS — N183 Chronic kidney disease, stage 3 unspecified: Secondary | ICD-10-CM | POA: Diagnosis not present

## 2021-07-16 NOTE — Telephone Encounter (Signed)
Pt called and LVM asking for nurse call back. Returned pt's call and pt states she has stopped her Tamoxifen per MD at 5 year mark but has developed (L) hand swelling. Pt denies tightness, redness, neuropathy but is concerned for lymphedema. Pt was offered appt with Mendel Ryder, NP 07/17/21 and accepted appt. Pt knows to arrive at 0945 for check in.

## 2021-07-16 NOTE — Progress Notes (Signed)
[facility] Follow up:    Kathyrn Lass, MD [place] [address]   DIAGNOSIS:  Cancer Staging  Breast cancer of upper-outer quadrant of left female breast [facility]) Staging form: Breast, AJCC 7th Edition - Pathologic stage from 03/12/2016: Stage IA (T1c, N0, cM0) - Signed by Kyung Rudd, MD on 03/26/2016 Laterality: Left Tumor size (mm): 11 Method of lymph node assessment: Sentinel lymph node biopsy Histologic grade (G): G2 Histologic grading system: 3 grade system Lymph-vascular invasion (LVI): LVI not present (absent)/not identified Residual tumor (R): R0 - None Estrogen receptor status: Positive Progesterone receptor status: Positive   SUMMARY OF ONCOLOGIC HISTORY: Oncology History  Breast cancer of upper-outer quadrant of left female breast ([place])  01/02/2016 Initial Biopsy   Left breast biopsy 1:00: Ductal papilloma with [facility]   02/27/2016 Surgery   Left lumpectomy: IDC with papillary features, grade 2, 1.1 cm, ADH, superior/medial margin positive, ER 95%, PR 95%, Ki-67 5%, HER-2 negative ratio 1.13, T1c N0 stage IA   03/12/2016 Surgery   Left superior and medial margin reexcision: Negative for malignancy, 0/2 lymph nodes; Oncotype Dx 0 (2% ROR)    04/21/2016 - 05/18/2016 Radiation Therapy   Adj XRT Lisbeth Renshaw): The patient initially received a dose of 42.5 Gy in 17 fractions to the breast using whole-breast tangent fields. This was delivered using a 3-D conformal technique. The patient then received a boost to the seroma. This delivered an additional 7.5 Gy in 3 fractions using a 3 field photon technique due to the depth of the seroma. The total dose was 50 Gy.   06/29/2016 - 06/28/2021 Anti-estrogen oral therapy   Anastrozole 1 mg daily     CURRENT THERAPY: Anastrozole daily completed 06/28/2021.  INTERVAL HISTORY: Aryssa Rosamond 81 y.o. female returns for evaluation of left hand swelling.  She is left-handed.  The swelling first began about 1  week ago.  She doesn't recall picking up anything too heavy.  She thinks she had swelling previously however she didn't get a sleeve and it slowly resolved.    Her most recent mammogram was completed on January 22, 2021 which showed calcifications in the lumpectomy bed which were mildly increased but consistent with fat necrosis recommended diagnostic mammogram in 1 year.  Breast density was category B.   Patient Active Problem List   Diagnosis Date Noted   Vitamin D deficiency 07/17/2021   Toxic multinodular goiter 07/17/2021   Personal history of colonic polyps 07/17/2021   Hypercholesterolemia 07/17/2021   History of hyperthyroidism 07/17/2021   Acquired absence of other specified parts of digestive tract 07/17/2021   Breast cancer of upper-outer quadrant of left female breast ([place]) 03/19/2016   Essential hypertension 10/09/2014   Chronic renal insufficiency 10/09/2014   Bochdalek hernia 10/02/2014    is allergic to amlodipine besy-benazepril hcl, atorvastatin, hydrocodone-acetaminophen, and lisinopril.  MEDICAL HISTORY: Past Medical History:  Diagnosis Date   Allergy    Breast cancer ([place]) 01/02/2016   left breast   Chronic renal insufficiency 10/09/2014   Dehydration 10/09/2014   Essential hypertension 10/09/2014   Hypertension    Personal history of radiation therapy    2017 left breast   Pre-diabetes    Wears glasses     SURGICAL HISTORY: Past Surgical History:  Procedure Laterality Date   ABDOMINAL HYSTERECTOMY     BREAST LUMPECTOMY Left 2017   BREAST LUMPECTOMY WITH RADIOACTIVE SEED LOCALIZATION Left 02/27/2016   Procedure: LEFT BREAST LUMPECTOMY WITH RADIOACTIVE SEED LOCALIZATION;  Surgeon:  Autumn Messing III, MD;  Location: [facility];  Service: General;  Laterality: Left;   BREAST LUMPECTOMY WITH SENTINEL LYMPH NODE BIOPSY Left 03/12/2016   Procedure: RE-EXCISION OF LEFT BREAST SUPERIOR  AND MEDIAL MARGIN  WITH SENTINEL LYMPH NODE BX;  Surgeon: Autumn Messing III, MD;  Location:  [facility];  Service: General;  Laterality: Left;   CHOLECYSTECTOMY     COLON SURGERY     GASTROSTOMY N/A 10/04/2014   Procedure: GASTROSTOMY;  Surgeon: Autumn Messing III, MD;  Location: [facility];  Service: General;  Laterality: N/A;   PARTIAL COLECTOMY     VENTRAL HERNIA REPAIR N/A 10/04/2014   Procedure: DIAPHRAGMATIC HERNIA REPAIR;  Surgeon: Autumn Messing III, MD;  Location: [facility];  Service: General;  Laterality: N/A;    SOCIAL HISTORY: Social History   Socioeconomic History   Marital status: Married    Spouse name: Not on file   Number of children: Not on file   Years of education: Not on file   Highest education level: Not on file  Occupational History   Not on file  Tobacco Use   Smoking status: Never   Smokeless tobacco: Never  Substance and Sexual Activity   Alcohol use: No   Drug use: No   Sexual activity: Not on file  Other Topics Concern   Not on file  Social History Narrative   Not on file   Social Determinants of Health   Financial Resource Strain: Not on file  Food Insecurity: Not on file  Transportation Needs: Not on file  Physical Activity: Not on file  Stress: Not on file  Social Connections: Not on file  Intimate Partner Violence: Not on file    FAMILY HISTORY: Noncontributory  Review of Systems  Constitutional:  Negative for appetite change, chills, fatigue, fever and unexpected weight change.  HENT:   Negative for hearing loss, lump/mass and trouble swallowing.   Eyes:  Negative for eye problems and icterus.  Respiratory:  Negative for chest tightness, cough and shortness of breath.   Cardiovascular:  Negative for chest pain, leg swelling and palpitations.  Gastrointestinal:  Negative for abdominal distention, abdominal pain, constipation, diarrhea, nausea and vomiting.  Endocrine: Negative for hot flashes.  Genitourinary:  Negative for difficulty urinating.   Musculoskeletal:  Negative for arthralgias.  Skin:  Negative for itching and  rash.  Neurological:  Negative for dizziness, extremity weakness, headaches and numbness.  Hematological:  Negative for adenopathy. Does not bruise/bleed easily.  Psychiatric/Behavioral:  Negative for depression. The patient is not nervous/anxious.      PHYSICAL EXAMINATION  ECOG PERFORMANCE STATUS: 1 - Symptomatic but completely ambulatory  Vitals:   07/17/21 0950  BP: (!) 159/89  Pulse: 64  Resp: 16  Temp: (!) 97.5 F (36.4 C)  SpO2: 98%    Physical Exam Constitutional:      General: She is not in acute distress.    Appearance: Normal appearance. She is not toxic-appearing.  HENT:     Head: Normocephalic and atraumatic.  Eyes:     General: No scleral icterus. Cardiovascular:     Rate and Rhythm: Normal rate and regular rhythm.     Pulses: Normal pulses.     Heart sounds: Normal heart sounds.  Pulmonary:     Effort: Pulmonary effort is normal.     Breath sounds: Normal breath sounds.  Abdominal:     General: Abdomen is flat. Bowel sounds are normal. There is no distension.     Palpations:  Abdomen is soft.     Tenderness: There is no abdominal tenderness.  Musculoskeletal:        General: No swelling.     Cervical back: Neck supple.  Lymphadenopathy:     Cervical: No cervical adenopathy.  Skin:    General: Skin is warm and dry.     Findings: No rash.  Neurological:     General: No focal deficit present.     Mental Status: She is alert.  Psychiatric:        Mood and Affect: Mood normal.        Behavior: Behavior normal.    LABORATORY DATA: She did not undergo lab testing with [place] today.   ASSESSMENT and THERAPY PLAN:   Breast cancer of upper-outer quadrant of left female breast ([place]) Left lumpectomy 02/27/2016: IDC with papillary features, grade 2, 1.1 cm, ADH, superior/medial margin positive, ER 95%, PR 95%, Ki-67 5%, HER-2 negative ratio 1.13,  Margin re-excision 03/12/2016: Negative for cancer, 0/2 lymph nodes negative Pathologic staging: T1c N0 stage  IA Oncotype Dx score 0: 2% ROR Adj XRT 04/21/16 to 05/15/16 Completed 5 years of anastrozole December 2022.  Treatment Plan:  Bernardina has a new onset of progressively worsening left arm swelling.  This does appear consistent with lymphedema, however since she is 5 years out from her initial surgery I would like to get CT chest to make sure it is surgery related lymphedema and not obstructive lymphedema.  He is understands this and knows that I will order this.  Her most recent creatinine was 1.39, GFR of 39, and positive for protein in her urine which was completed on January 10th 2023.  We received these faxed over from Dr. Louie Boston office and I will have the scanned.  Due to her risk for contrast related acute kidney injury I have placed orders for a CT chest without contrast.  I also placed a referral for Mykell to see our physical therapy team for evaluation and management of lymphedema.  Bethel has follow-up scheduled in May 2023 with Dr. [name] and I encouraged her to keep this appointment.   All questions were answered. The patient knows to call the clinic with any problems, questions or concerns. We can certainly see the patient much sooner if necessary.  Total encounter time: 20 minutes in face-to-face visit time, chart review, lab review, care coordination, and documentation of the encounter.  Wilber Bihari, NP 07/17/21 1:27 PM Medical Oncology and Hematology [facility] [place], [place] [address] Tel. [phone]    Fax. [phone]  *Total Encounter Time as defined by the Centers for Medicare and Medicaid Services includes, in addition to the face-to-face time of a patient visit (documented in the note above) non-face-to-face time: obtaining and reviewing outside history, ordering and reviewing medications, tests or procedures, care coordination (communications with other health care professionals or caregivers) and documentation in the medical  record.

## 2021-07-17 ENCOUNTER — Inpatient Hospital Stay: Payer: Medicare PPO | Attending: Adult Health | Admitting: Adult Health

## 2021-07-17 ENCOUNTER — Encounter: Payer: Self-pay | Admitting: Adult Health

## 2021-07-17 ENCOUNTER — Other Ambulatory Visit: Payer: Self-pay

## 2021-07-17 VITALS — BP 159/89 | HR 64 | Temp 97.5°F | Resp 16 | Ht 68.0 in | Wt 190.6 lb

## 2021-07-17 DIAGNOSIS — E0520 Thyrotoxicosis with toxic multinodular goiter without thyrotoxic crisis or storm: Secondary | ICD-10-CM | POA: Insufficient documentation

## 2021-07-17 DIAGNOSIS — Z8639 Personal history of other endocrine, nutritional and metabolic disease: Secondary | ICD-10-CM | POA: Insufficient documentation

## 2021-07-17 DIAGNOSIS — C50412 Malignant neoplasm of upper-outer quadrant of left female breast: Secondary | ICD-10-CM

## 2021-07-17 DIAGNOSIS — Z170 Estrogen receptor positive status [ER+]: Secondary | ICD-10-CM | POA: Diagnosis not present

## 2021-07-17 DIAGNOSIS — Z923 Personal history of irradiation: Secondary | ICD-10-CM | POA: Diagnosis not present

## 2021-07-17 DIAGNOSIS — Z860100 Personal history of colon polyps, unspecified: Secondary | ICD-10-CM | POA: Insufficient documentation

## 2021-07-17 DIAGNOSIS — E559 Vitamin D deficiency, unspecified: Secondary | ICD-10-CM | POA: Insufficient documentation

## 2021-07-17 DIAGNOSIS — M7989 Other specified soft tissue disorders: Secondary | ICD-10-CM | POA: Diagnosis not present

## 2021-07-17 DIAGNOSIS — Z9049 Acquired absence of other specified parts of digestive tract: Secondary | ICD-10-CM | POA: Insufficient documentation

## 2021-07-17 DIAGNOSIS — E7800 Pure hypercholesterolemia, unspecified: Secondary | ICD-10-CM | POA: Insufficient documentation

## 2021-07-17 NOTE — Assessment & Plan Note (Addendum)
Left lumpectomy 02/27/2016: IDC with papillary features, grade 2, 1.1 cm, ADH, superior/medial margin positive, ER 95%, PR 95%, Ki-67 5%, HER-2 negative ratio 1.13,  °Margin re-excision 03/12/2016: Negative for cancer, 0/2 lymph nodes negative °Pathologic staging: T1c N0 stage IA °Oncotype Dx score 0: 2% ROR °Adj XRT 04/21/16 to 05/15/16 °Completed 5 years of anastrozole December 2022. ° °Treatment Plan:  °[name] has a new onset of progressively worsening left arm swelling.  This does appear consistent with lymphedema, however since she is 5 years out from her initial surgery I would like to get CT chest to make sure it is surgery related lymphedema and not obstructive lymphedema.  He is understands this and knows that I will order this. ° °Her most recent creatinine was 1.39, GFR of 39, and positive for protein in her urine which was completed on January 10th 2023.  We received these faxed over from Dr. Bhandari's office and I will have the scanned.  Due to her risk for contrast related acute kidney injury I have placed orders for a CT chest without contrast. ° °I also placed a referral for [name] to see our physical therapy team for evaluation and management of lymphedema. ° °[name] has follow-up scheduled in May 2023 with Dr. Gudena and I encouraged her to keep this appointment. °

## 2021-07-21 ENCOUNTER — Ambulatory Visit: Payer: Medicare PPO | Attending: Adult Health

## 2021-07-21 ENCOUNTER — Other Ambulatory Visit: Payer: Self-pay

## 2021-07-21 DIAGNOSIS — I890 Lymphedema, not elsewhere classified: Secondary | ICD-10-CM | POA: Diagnosis not present

## 2021-07-21 DIAGNOSIS — M25512 Pain in left shoulder: Secondary | ICD-10-CM | POA: Diagnosis not present

## 2021-07-21 DIAGNOSIS — C50412 Malignant neoplasm of upper-outer quadrant of left female breast: Secondary | ICD-10-CM | POA: Diagnosis not present

## 2021-07-21 DIAGNOSIS — Z170 Estrogen receptor positive status [ER+]: Secondary | ICD-10-CM | POA: Diagnosis not present

## 2021-07-21 NOTE — Therapy (Signed)
[facility] @ [place], [place], [address] Phone: [phone]   Fax:  [phone]  Physical Therapy Evaluation  Patient Details  Name: [name] MRN: [id number] Date of Birth: 05/24/1941 Referring Provider (PT): Wilber Bihari   Encounter Date: 07/21/2021   PT End of Session - 07/21/21 0852     Visit Number 1    Number of Visits 12    Date for PT Re-Evaluation 09/01/21    Authorization Type [employer]    Authorization - Visit Number 12    PT Start Time 0802    PT Stop Time 4315    PT Time Calculation (min) 47 min    Activity Tolerance Patient tolerated treatment well    Behavior During Therapy [facility] for tasks assessed/performed             Past Medical History:  Diagnosis Date   Allergy    Breast cancer ([place]) 01/02/2016   left breast   Chronic renal insufficiency 10/09/2014   Dehydration 10/09/2014   Essential hypertension 10/09/2014   Hypertension    Personal history of radiation therapy    2017 left breast   Pre-diabetes    Wears glasses     Past Surgical History:  Procedure Laterality Date   ABDOMINAL HYSTERECTOMY     BREAST LUMPECTOMY Left 2017   BREAST LUMPECTOMY WITH RADIOACTIVE SEED LOCALIZATION Left 02/27/2016   Procedure: LEFT BREAST LUMPECTOMY WITH RADIOACTIVE SEED LOCALIZATION;  Surgeon: Autumn Messing III, MD;  Location: [facility];  Service: General;  Laterality: Left;   BREAST LUMPECTOMY WITH SENTINEL LYMPH NODE BIOPSY Left 03/12/2016   Procedure: RE-EXCISION OF LEFT BREAST SUPERIOR  AND MEDIAL MARGIN  WITH SENTINEL LYMPH NODE BX;  Surgeon: Autumn Messing III, MD;  Location: [facility];  Service: General;  Laterality: Left;   CHOLECYSTECTOMY     COLON SURGERY     GASTROSTOMY N/A 10/04/2014   Procedure: GASTROSTOMY;  Surgeon: Autumn Messing III, MD;  [facility];  Service: General;  Laterality: N/A;   PARTIAL COLECTOMY     VENTRAL HERNIA REPAIR N/A 10/04/2014   Procedure: DIAPHRAGMATIC  HERNIA REPAIR;  Surgeon: Autumn Messing III, MD;  [facility];  Service: General;  Laterality: N/A;    There were no vitals filed for this visit.    Subjective Assessment - 07/21/21 0800     Subjective I started to get swelling  in the hand and forearm about a week ago.  The palm of my hand on the small  finger side was swollen too. it is better now though.  Started getting some left shoulder pain a few weeks ago.  She woke up with it hurting. It is better now, but still hurts when she lays on it. She has trouble doing her seatbelt with the left arm, and with reaching feels tightness under the armpit. Shoulder pain at worst is 6/10 and intermittent    Pertinent History Pt had a left lumpectomy on 02/27/2016 and a re-excision performed on 03/12/2016 with 0/2 LN's .  She also had radiation and was on anastrazole for 5 years.    Patient Stated Goals to reduce swelling in the left arm, decrease left shoulder pain    Currently in Pain? Yes    Pain Score 6    only with movement   Pain Location Shoulder    Pain Orientation Left;Proximal    Pain Descriptors / Indicators Sharp;Tightness    Pain Type Acute pain  Pain Onset 1 to 4 weeks ago    Pain Frequency Intermittent    Aggravating Factors  reaching up, and behind, fastening seat belt, reaching to cabinets, sleeping on left    Pain Relieving Factors rest    Effect of Pain on Daily Activities putting on seat belt, coat, sleeping on left, reaching are all hard    Multiple Pain Sites No                [facility] PT Assessment - 07/21/21 0001       Assessment   Medical Diagnosis Left breast CA/lymphedema    Referring Provider (PT) Wilber Bihari    Onset Date/Surgical Date 02/27/16    Hand Dominance Left      Precautions   Precaution Comments lymphedema      Restrictions   Weight Bearing Restrictions No      Balance Screen   Has the patient fallen in the past 6 months No    Has the patient had a decrease in activity level because of a  fear of falling?  No    Is the patient reluctant to leave their home because of a fear of falling?  No      Home Environment   Living Environment Private residence    Living Arrangements Spouse/significant other    Available Help at Discharge Family      Prior Function   Level of [place] Retired    Leisure church cooking, occasional walking      Cognition   Overall Cognitive Status Within Functional Limits for tasks assessed      Posture/Postural Control   Posture/Postural Control Postural limitations    Postural Limitations Rounded Shoulders;Forward head      AROM   Right Shoulder Extension 70 Degrees    Right Shoulder Flexion 125 Degrees    Right Shoulder ABduction 130 Degrees    Left Shoulder Extension 65 Degrees   pulls in  shoulder   Left Shoulder Flexion 136 Degrees    Left Shoulder ABduction 125 Degrees   pain with lowering it.     Strength   Left Shoulder Flexion 4-/5   pain with resistance   Left Shoulder ABduction 4-/5   pain with resistance   Left Shoulder Internal Rotation 4/5   no pain   Left Shoulder External Rotation 3+/5   pain with resistance     Hawkins-Kennedy test   Findings Positive               LYMPHEDEMA/ONCOLOGY QUESTIONNAIRE - 07/21/21 0001       Type   Cancer Type left breast       Surgeries   Lumpectomy Date 02/27/16    Other Surgery Date 03/12/16   re-excision, had seroma      Date Lymphedema/Swelling Started   Date 07/11/21      Treatment   Active Chemotherapy Treatment No    Past Chemotherapy Treatment No    Active Radiation Treatment No    Past Radiation Treatment Yes    Date 05/18/16      What other symptoms do you have   Are you Having Heaviness or Tightness No    Are you having Pain Yes   left shoulder/sometimes left wrist   Are you having pitting edema No    Is it Hard or Difficult finding clothes that fit No      Right Upper Extremity Lymphedema   15 cm Proximal to Olecranon  Process  37.1 cm    10 cm Proximal to Olecranon Process 37.4 cm    Olecranon Process 29.3 cm    15 cm Proximal to Ulnar Styloid Process 28.8 cm    10 cm Proximal to Ulnar Styloid Process 25.3 cm    Just Proximal to Ulnar Styloid Process 19.6 cm    Across Hand at [employer] 20.6 cm    At [place] of 2nd Digit 6.6 cm      Left Upper Extremity Lymphedema   15 cm Proximal to Olecranon Process 35.4 cm    10 cm Proximal to Olecranon Process 36.5 cm    Olecranon Process 29.7 cm    15 cm Proximal to Ulnar Styloid Process 28.6 cm    10 cm Proximal to Ulnar Styloid Process 25.4 cm    Just Proximal to Ulnar Styloid Process 19 cm    Across Hand at [employer] 20.3 cm    At [place] of 2nd Digit 6.9 cm                   Quick Dash - 07/21/21 0001     Open a tight or new jar Mild difficulty    Do heavy household chores (wash walls, wash floors) Mild difficulty    Carry a shopping bag or briefcase Mild difficulty    Wash your back Severe difficulty    Use a knife to cut food Mild difficulty    Recreational activities in which you take some force or impact through your arm, shoulder, or hand (golf, hammering, tennis) Severe difficulty    During the past week, to what extent has your arm, shoulder or hand problem interfered with your normal social activities with family, friends, neighbors, or groups? Slightly    During the past week, to what extent has your arm, shoulder or hand problem limited your work or other regular daily activities Slightly    Arm, shoulder, or hand pain. Moderate    Tingling (pins and needles) in your arm, shoulder, or hand Mild    Difficulty Sleeping Moderate difficulty    DASH Score 38.64 %              Objective measurements completed on examination: See above findings.                     PT Long Term Goals - 07/21/21 1004       PT LONG TERM GOAL #1   Title Pt will report overall decreased pain in left shoulder by 50%      Baseline 6/10 with reaching, sleeping    Time 6    Period Weeks    Target Date 09/01/21      PT LONG TERM GOAL #2   Title Pt will be independent in self MLD to the left UE for control of swelling when present    Time 6    Period Weeks    Status New    Target Date 09/01/21      PT LONG TERM GOAL #3   Title Pt will be independent in a home exercise program for shoulder range of motion and strength     Time 4    Period Weeks    Status New    Target Date 09/01/21      PT LONG TERM GOAL #4   Title Pt will improve Quick DASH score to < 15% indicating and improvement in functional use of upper body    Baseline  38% 07/21/2021    Time 6    Period Weeks    Status New    Target Date 09/01/21      PT LONG TERM GOAL #5   Title pt will be fit for compression sleeve and glove for control of lymphedema    Time 6    Period Weeks    Status New    Target Date 09/01/21      Additional Long Term Goals   Additional Long Term Goals Yes      PT LONG TERM GOAL #6   Title Pt will be able to fasten seat belt and don coat without increased shoulder pain    Time 6    Period Weeks    Status New    Target Date 09/01/21                    Plan - 07/21/21 0854     Clinical Impression Statement Pt presents today with new complaint of left UE swelling that started last week and was present for a number of days and now seems to be resolved.  Swelling was in the left UE, predominantly the forearm and hand.  Measurements today do not reveal any significant difference and there is no visualized swelling.  We will continue to monitor this and she is presently set up for Feb 1 to be measured for a flat knit sleeve and glove through [facility].  She is also complaining of a new onset of left shoulder pain which is interfering with sleep, reaching, and household chores.  She has a positive impingement test and pain is reproduced with resisted testing. She will benefit from monitoring the left UE swelling,  while performing strength and ROM activities for the left shoulder. She was advised to contact me if swelling returns before she is scheduled to see [place] again.    Personal Factors and Comorbidities Comorbidity 2    Comorbidities Left breast CA, Kidney disease, hypertension    Examination-Activity Limitations Reach Overhead;Carry;Sleep;Dressing;Lift    Stability/Clinical Decision Making Stable/Uncomplicated    Clinical Decision Making Low    Rehab Potential Good    Clinical Impairments Affecting Rehab Potential previous radiation     PT Frequency 2x / week    PT Duration 6 weeks    PT Treatment/Interventions ADLs/Self Care Home Management;Iontophoresis 4mg /ml Dexamethasone;Scar mobilization;Passive range of motion;Therapeutic activities;Therapeutic exercise;Orthotic Fit/Training;Patient/family education;Manual techniques;Manual lymph drainage;Compression bandaging;Taping    PT Next Visit Plan check left UE for swelling, start treating left shoulder pain with impingment, instruct MLD,(currently scheduled to be measured on Feb 1 for Sleeve/glove with [facility]    Recommended Other Services Sleeve/glove Flat knit    Consulted and Agree with Plan of Care Patient             Patient will benefit from skilled therapeutic intervention in order to improve the following deficits and impairments:  Decreased activity tolerance, Decreased strength, Increased edema, Postural dysfunction, Decreased knowledge of precautions, Pain, Impaired UE functional use, Decreased range of motion  Visit Diagnosis: Malignant neoplasm of upper-outer quadrant of left breast in female, estrogen receptor positive ([place])  Acute pain of left shoulder  Lymphedema, not elsewhere classified     Problem List Patient Active Problem List   Diagnosis Date Noted   Vitamin D deficiency 07/17/2021   Toxic multinodular goiter 07/17/2021   Personal history of colonic polyps 07/17/2021   Hypercholesterolemia 07/17/2021   History  of hyperthyroidism 07/17/2021   Acquired absence of other specified  parts of digestive tract 07/17/2021   Breast cancer of upper-outer quadrant of left female breast ([place]) 03/19/2016   Essential hypertension 10/09/2014   Chronic renal insufficiency 10/09/2014   Bochdalek hernia 10/02/2014    Claris Pong, PT 07/21/2021, 10:09 AM  [facility] @ [place], [place], [address] Phone: [phone]   Fax:  [phone]  Name: [name] MRN: [id number] Date of Birth: [birth year]

## 2021-07-23 ENCOUNTER — Ambulatory Visit: Payer: Medicare PPO

## 2021-07-23 ENCOUNTER — Other Ambulatory Visit: Payer: Self-pay

## 2021-07-23 DIAGNOSIS — M25512 Pain in left shoulder: Secondary | ICD-10-CM

## 2021-07-23 DIAGNOSIS — C50412 Malignant neoplasm of upper-outer quadrant of left female breast: Secondary | ICD-10-CM | POA: Diagnosis not present

## 2021-07-23 DIAGNOSIS — I890 Lymphedema, not elsewhere classified: Secondary | ICD-10-CM

## 2021-07-23 DIAGNOSIS — Z170 Estrogen receptor positive status [ER+]: Secondary | ICD-10-CM | POA: Diagnosis not present

## 2021-07-23 NOTE — Therapy (Signed)
[place] @ [place] [place], [place], [address] Phone: [phone]   Fax:  [phone]  Physical Therapy Treatment  Patient Details  Name: [name] MRN: [id number] Date of Birth: [birth year] Referring Provider (PT): Wilber Bihari   Encounter Date: 07/23/2021   PT End of Session - 07/23/21 0856     Visit Number 2    Number of Visits 12    Date for PT Re-Evaluation 09/01/21    Authorization Type [employer]    Authorization - Visit Number 2    Authorization - Number of Visits 12    PT Start Time 0801    PT Stop Time 7412    PT Time Calculation (min) 51 min    Activity Tolerance Patient tolerated treatment well    Behavior During Therapy [facility] for tasks assessed/performed             Past Medical History:  Diagnosis Date   Allergy    Breast cancer ([place]) 01/02/2016   left breast   Chronic renal insufficiency 10/09/2014   Dehydration 10/09/2014   Essential hypertension 10/09/2014   Hypertension    Personal history of radiation therapy    2017 left breast   Pre-diabetes    Wears glasses     Past Surgical History:  Procedure Laterality Date   ABDOMINAL HYSTERECTOMY     BREAST LUMPECTOMY Left 2017   BREAST LUMPECTOMY WITH RADIOACTIVE SEED LOCALIZATION Left 02/27/2016   Procedure: LEFT BREAST LUMPECTOMY WITH RADIOACTIVE SEED LOCALIZATION;  Surgeon: Autumn Messing III, MD;  Location: [place];  Service: General;  Laterality: Left;   BREAST LUMPECTOMY WITH SENTINEL LYMPH NODE BIOPSY Left 03/12/2016   Procedure: RE-EXCISION OF LEFT BREAST SUPERIOR  AND MEDIAL MARGIN  WITH SENTINEL LYMPH NODE BX;  Surgeon: Autumn Messing III, MD;  Location: [place];  Service: General;  Laterality: Left;   CHOLECYSTECTOMY     COLON SURGERY     GASTROSTOMY N/A 10/04/2014   Procedure: GASTROSTOMY;  Surgeon: Autumn Messing III, MD;  [facility];  Service: General;  Laterality: N/A;   PARTIAL COLECTOMY     VENTRAL HERNIA REPAIR N/A  10/04/2014   Procedure: DIAPHRAGMATIC HERNIA REPAIR;  Surgeon: Autumn Messing III, MD;  [facility];  Service: General;  Laterality: N/A;    There were no vitals filed for this visit.   Subjective Assessment - 07/23/21 0800     Subjective i dont think my arm is swollen. It has felt good for several days. shoulder is not presently hurting either.    Pertinent History Pt had a left lumpectomy on 02/27/2016 and a re-excision performed on 03/12/2016 with 0/2 LN's .  She also had radiation and was on anastrazole for 5 years.    Patient Stated Goals to reduce swelling in the left arm, decrease left shoulder pain    Currently in Pain? Yes    Pain Score 0-No pain                               [facility] Adult PT Treatment/Exercise - 07/23/21 0001       Exercises   Other Exercises  alternating isometrics IR/ER 2 x 30, rhythmic stabs at 90 degrees shoulder flexion 2 x 30 sec with manual resistance     Shoulder Exercises: Supine   Protraction AROM;Left;15 reps    Protraction Weight (lbs) 0    Other Supine Exercises supine wand  flex and scaption x 5    Other Supine Exercises supine alphabet x 2      Shoulder Exercises: Sidelying   External Rotation AROM;Strengthening;Left;12 reps    External Rotation Weight (lbs) 0      Manual Therapy   Soft tissue mobilization to left mid deltoid region of increased muscle tension                     PT Education - 07/23/21 0855     Education Details supine wand flexion and scaption, supine alphabet    Person(s) Educated Patient    Methods Explanation;Handout;Demonstration    Comprehension Returned demonstration                 PT Long Term Goals - 07/21/21 1004       PT LONG TERM GOAL #1   Title Pt will report overall decreased pain in left shoulder by 50%     Baseline 6/10 with reaching, sleeping    Time 6    Period Weeks    Target Date 09/01/21      PT LONG TERM GOAL #2   Title Pt will be independent in  self MLD to the left UE for control of swelling when present    Time 6    Period Weeks    Status New    Target Date 09/01/21      PT LONG TERM GOAL #3   Title Pt will be independent in a home exercise program for shoulder range of motion and strength     Time 4    Period Weeks    Status New    Target Date 09/01/21      PT LONG TERM GOAL #4   Title Pt will improve Quick DASH score to < 15% indicating and improvement in functional use of upper body    Baseline 38% 07/21/2021    Time 6    Period Weeks    Status New    Target Date 09/01/21      PT LONG TERM GOAL #5   Title pt will be fit for compression sleeve and glove for control of lymphedema    Time 6    Period Weeks    Status New    Target Date 09/01/21      Additional Long Term Goals   Additional Long Term Goals Yes      PT LONG TERM GOAL #6   Title Pt will be able to fasten seat belt and don coat without increased shoulder pain    Time 6    Period Weeks    Status New    Target Date 09/01/21                   Plan - 07/23/21 0856     Clinical Impression Statement Pts arm assessed with no visible swelling in arm or hand. Initiated ROM , strength,stabilization and soft tissue mobilization to address left shoulder pain. Pt felt pulling in appropriate places with supine wand. on 1 occasion experienced mild pain that corrected when she depressed her scapula.  She was able to perform exercises without pain, but required VC's and tactile cues to do alphabet stabs correctly.  Updated HEP    Personal Factors and Comorbidities Comorbidity 2    Comorbidities Left breast CA, Kidney disease, hypertension    Examination-Activity Limitations Reach Overhead;Carry;Sleep;Dressing;Lift    Stability/Clinical Decision Making Stable/Uncomplicated    Rehab Potential Good    Clinical  Impairments Affecting Rehab Potential previous radiation     PT Frequency 2x / week    PT Duration 6 weeks    PT Treatment/Interventions ADLs/Self  Care Home Management;Iontophoresis 4mg /ml Dexamethasone;Scar mobilization;Passive range of motion;Therapeutic activities;Therapeutic exercise;Orthotic Fit/Training;Patient/family education;Manual techniques;Manual lymph drainage;Compression bandaging;Taping    PT Next Visit Plan check left UE for swelling, start treating left shoulder pain with impingment, instruct MLD,(currently scheduled to be measured on Feb 1 for Sleeve/glove with [employer]    Recommended Other Services scheduled Feb 1 for sleeve fitting    Consulted and Agree with Plan of Care Patient             Patient will benefit from skilled therapeutic intervention in order to improve the following deficits and impairments:  Decreased activity tolerance, Decreased strength, Increased edema, Postural dysfunction, Decreased knowledge of precautions, Pain, Impaired UE functional use, Decreased range of motion  Visit Diagnosis: Malignant neoplasm of upper-outer quadrant of left breast in female, estrogen receptor positive ([place])  Acute pain of left shoulder  Lymphedema, not elsewhere classified     Problem List Patient Active Problem List   Diagnosis Date Noted   Vitamin D deficiency 07/17/2021   Toxic multinodular goiter 07/17/2021   Personal history of colonic polyps 07/17/2021   Hypercholesterolemia 07/17/2021   History of hyperthyroidism 07/17/2021   Acquired absence of other specified parts of digestive tract 07/17/2021   Breast cancer of upper-outer quadrant of left female breast ([place]) 03/19/2016   Essential hypertension 10/09/2014   Chronic renal insufficiency 10/09/2014   [place]dale[place] hernia 10/02/2014    Claris Pong, PT 07/23/2021, 9:01 AM  [place] @ [place], [place], [address] Phone: [phone]   Fax:  [phone]  Name: [name] MRN: [id number] Date of Birth: June 03, 1941

## 2021-07-23 NOTE — Patient Instructions (Signed)
SHOULDER: Flexion - Supine (Cane)        Cancer Rehab [phone]    Hold cane in both hands. Raise arms up overhead. Do not allow back to arch. Hold _5__ seconds. Do __3__ times; __1__ times a day.   Hands shoulder width apart  2. Hands slightly wider than shoulder width     Shoulder alphabet: lie on back; arm straight up above shoulder.  Keep wrist still and use shoulder motions to write the alphabet. A- Z

## 2021-07-29 ENCOUNTER — Other Ambulatory Visit: Payer: Self-pay

## 2021-07-29 ENCOUNTER — Ambulatory Visit: Payer: Medicare PPO

## 2021-07-29 DIAGNOSIS — C50412 Malignant neoplasm of upper-outer quadrant of left female breast: Secondary | ICD-10-CM

## 2021-07-29 DIAGNOSIS — M25512 Pain in left shoulder: Secondary | ICD-10-CM

## 2021-07-29 DIAGNOSIS — Z170 Estrogen receptor positive status [ER+]: Secondary | ICD-10-CM | POA: Diagnosis not present

## 2021-07-29 DIAGNOSIS — I890 Lymphedema, not elsewhere classified: Secondary | ICD-10-CM | POA: Diagnosis not present

## 2021-07-29 NOTE — Therapy (Signed)
[facility] @ [place] [address] [place], [place], [address] Phone: [phone]   Fax:  [phone]  Physical Therapy Treatment  Patient Details  Name: [name] MRN: [id number] Date of Birth: [birth year] Referring Provider (PT): Wilber Bihari   Encounter Date: 07/29/2021   PT End of Session - 07/29/21 0922     Visit Number 3    Number of Visits 12    Date for PT Re-Evaluation 09/01/21    Authorization Type [employer]    Authorization - Visit Number 3    Authorization - Number of Visits 12    PT Start Time 0900    PT Stop Time 6962    PT Time Calculation (min) 53 min    Activity Tolerance Patient tolerated treatment well    Behavior During Therapy [facility] for tasks assessed/performed             Past Medical History:  Diagnosis Date   Allergy    Breast cancer ([place]) 01/02/2016   left breast   Chronic renal insufficiency 10/09/2014   Dehydration 10/09/2014   Essential hypertension 10/09/2014   Hypertension    Personal history of radiation therapy    2017 left breast   Pre-diabetes    Wears glasses     Past Surgical History:  Procedure Laterality Date   ABDOMINAL HYSTERECTOMY     BREAST LUMPECTOMY Left 2017   BREAST LUMPECTOMY WITH RADIOACTIVE SEED LOCALIZATION Left 02/27/2016   Procedure: LEFT BREAST LUMPECTOMY WITH RADIOACTIVE SEED LOCALIZATION;  Surgeon: Autumn Messing III, MD;  Location: [place];  Service: General;  Laterality: Left;   BREAST LUMPECTOMY WITH SENTINEL LYMPH NODE BIOPSY Left 03/12/2016   Procedure: RE-EXCISION OF LEFT BREAST SUPERIOR  AND MEDIAL MARGIN  WITH SENTINEL LYMPH NODE BX;  Surgeon: Autumn Messing III, MD;  Location: [place];  Service: General;  Laterality: Left;   CHOLECYSTECTOMY     COLON SURGERY     GASTROSTOMY N/A 10/04/2014   Procedure: GASTROSTOMY;  Surgeon: Autumn Messing III, MD;  [facility];  Service: General;  Laterality: N/A;   PARTIAL COLECTOMY     VENTRAL HERNIA REPAIR N/A  10/04/2014   Procedure: DIAPHRAGMATIC HERNIA REPAIR;  Surgeon: Autumn Messing III, MD;  [facility];  Service: General;  Laterality: N/A;    There were no vitals filed for this visit.   Subjective Assessment - 07/29/21 0859     Subjective I Had a hard time keeping my shoulder down with the alphabet.  I have not noticed any swelling in my left arm recently. My shoulder felt really good after last visit.  No pain yesterday and overall it feels better.  I have been able to sleep on the left some too lately.    Patient is accompained by: Family member    Pertinent History Pt had a left lumpectomy on 02/27/2016 and a re-excision performed on 03/12/2016 with 0/2 LN's .  She also had radiation and was on anastrazole for 5 years. I can    Patient Stated Goals to reduce swelling in the left arm, decrease left shoulder pain    Currently in Pain? No/denies    Pain Score 0-No pain                                         PT Education - 07/29/21 0955     Education Details  yellow band horizontal abd, and bilateral ER x 5 reps ea    Person(s) Educated Patient    Methods Explanation;Handout    Comprehension Returned demonstration;Other (comment)   needs review next visit                PT Long Term Goals - 07/21/21 1004       PT LONG TERM GOAL #1   Title Pt will report overall decreased pain in left shoulder by 50%     Baseline 6/10 with reaching, sleeping    Time 6    Period Weeks    Target Date 09/01/21      PT LONG TERM GOAL #2   Title Pt will be independent in self MLD to the left UE for control of swelling when present    Time 6    Period Weeks    Status New    Target Date 09/01/21      PT LONG TERM GOAL #3   Title Pt will be independent in a home exercise program for shoulder range of motion and strength     Time 4    Period Weeks    Status New    Target Date 09/01/21      PT LONG TERM GOAL #4   Title Pt will improve Quick DASH score to < 15%  indicating and improvement in functional use of upper body    Baseline 38% 07/21/2021    Time 6    Period Weeks    Status New    Target Date 09/01/21      PT LONG TERM GOAL #5   Title pt will be fit for compression sleeve and glove for control of lymphedema    Time 6    Period Weeks    Status New    Target Date 09/01/21      Additional Long Term Goals   Additional Long Term Goals Yes      PT LONG TERM GOAL #6   Title Pt will be able to fasten seat belt and don coat without increased shoulder pain    Time 6    Period Weeks    Status New    Target Date 09/01/21                   Plan - 07/29/21 0960     Clinical Impression Statement Pt reports noticeably less shoulder pain, and she is even able to sleep some on her left without pain.  There is no visible edema today.  She will be measured for compression sleeve and glove tomorrow to address prior and potential swelling.  She required occasional VCs for proper form with alphabet, and was reminded to do only the number of repetitions noted by PT with band exercises.    Personal Factors and Comorbidities Comorbidity 2    Comorbidities Left breast CA, Kidney disease, hypertension    Examination-Activity Limitations Reach Overhead;Carry;Sleep;Dressing;Lift    Stability/Clinical Decision Making Stable/Uncomplicated    Rehab Potential Good    Clinical Impairments Affecting Rehab Potential previous radiation     PT Frequency 2x / week    PT Duration 6 weeks    PT Treatment/Interventions ADLs/Self Care Home Management;Iontophoresis 4mg /ml Dexamethasone;Scar mobilization;Passive range of motion;Therapeutic activities;Therapeutic exercise;Orthotic Fit/Training;Patient/family education;Manual techniques;Manual lymph drainage;Compression bandaging;Taping    PT Next Visit Plan check left UE for swelling, start treating left shoulder pain with impingment, instruct MLD,(currently scheduled to be measured on Feb 1 for Sleeve/glove with  [place]  Consulted and Agree with Plan of Care Patient             Patient will benefit from skilled therapeutic intervention in order to improve the following deficits and impairments:  Decreased activity tolerance, Decreased strength, Increased edema, Postural dysfunction, Decreased knowledge of precautions, Pain, Impaired UE functional use, Decreased range of motion  Visit Diagnosis: Malignant neoplasm of upper-outer quadrant of left breast in female, estrogen receptor positive (HCC)  Acute pain of left shoulder  Lymphedema, not elsewhere classified     Problem List Patient Active Problem List   Diagnosis Date Noted   Vitamin D deficiency 07/17/2021   Toxic multinodular goiter 07/17/2021   Personal history of colonic polyps 07/17/2021   Hypercholesterolemia 07/17/2021   History of hyperthyroidism 07/17/2021   Acquired absence of other specified parts of digestive tract 07/17/2021   Breast cancer of upper-outer quadrant of left female breast ([place]) 03/19/2016   Essential hypertension 10/09/2014   Chronic renal insufficiency 10/09/2014   [place] hernia 10/02/2014    Claris Pong, PT 07/29/2021, 9:57 AM  [place] [facility] @ [place], [place], [address] Phone: [phone]   Fax:  [phone]  Name: [name] MRN: [id number] Date of Birth: February 13, 1941

## 2021-07-29 NOTE — Patient Instructions (Signed)
Side Pull: Double Arm   On back, knees bent, feet flat. Arms perpendicular to body, shoulder level, elbows straight but relaxed. Pull arms out to sides, elbows straight. Resistance band comes across collarbones, hands toward floor. Hold momentarily. Slowly return to starting position. Repeat _5-10__ times. Band color _yellow____   Shoulder Rotation: Double Arm   On back, knees bent, feet flat, elbows tucked at sides, bent 90, hands palms up. Pull hands apart and down toward floor, keeping elbows near sides. Hold momentarily. Slowly return to starting position. Repeat _5-10__ times. Band color __yellow____    

## 2021-07-30 ENCOUNTER — Ambulatory Visit (HOSPITAL_COMMUNITY)
Admission: RE | Admit: 2021-07-30 | Discharge: 2021-07-30 | Disposition: A | Discharge status: Home / Self Care | Payer: Medicare PPO | Source: Ambulatory Visit | Attending: Adult Health | Admitting: Adult Health

## 2021-07-30 ENCOUNTER — Encounter (HOSPITAL_COMMUNITY): Payer: Self-pay

## 2021-07-30 DIAGNOSIS — R911 Solitary pulmonary nodule: Secondary | ICD-10-CM | POA: Diagnosis not present

## 2021-07-30 DIAGNOSIS — R918 Other nonspecific abnormal finding of lung field: Secondary | ICD-10-CM | POA: Diagnosis not present

## 2021-07-30 DIAGNOSIS — C50412 Malignant neoplasm of upper-outer quadrant of left female breast: Secondary | ICD-10-CM | POA: Insufficient documentation

## 2021-07-30 DIAGNOSIS — I700 Atherosclerosis of aorta: Secondary | ICD-10-CM | POA: Diagnosis not present

## 2021-07-30 DIAGNOSIS — Z170 Estrogen receptor positive status [ER+]: Secondary | ICD-10-CM | POA: Diagnosis not present

## 2021-07-30 DIAGNOSIS — R079 Chest pain, unspecified: Secondary | ICD-10-CM | POA: Diagnosis not present

## 2021-07-31 ENCOUNTER — Other Ambulatory Visit: Payer: Self-pay

## 2021-07-31 ENCOUNTER — Ambulatory Visit: Payer: Medicare PPO | Attending: Adult Health | Admitting: Physical Therapy

## 2021-07-31 DIAGNOSIS — C50412 Malignant neoplasm of upper-outer quadrant of left female breast: Secondary | ICD-10-CM | POA: Insufficient documentation

## 2021-07-31 DIAGNOSIS — M25512 Pain in left shoulder: Secondary | ICD-10-CM

## 2021-07-31 DIAGNOSIS — I890 Lymphedema, not elsewhere classified: Secondary | ICD-10-CM

## 2021-07-31 DIAGNOSIS — Z170 Estrogen receptor positive status [ER+]: Secondary | ICD-10-CM | POA: Diagnosis not present

## 2021-07-31 NOTE — Therapy (Signed)
[facility] [place] @ [place], [place], [address] Phone: [phone]   Fax:  [phone]  Physical Therapy Treatment  Patient Details  Name: [name] MRN: [id number] Date of Birth: [birth year] Referring Provider (PT): Wilber Bihari   Encounter Date: 07/31/2021   PT End of Session - 07/31/21 1143     Visit Number 4    Number of Visits 12    Date for PT Re-Evaluation 09/01/21    Authorization Type [employer]    Authorization - Visit Number 3    Authorization - Number of Visits 12    PT Start Time 1100    PT Stop Time 6222    PT Time Calculation (min) 43 min    Activity Tolerance Patient tolerated treatment well    Behavior During Therapy [facility] for tasks assessed/performed             Past Medical History:  Diagnosis Date   Allergy    Breast cancer ([place]) 01/02/2016   left breast   Chronic renal insufficiency 10/09/2014   Dehydration 10/09/2014   Essential hypertension 10/09/2014   Hypertension    Personal history of radiation therapy    2017 left breast   Pre-diabetes    Wears glasses     Past Surgical History:  Procedure Laterality Date   ABDOMINAL HYSTERECTOMY     BREAST LUMPECTOMY Left 2017   BREAST LUMPECTOMY WITH RADIOACTIVE SEED LOCALIZATION Left 02/27/2016   Procedure: LEFT BREAST LUMPECTOMY WITH RADIOACTIVE SEED LOCALIZATION;  Surgeon: Autumn Messing III, MD;  Location: [place];  Service: General;  Laterality: Left;   BREAST LUMPECTOMY WITH SENTINEL LYMPH NODE BIOPSY Left 03/12/2016   Procedure: RE-EXCISION OF LEFT BREAST SUPERIOR  AND MEDIAL MARGIN  WITH SENTINEL LYMPH NODE BX;  Surgeon: Autumn Messing III, MD;  Location: [place];  Service: General;  Laterality: Left;   CHOLECYSTECTOMY     COLON SURGERY     GASTROSTOMY N/A 10/04/2014   Procedure: GASTROSTOMY;  Surgeon: Autumn Messing III, MD;  [facility];  Service: General;  Laterality: N/A;   PARTIAL COLECTOMY     VENTRAL HERNIA REPAIR N/A  10/04/2014   Procedure: DIAPHRAGMATIC HERNIA REPAIR;  Surgeon: Autumn Messing III, MD;  [facility];  Service: General;  Laterality: N/A;    There were no vitals filed for this visit.   Subjective Assessment - 07/31/21 1107     Subjective Pt says that she she could not get measured for her sleeve yesterday as the fitter had an emergency.  She had a CT scan of her chest yesterday    Pertinent History Pt had a left lumpectomy on 02/27/2016 and a re-excision performed on 03/12/2016 with 0/2 LN's .  She also had radiation and was on anastrazole for 5 years and has completed that    Patient Stated Goals to reduce swelling in the left arm, decrease left shoulder pain    Currently in Pain? No/denies                   LYMPHEDEMA/ONCOLOGY QUESTIONNAIRE - 07/31/21 0001       Left Upper Extremity Lymphedema   15 cm Proximal to Olecranon Process 34.5 cm    10 cm Proximal to Olecranon Process 36.5 cm    Olecranon Process 28.5 cm    15 cm Proximal to Ulnar Styloid Process 28.5 cm    10 cm Proximal to Ulnar Styloid Process 25.5 cm    Just Proximal  to Ulnar Styloid Process 17.6 cm    Across Hand at [employer] 19.5 cm    At [place] of 2nd Digit 6.4 cm                        [facility] Adult PT Treatment/Exercise - 07/31/21 0001       Shoulder Exercises: Supine   Protraction AROM;Right;Left;10 reps    Horizontal ABduction AROM;Left;10 reps    External Rotation AROM;Left;10 reps    Diagonals AROM;Left;10 reps    Other Supine Exercises manual isometrics in all directions with arm pointed to ceiling      Shoulder Exercises: Standing   Retraction Strengthening;Right;Left;10 reps;Theraband    Theraband Level (Shoulder Retraction) Level 2 (Red)      Manual Therapy   Manual Therapy Manual Lymphatic Drainage (MLD);Edema management    Manual therapy comments remeasured arm, assured pt someone would call her to reschedule measuring her sleeve    Manual Lymphatic Drainage (MLD)  briefly to left forearm and upper arm                          PT Long Term Goals - 07/21/21 1004       PT LONG TERM GOAL #1   Title Pt will report overall decreased pain in left shoulder by 50%     Baseline 6/10 with reaching, sleeping    Time 6    Period Weeks    Target Date 09/01/21      PT LONG TERM GOAL #2   Title Pt will be independent in self MLD to the left UE for control of swelling when present    Time 6    Period Weeks    Status New    Target Date 09/01/21      PT LONG TERM GOAL #3   Title Pt will be independent in a home exercise program for shoulder range of motion and strength     Time 4    Period Weeks    Status New    Target Date 09/01/21      PT LONG TERM GOAL #4   Title Pt will improve Quick DASH score to < 15% indicating and improvement in functional use of upper body    Baseline 38% 07/21/2021    Time 6    Period Weeks    Status New    Target Date 09/01/21      PT LONG TERM GOAL #5   Title pt will be fit for compression sleeve and glove for control of lymphedema    Time 6    Period Weeks    Status New    Target Date 09/01/21      Additional Long Term Goals   Additional Long Term Goals Yes      PT LONG TERM GOAL #6   Title Pt will be able to fasten seat belt and don coat without increased shoulder pain    Time 6    Period Weeks    Status New    Target Date 09/01/21                   Plan - 07/31/21 1152     Clinical Impression Statement Pt reports she is doing much better.  She has been doing her exercises at home and feels that her shoulder pain and arm swelling is reduced. She is waiting to get results of CT scan she  had yesterday and also to get her sleeve/glove fitted    Personal Factors and Comorbidities Comorbidity 2    Comorbidities Left breast CA, Kidney disease, hypertension    Examination-Activity Limitations Reach Overhead;Carry;Sleep;Dressing;Lift    Stability/Clinical Decision Making  Stable/Uncomplicated    Clinical Decision Making Low    Rehab Potential Good    Clinical Impairments Affecting Rehab Potential previous radiation     PT Frequency 2x / week    PT Duration 6 weeks    PT Treatment/Interventions ADLs/Self Care Home Management;Iontophoresis 4mg /ml Dexamethasone;Scar mobilization;Passive range of motion;Therapeutic activities;Therapeutic exercise;Orthotic Fit/Training;Patient/family education;Manual techniques;Manual lymph drainage;Compression bandaging;Taping    PT Next Visit Plan check left UE for swelling and see if pt heard about sleeve fitting. continue with treating left shoulder pain with impingment, instruct in self MLD, Discharge soon??    Consulted and Agree with Plan of Care Patient             Patient will benefit from skilled therapeutic intervention in order to improve the following deficits and impairments:  Decreased activity tolerance, Decreased strength, Increased edema, Postural dysfunction, Decreased knowledge of precautions, Pain, Impaired UE functional use, Decreased range of motion  Visit Diagnosis: Malignant neoplasm of upper-outer quadrant of left breast in female, estrogen receptor positive (HCC)  Acute pain of left shoulder  Lymphedema, not elsewhere classified     Problem List Patient Active Problem List   Diagnosis Date Noted   Vitamin D deficiency 07/17/2021   Toxic multinodular goiter 07/17/2021   Personal history of colonic polyps 07/17/2021   Hypercholesterolemia 07/17/2021   History of hyperthyroidism 07/17/2021   Acquired absence of other specified parts of digestive tract 07/17/2021   Breast cancer of upper-outer quadrant of left female breast ([place]) 03/19/2016   Essential hypertension 10/09/2014   Chronic renal insufficiency 10/09/2014   [place] hernia 10/02/2014   Donato Heinz. Owens Shark PT  Norwood Levo, PT 07/31/2021, 11:57 AM  [place] @ [place]  [place], [place], [address] Phone: [phone]   Fax:  [phone]  Name: [name] MRN: [id number] Date of Birth: 02-09-1941

## 2021-08-05 DIAGNOSIS — H2511 Age-related nuclear cataract, right eye: Secondary | ICD-10-CM | POA: Diagnosis not present

## 2021-08-05 DIAGNOSIS — H18413 Arcus senilis, bilateral: Secondary | ICD-10-CM | POA: Diagnosis not present

## 2021-08-05 DIAGNOSIS — H2513 Age-related nuclear cataract, bilateral: Secondary | ICD-10-CM | POA: Diagnosis not present

## 2021-08-05 DIAGNOSIS — H25013 Cortical age-related cataract, bilateral: Secondary | ICD-10-CM | POA: Diagnosis not present

## 2021-08-05 DIAGNOSIS — H25043 Posterior subcapsular polar age-related cataract, bilateral: Secondary | ICD-10-CM | POA: Diagnosis not present

## 2021-08-07 ENCOUNTER — Other Ambulatory Visit: Payer: Self-pay

## 2021-08-07 ENCOUNTER — Ambulatory Visit: Payer: Medicare PPO

## 2021-08-07 DIAGNOSIS — I890 Lymphedema, not elsewhere classified: Secondary | ICD-10-CM

## 2021-08-07 DIAGNOSIS — Z170 Estrogen receptor positive status [ER+]: Secondary | ICD-10-CM | POA: Diagnosis not present

## 2021-08-07 DIAGNOSIS — C50412 Malignant neoplasm of upper-outer quadrant of left female breast: Secondary | ICD-10-CM

## 2021-08-07 DIAGNOSIS — M25512 Pain in left shoulder: Secondary | ICD-10-CM | POA: Diagnosis not present

## 2021-08-07 NOTE — Patient Instructions (Signed)

## 2021-08-07 NOTE — Therapy (Signed)
[facility] @ [place], [place], [address] Phone: [phone]   Fax:  [phone]  Physical Therapy Treatment  Patient Details  Name: [name] MRN: [id number] Date of Birth: 1940-11-25 Referring Provider (PT): Wilber Bihari   Encounter Date: 08/07/2021   PT End of Session - 08/07/21 0915     Visit Number 5    Number of Visits 12    Date for PT Re-Evaluation 09/01/21    Authorization Type [employer]    Authorization - Visit Number 5    Authorization - Number of Visits 12    PT Start Time 0902    PT Stop Time 9518    PT Time Calculation (min) 44 min    Activity Tolerance Patient tolerated treatment well    Behavior During Therapy [facility] for tasks assessed/performed             Past Medical History:  Diagnosis Date   Allergy    Breast cancer ([place]) 01/02/2016   left breast   Chronic renal insufficiency 10/09/2014   Dehydration 10/09/2014   Essential hypertension 10/09/2014   Hypertension    Personal history of radiation therapy    2017 left breast   Pre-diabetes    Wears glasses     Past Surgical History:  Procedure Laterality Date   ABDOMINAL HYSTERECTOMY     BREAST LUMPECTOMY Left 2017   BREAST LUMPECTOMY WITH RADIOACTIVE SEED LOCALIZATION Left 02/27/2016   Procedure: LEFT BREAST LUMPECTOMY WITH RADIOACTIVE SEED LOCALIZATION;  Surgeon: Autumn Messing III, MD;  Location: [facility];  Service: General;  Laterality: Left;   BREAST LUMPECTOMY WITH SENTINEL LYMPH NODE BIOPSY Left 03/12/2016   Procedure: RE-EXCISION OF LEFT BREAST SUPERIOR  AND MEDIAL MARGIN  WITH SENTINEL LYMPH NODE BX;  Surgeon: Autumn Messing III, MD;  Location: [facility];  Service: General;  Laterality: Left;   CHOLECYSTECTOMY     COLON SURGERY     GASTROSTOMY N/A 10/04/2014   Procedure: GASTROSTOMY;  Surgeon: Autumn Messing III, MD;  [facility];  Service: General;  Laterality: N/A;   PARTIAL COLECTOMY     VENTRAL HERNIA REPAIR N/A  10/04/2014   Procedure: DIAPHRAGMATIC HERNIA REPAIR;  Surgeon: Autumn Messing III, MD;  Location[facility];  Service: General;  Laterality: N/A;    There were no vitals filed for this visit.   Subjective Assessment - 08/07/21 0902     Subjective Will be measured for sleeve on 08/13/2021 at 1:30. I have not noticed any swelling in my arm and my shoulder feels good and isn't tender right now.. The band exercises feel good    Pertinent History Pt had a left lumpectomy on 02/27/2016 and a re-excision performed on 03/12/2016 with 0/2 LN's .  She also had radiation and was on anastrazole for 5 years and has completed that    Patient Stated Goals to reduce swelling in the left arm, decrease left shoulder pain    Currently in Pain? No/denies    Pain Score 0-No pain    Multiple Pain Sites No                               [facility] Adult PT Treatment/Exercise - 08/07/21 0001       Shoulder Exercises: Supine   Protraction AROM;Left;15 reps    Protraction Weight (lbs) 0    Horizontal ABduction Strengthening;Both;10 reps    Theraband Level (Shoulder Horizontal  ABduction) Level 1 (Yellow)    External Rotation AROM;Strengthening;Both;10 reps    Theraband Level (Shoulder External Rotation) Level 1 (Yellow)    Flexion Strengthening;Both;10 reps    Theraband Level (Shoulder Flexion) Level 1 (Yellow)    Diagonals Strengthening;Left;10 reps    Theraband Level (Shoulder Diagonals) Level 1 (Yellow)   thumb up   Other Supine Exercises supine alphabet x 2      Shoulder Exercises: Standing   Retraction Strengthening;Right;Left;10 reps;Theraband    Theraband Level (Shoulder Retraction) Level 2 (Red)      Manual Therapy   Manual Therapy Manual Lymphatic Drainage (MLD);Edema management    Manual Lymphatic Drainage (MLD) MLD initiated to short neck, right axillary and left inguinal LN's, anterior interaxillary pathway, left axillo-inguinal pathway, left lateral upper arm, medial to lateral, and lateral  arm again retracing pathways, then both sides of forearm and retracing all steps and ending with LN's. Educated pt on technique of stretch versus slide, gentle nature of technique and showed pics of pathways and lymphatics                     PT Education - 08/07/21 0920     Education Details supine scapular stabs all x 5 reps    Person(s) Educated Patient    Methods Handout    Comprehension Returned demonstration                 PT Long Term Goals - 07/21/21 1004       PT LONG TERM GOAL #1   Title Pt will report overall decreased pain in left shoulder by 50%     Baseline 6/10 with reaching, sleeping    Time 6    Period Weeks    Target Date 09/01/21      PT LONG TERM GOAL #2   Title Pt will be independent in self MLD to the left UE for control of swelling when present    Time 6    Period Weeks    Status New    Target Date 09/01/21      PT LONG TERM GOAL #3   Title Pt will be independent in a home exercise program for shoulder range of motion and strength     Time 4    Period Weeks    Status New    Target Date 09/01/21      PT LONG TERM GOAL #4   Title Pt will improve Quick DASH score to < 15% indicating and improvement in functional use of upper body    Baseline 38% 07/21/2021    Time 6    Period Weeks    Status New    Target Date 09/01/21      PT LONG TERM GOAL #5   Title pt will be fit for compression sleeve and glove for control of lymphedema    Time 6    Period Weeks    Status New    Target Date 09/01/21      Additional Long Term Goals   Additional Long Term Goals Yes      PT LONG TERM GOAL #6   Title Pt will be able to fasten seat belt and don coat without increased shoulder pain    Time 6    Period Weeks    Status New    Target Date 09/01/21                   Plan - 08/07/21 3976  Clinical Impression Statement Pts shoulder pain is greatly improved as is tenderness.  She has been able to progress strengthening, and  supine scapular series and all exercises were added to HEP. Her CT scan was clean with NED. She will be fit for sleeve next Wed.  Continued UE strengthening and initiated MLD today.  Will start pt instruction next week.    Personal Factors and Comorbidities Comorbidity 2    Comorbidities Left breast CA, Kidney disease, hypertension    Examination-Activity Limitations Reach Overhead;Carry;Sleep;Dressing;Lift    Stability/Clinical Decision Making Stable/Uncomplicated    Rehab Potential Good    Clinical Impairments Affecting Rehab Potential previous radiation     PT Frequency 2x / week    PT Duration 6 weeks    PT Treatment/Interventions ADLs/Self Care Home Management;Iontophoresis 4mg /ml Dexamethasone;Scar mobilization;Passive range of motion;Therapeutic activities;Therapeutic exercise;Orthotic Fit/Training;Patient/family education;Manual techniques;Manual lymph drainage;Compression bandaging;Taping    PT Next Visit Plan teach MLD next in sitting,check left UE for swelling and  sleeve fitting 08/13/21. continue with treating left shoulder pain with impingment, instruct in self MLD, Discharge soon??    Recommended Other Services Feb 15 sleeve fitting    Consulted and Agree with Plan of Care Patient             Patient will benefit from skilled therapeutic intervention in order to improve the following deficits and impairments:  Decreased activity tolerance, Decreased strength, Increased edema, Postural dysfunction, Decreased knowledge of precautions, Pain, Impaired UE functional use, Decreased range of motion  Visit Diagnosis: Malignant neoplasm of upper-outer quadrant of left breast in female, estrogen receptor positive (HCC)  Acute pain of left shoulder  Lymphedema, not elsewhere classified     Problem List Patient Active Problem List   Diagnosis Date Noted   Vitamin D deficiency 07/17/2021   Toxic multinodular goiter 07/17/2021   Personal history of colonic polyps 07/17/2021    Hypercholesterolemia 07/17/2021   History of hyperthyroidism 07/17/2021   Acquired absence of other specified parts of digestive tract 07/17/2021   Breast cancer of upper-outer quadrant of left female breast ([place]) 03/19/2016   Essential hypertension 10/09/2014   Chronic renal insufficiency 10/09/2014   Bochdalek hernia 10/02/2014    Claris Pong, PT 08/07/2021, 9:54 AM  [facility] @ [place], [place], [address] Phone: [phone]   Fax:  [phone]  Name: [name] MRN: [id number] Date of Birth: May 30, 1941

## 2021-08-12 ENCOUNTER — Ambulatory Visit: Payer: Medicare PPO

## 2021-08-12 ENCOUNTER — Other Ambulatory Visit: Payer: Self-pay

## 2021-08-12 DIAGNOSIS — C50412 Malignant neoplasm of upper-outer quadrant of left female breast: Secondary | ICD-10-CM | POA: Diagnosis not present

## 2021-08-12 DIAGNOSIS — M25512 Pain in left shoulder: Secondary | ICD-10-CM

## 2021-08-12 DIAGNOSIS — I890 Lymphedema, not elsewhere classified: Secondary | ICD-10-CM

## 2021-08-12 DIAGNOSIS — Z170 Estrogen receptor positive status [ER+]: Secondary | ICD-10-CM | POA: Diagnosis not present

## 2021-08-12 NOTE — Therapy (Signed)
[facility] @ [place], [place], [address] Phone: [phone]   Fax:  [phone]  Physical Therapy Treatment  Patient Details  Name: [name] MRN: [id number] Date of Birth: 1941-04-14 Referring Provider (PT): Wilber Bihari   Encounter Date: 08/12/2021   PT End of Session - 08/12/21 0917     Visit Number 6    Number of Visits 12    Date for PT Re-Evaluation 09/01/21    Authorization - Visit Number 6    Authorization - Number of Visits 12    PT Start Time 0903    PT Stop Time 0948    PT Time Calculation (min) 45 min    Activity Tolerance Patient tolerated treatment well    Behavior During Therapy [facility] for tasks assessed/performed             Past Medical History:  Diagnosis Date   Allergy    Breast cancer ([place]) 01/02/2016   left breast   Chronic renal insufficiency 10/09/2014   Dehydration 10/09/2014   Essential hypertension 10/09/2014   Hypertension    Personal history of radiation therapy    2017 left breast   Pre-diabetes    Wears glasses     Past Surgical History:  Procedure Laterality Date   ABDOMINAL HYSTERECTOMY     BREAST LUMPECTOMY Left 2017   BREAST LUMPECTOMY WITH RADIOACTIVE SEED LOCALIZATION Left 02/27/2016   Procedure: LEFT BREAST LUMPECTOMY WITH RADIOACTIVE SEED LOCALIZATION;  Surgeon: Autumn Messing III, MD;  Location: [facility];  Service: General;  Laterality: Left;   BREAST LUMPECTOMY WITH SENTINEL LYMPH NODE BIOPSY Left 03/12/2016   Procedure: RE-EXCISION OF LEFT BREAST SUPERIOR  AND MEDIAL MARGIN  WITH SENTINEL LYMPH NODE BX;  Surgeon: Autumn Messing III, MD;  Location: [facility];  Service: General;  Laterality: Left;   CHOLECYSTECTOMY     COLON SURGERY     GASTROSTOMY N/A 10/04/2014   Procedure: GASTROSTOMY;  Surgeon: Autumn Messing III, MD;  [facility];  Service: General;  Laterality: N/A;   PARTIAL COLECTOMY     VENTRAL HERNIA REPAIR N/A 10/04/2014   Procedure:  DIAPHRAGMATIC HERNIA REPAIR;  Surgeon: Autumn Messing III, MD;  [facility];  Service: General;  Laterality: N/A;    There were no vitals filed for this visit.   Subjective Assessment - 08/12/21 0904     Subjective I haven't noticed any swelling lately.  The shoulder is feeling better.  I can reach up better into my cabinets without pain.  When I do have the pain its not as bad. I can reach back to dress without pain.    Currently in Pain? No/denies    Multiple Pain Sites No                               [facility] Adult PT Treatment/Exercise - 08/12/21 0001       Exercises   Other Exercises  alternating isometrics IR/ER 2 x 30, rhythmic stabs at 90 degrees shoulder flexion 2 x 30 sec      Shoulder Exercises: Supine   Horizontal ABduction Strengthening;Both;12 reps    Theraband Level (Shoulder Horizontal ABduction) Level 1 (Yellow)    External Rotation Strengthening;Both;12 reps    Theraband Level (Shoulder External Rotation) Level 1 (Yellow)    Flexion Strengthening;Both;10 reps    Theraband Level (Shoulder Flexion) Level 1 (Yellow)    Diagonals Strengthening;Left;10 reps  Theraband Level (Shoulder Diagonals) Level 1 (Yellow)    Other Supine Exercises supine alphabet x 1 with 1# wt.      Shoulder Exercises: Sidelying   External Rotation Strengthening;Left;10 reps    External Rotation Weight (lbs) 1      Shoulder Exercises: Standing   Extension Strengthening;Both;12 reps    Theraband Level (Shoulder Extension) Level 2 (Red)    Retraction Strengthening;Both;12 reps    Theraband Level (Shoulder Retraction) Level 2 (Red)      Manual Therapy   Manual Therapy Soft tissue mobilization    Soft tissue mobilization to left mid deltoid region of increased muscle tension                          PT Long Term Goals - 07/21/21 1004       PT LONG TERM GOAL #1   Title Pt will report overall decreased pain in left shoulder by 50%     Baseline 6/10  with reaching, sleeping    Time 6    Period Weeks    Target Date 09/01/21      PT LONG TERM GOAL #2   Title Pt will be independent in self MLD to the left UE for control of swelling when present    Time 6    Period Weeks    Status New    Target Date 09/01/21      PT LONG TERM GOAL #3   Title Pt will be independent in a home exercise program for shoulder range of motion and strength     Time 4    Period Weeks    Status New    Target Date 09/01/21      PT LONG TERM GOAL #4   Title Pt will improve Quick DASH score to < 15% indicating and improvement in functional use of upper body    Baseline 38% 07/21/2021    Time 6    Period Weeks    Status New    Target Date 09/01/21      PT LONG TERM GOAL #5   Title pt will be fit for compression sleeve and glove for control of lymphedema    Time 6    Period Weeks    Status New    Target Date 09/01/21      Additional Long Term Goals   Additional Long Term Goals Yes      PT LONG TERM GOAL #6   Title Pt will be able to fasten seat belt and don coat without increased shoulder pain    Time 6    Period Weeks    Status New    Target Date 09/01/21                   Plan - 08/12/21 9509     Clinical Impression Statement Pt is making excellent progress with left shoulder pain.  She is now able to reach and dress without pain, or if she has it more discomfort than pain.  She was able to progress repetitions/ resistance on selected exercises today.  We tried SL abduction AROM but she had mild pain in the shoulder so held this activity.  She felt good after treatment. She is very compliant.  She will come tomorrow for measuring sleeve.    Personal Factors and Comorbidities Comorbidity 2    Comorbidities Left breast CA, Kidney disease, hypertension    Examination-Activity Limitations Reach Overhead;Carry;Sleep;Dressing;Lift  Stability/Clinical Decision Making Stable/Uncomplicated    Rehab Potential Good    Clinical Impairments  Affecting Rehab Potential previous radiation     PT Frequency 2x / week    PT Duration 6 weeks    PT Treatment/Interventions ADLs/Self Care Home Management;Iontophoresis 4mg /ml Dexamethasone;Scar mobilization;Passive range of motion;Therapeutic activities;Therapeutic exercise;Orthotic Fit/Training;Patient/family education;Manual techniques;Manual lymph drainage;Compression bandaging;Taping    PT Next Visit Plan teach MLD next in sitting,check left UE for swelling and  sleeve fitting 08/13/21. continue with treating left shoulder pain with impingment, instruct in self MLD, Discharge soon??    Consulted and Agree with Plan of Care Patient             Patient will benefit from skilled therapeutic intervention in order to improve the following deficits and impairments:  Decreased activity tolerance, Decreased strength, Increased edema, Postural dysfunction, Decreased knowledge of precautions, Pain, Impaired UE functional use, Decreased range of motion  Visit Diagnosis: Malignant neoplasm of upper-outer quadrant of left breast in female, estrogen receptor positive (HCC)  Acute pain of left shoulder  Lymphedema, not elsewhere classified     Problem List Patient Active Problem List   Diagnosis Date Noted   Vitamin D deficiency 07/17/2021   Toxic multinodular goiter 07/17/2021   Personal history of colonic polyps 07/17/2021   Hypercholesterolemia 07/17/2021   History of hyperthyroidism 07/17/2021   Acquired absence of other specified parts of digestive tract 07/17/2021   Breast cancer of upper-outer quadrant of left female breast ([place]) 03/19/2016   Essential hypertension 10/09/2014   Chronic renal insufficiency 10/09/2014   Bochdalek hernia 10/02/2014    Claris Pong, PT 08/12/2021, 9:55 AM  [facility] @ [place], [place], [address] Phone: [phone]   Fax:  [phone]  Name: [name] MRN:  [id number] Date of Birth: [birth year]

## 2021-08-14 ENCOUNTER — Other Ambulatory Visit: Payer: Self-pay

## 2021-08-14 ENCOUNTER — Ambulatory Visit: Payer: Medicare PPO

## 2021-08-14 DIAGNOSIS — Z170 Estrogen receptor positive status [ER+]: Secondary | ICD-10-CM

## 2021-08-14 DIAGNOSIS — C50412 Malignant neoplasm of upper-outer quadrant of left female breast: Secondary | ICD-10-CM

## 2021-08-14 DIAGNOSIS — M25512 Pain in left shoulder: Secondary | ICD-10-CM

## 2021-08-14 DIAGNOSIS — I890 Lymphedema, not elsewhere classified: Secondary | ICD-10-CM

## 2021-08-14 NOTE — Patient Instructions (Signed)
Pt given written instructions for left UE MLD

## 2021-08-14 NOTE — Therapy (Signed)
[facility] @ [place], [place], [address] Phone: [phone]   Fax:  [phone]  Physical Therapy Treatment  Patient Details  Name: [name] MRN: [id number] Date of Birth: 15-Sep-1940 Referring Provider (PT): Wilber Bihari   Encounter Date: 08/14/2021   PT End of Session - 08/14/21 0952     Visit Number 7    Number of Visits 12    Date for PT Re-Evaluation 09/01/21    Authorization Type [employer]    Authorization - Visit Number 7    Authorization - Number of Visits 12    PT Start Time 6314    PT Stop Time 9702    PT Time Calculation (min) 47 min    Activity Tolerance Patient tolerated treatment well    Behavior During Therapy [facility] for tasks assessed/performed             Past Medical History:  Diagnosis Date   Allergy    Breast cancer ([place]) 01/02/2016   left breast   Chronic renal insufficiency 10/09/2014   Dehydration 10/09/2014   Essential hypertension 10/09/2014   Hypertension    Personal history of radiation therapy    2017 left breast   Pre-diabetes    Wears glasses     Past Surgical History:  Procedure Laterality Date   ABDOMINAL HYSTERECTOMY     BREAST LUMPECTOMY Left 2017   BREAST LUMPECTOMY WITH RADIOACTIVE SEED LOCALIZATION Left 02/27/2016   Procedure: LEFT BREAST LUMPECTOMY WITH RADIOACTIVE SEED LOCALIZATION;  Surgeon: Autumn Messing III, MD;  Location: [facility];  Service: General;  Laterality: Left;   BREAST LUMPECTOMY WITH SENTINEL LYMPH NODE BIOPSY Left 03/12/2016   Procedure: RE-EXCISION OF LEFT BREAST SUPERIOR  AND MEDIAL MARGIN  WITH SENTINEL LYMPH NODE BX;  Surgeon: Autumn Messing III, MD;  Location: [facility];  Service: General;  Laterality: Left;   CHOLECYSTECTOMY     COLON SURGERY     GASTROSTOMY N/A 10/04/2014   Procedure: GASTROSTOMY;  Surgeon: Autumn Messing III, MD;  [facility];  Service: General;  Laterality: N/A;   PARTIAL COLECTOMY     VENTRAL HERNIA REPAIR N/A  10/04/2014   Procedure: DIAPHRAGMATIC HERNIA REPAIR;  Surgeon: Autumn Messing III, MD;  [facility];  Service: General;  Laterality: N/A;    There were no vitals filed for this visit.   Subjective Assessment - 08/14/21 0911     Subjective I can fasten my seat belt now without pain,  and can reach into my coat without pain. No present shoulder pain. Garments  should come to my house in about 2weeks    Pertinent History Pt had a left lumpectomy on 02/27/2016 and a re-excision performed on 03/12/2016 with 0/2 LN's .  She also had radiation and was on anastrazole for 5 years and has completed that    Patient Stated Goals to reduce swelling in the left arm, decrease left shoulder pain    Currently in Pain? No/denies    Pain Score 0-No pain                               [facility] Adult PT Treatment/Exercise - 08/14/21 0001       Manual Therapy   Manual Lymphatic Drainage (MLD) MLD instructed to pt. short neck, right axillary and left inguinal LN's, anterior interaxillary pathway, left axillo-inguinal pathway, left lateral upper arm, medial to lateral, and lateral arm again  retracing pathways, then both sides of forearm and retracing all steps and ending with LN's. Educated pt on technique of stretch versus slide, and gentle nature of technique.                     PT Education - 08/14/21 0959     Education Details Left UE MLD    Person(s) Educated Patient    Methods Explanation;Demonstration;Handout    Comprehension Returned demonstration;Need further instruction                 PT Long Term Goals - 07/21/21 1004       PT LONG TERM GOAL #1   Title Pt will report overall decreased pain in left shoulder by 50%     Baseline 6/10 with reaching, sleeping    Time 6    Period Weeks    Target Date 09/01/21      PT LONG TERM GOAL #2   Title Pt will be independent in self MLD to the left UE for control of swelling when present    Time 6    Period Weeks     Status New    Target Date 09/01/21      PT LONG TERM GOAL #3   Title Pt will be independent in a home exercise program for shoulder range of motion and strength     Time 4    Period Weeks    Status New    Target Date 09/01/21      PT LONG TERM GOAL #4   Title Pt will improve Quick DASH score to < 15% indicating and improvement in functional use of upper body    Baseline 38% 07/21/2021    Time 6    Period Weeks    Status New    Target Date 09/01/21      PT LONG TERM GOAL #5   Title pt will be fit for compression sleeve and glove for control of lymphedema    Time 6    Period Weeks    Status New    Target Date 09/01/21      Additional Long Term Goals   Additional Long Term Goals Yes      PT LONG TERM GOAL #6   Title Pt will be able to fasten seat belt and don coat without increased shoulder pain    Time 6    Period Weeks    Status New    Target Date 09/01/21                   Plan - 08/14/21 0955     Clinical Impression Statement Initiated self MLD to left UE with pt in sitting where she could watch therapist.  Pt performed all techniques after observing therapist.  She required frequent cueing initially to keep hand flat and for stretch, but with practice demonstrated excellent improvement.  She seemed to have a good understanding of the sequence also. She will require review    Personal Factors and Comorbidities Comorbidity 2    Comorbidities Left breast CA, Kidney disease, hypertension    Examination-Activity Limitations Reach Overhead;Carry;Sleep;Dressing;Lift    Stability/Clinical Decision Making Stable/Uncomplicated    Rehab Potential Good    Clinical Impairments Affecting Rehab Potential previous radiation     PT Frequency 2x / week    PT Duration 6 weeks    PT Treatment/Interventions ADLs/Self Care Home Management;Iontophoresis 4mg /ml Dexamethasone;Scar mobilization;Passive range of motion;Therapeutic activities;Therapeutic exercise;Orthotic  Fit/Training;Patient/family education;Manual techniques;Manual  lymph drainage;Compression bandaging;Taping    PT Next Visit Plan review MLD next in sitting,check left UE for swelling . continue with treating left shoulder pain with impingment, instruct in self MLD,    Consulted and Agree with Plan of Care Patient             Patient will benefit from skilled therapeutic intervention in order to improve the following deficits and impairments:  Decreased activity tolerance, Decreased strength, Increased edema, Postural dysfunction, Decreased knowledge of precautions, Pain, Impaired UE functional use, Decreased range of motion  Visit Diagnosis: Malignant neoplasm of upper-outer quadrant of left breast in female, estrogen receptor positive ([place])  Acute pain of left shoulder  Lymphedema, not elsewhere classified     Problem List Patient Active Problem List   Diagnosis Date Noted   Vitamin D deficiency 07/17/2021   Toxic multinodular goiter 07/17/2021   Personal history of colonic polyps 07/17/2021   Hypercholesterolemia 07/17/2021   History of hyperthyroidism 07/17/2021   Acquired absence of other specified parts of digestive tract 07/17/2021   Breast cancer of upper-outer quadrant of left female breast ([place]) 03/19/2016   Essential hypertension 10/09/2014   Chronic renal insufficiency 10/09/2014   Bochdale[place] hernia 10/02/2014    Claris Pong, PT 08/14/2021, 9:59 AM  [facility] @ [place], [place], [address] Phone: [phone]   Fax:  [phone]  Name: [name] MRN: [id number] Date of Birth: 1940-07-30

## 2021-08-19 ENCOUNTER — Other Ambulatory Visit: Payer: Self-pay

## 2021-08-19 ENCOUNTER — Ambulatory Visit: Payer: Medicare PPO

## 2021-08-19 DIAGNOSIS — C50412 Malignant neoplasm of upper-outer quadrant of left female breast: Secondary | ICD-10-CM

## 2021-08-19 DIAGNOSIS — Z170 Estrogen receptor positive status [ER+]: Secondary | ICD-10-CM

## 2021-08-19 DIAGNOSIS — M25512 Pain in left shoulder: Secondary | ICD-10-CM | POA: Diagnosis not present

## 2021-08-19 DIAGNOSIS — I890 Lymphedema, not elsewhere classified: Secondary | ICD-10-CM | POA: Diagnosis not present

## 2021-08-19 NOTE — Therapy (Signed)
[facility] @ [place], [place], [address] Phone: [phone]   Fax:  [phone]  Physical Therapy Treatment  Patient Details  Name: [name] MRN: [id number] Date of Birth: 1941-02-27 Referring Provider (PT): Wilber Bihari   Encounter Date: 08/19/2021   PT End of Session - 08/19/21 0916     Visit Number 8    Number of Visits 12    Date for PT Re-Evaluation 09/01/21    Authorization Type [employer]    Authorization - Visit Number 8    Authorization - Number of Visits 12    PT Start Time 0900    PT Stop Time 4008    PT Time Calculation (min) 47 min    Activity Tolerance Patient tolerated treatment well    Behavior During Therapy [facility] for tasks assessed/performed             Past Medical History:  Diagnosis Date   Allergy    Breast cancer ([place]) 01/02/2016   left breast   Chronic renal insufficiency 10/09/2014   Dehydration 10/09/2014   Essential hypertension 10/09/2014   Hypertension    Personal history of radiation therapy    2017 left breast   Pre-diabetes    Wears glasses     Past Surgical History:  Procedure Laterality Date   ABDOMINAL HYSTERECTOMY     BREAST LUMPECTOMY Left 2017   BREAST LUMPECTOMY WITH RADIOACTIVE SEED LOCALIZATION Left 02/27/2016   Procedure: LEFT BREAST LUMPECTOMY WITH RADIOACTIVE SEED LOCALIZATION;  Surgeon: Autumn Messing III, MD;  Location: [facility];  Service: General;  Laterality: Left;   BREAST LUMPECTOMY WITH SENTINEL LYMPH NODE BIOPSY Left 03/12/2016   Procedure: RE-EXCISION OF LEFT BREAST SUPERIOR  AND MEDIAL MARGIN  WITH SENTINEL LYMPH NODE BX;  Surgeon: Autumn Messing III, MD;  Location: [facility];  Service: General;  Laterality: Left;   CHOLECYSTECTOMY     COLON SURGERY     GASTROSTOMY N/A 10/04/2014   Procedure: GASTROSTOMY;  Surgeon: Autumn Messing III, MD;  [facility];  Service: General;  Laterality: N/A;   PARTIAL COLECTOMY     VENTRAL HERNIA REPAIR N/A  10/04/2014   Procedure: DIAPHRAGMATIC HERNIA REPAIR;  Surgeon: Autumn Messing III, MD;  [facility];  Service: General;  Laterality: N/A;    There were no vitals filed for this visit.   Subjective Assessment - 08/19/21 0900     Subjective I tried the MLD but not sure if I am doing it correctly. Shoulder is still doing well.    Pertinent History Pt had a left lumpectomy on 02/27/2016 and a re-excision performed on 03/12/2016 with 0/2 LN's .  She also had radiation and was on anastrazole for 5 years and has completed that                               [facility] Adult PT Treatment/Exercise - 08/19/21 0001       Shoulder Exercises: Standing   External Rotation Strengthening;Both;10 reps    Theraband Level (Shoulder External Rotation) Level 1 (Yellow)    Extension Strengthening;Both;12 reps    Theraband Level (Shoulder Extension) Level 2 (Red)    Retraction Strengthening;Both;12 reps    Theraband Level (Shoulder Retraction) Level 2 (Red)    Other Standing Exercises purple ball stabs x 20 up and down, side to side    Other Standing Exercises wall slide with lift off x 10  Manual Therapy   Manual Therapy Manual Lymphatic Drainage (MLD)    Manual Lymphatic Drainage (MLD) MLD instructed to pt. in sitting so she can see;short neck, right axillary and left inguinal LN's, anterior interaxillary pathway, left axillo-inguinal pathway, left lateral upper arm, medial to lateral, and lateral arm again retracing pathways, then both sides of forearm and retracing all steps and ending with LN's. Educated pt on technique of stretch versus slide, and gentle nature of technique.                          PT Long Term Goals - 08/19/21 0902       PT LONG TERM GOAL #1   Title Pt will report overall decreased pain in left shoulder by 50%     Period Weeks    Status Achieved    Target Date 08/19/21      PT LONG TERM GOAL #2   Title Pt will be independent in self MLD to the  left UE for control of swelling when present    Time 6    Period Weeks    Status On-going    Target Date 09/01/21      PT LONG TERM GOAL #3   Title Pt will be independent in a home exercise program for shoulder range of motion and strength     Time 4    Period Weeks    Target Date 08/19/21      PT LONG TERM GOAL #4   Title Pt will improve Quick DASH score to < 15% indicating and improvement in functional use of upper body    Baseline 38% 07/21/2021    Time 6    Period Weeks    Status On-going      PT LONG TERM GOAL #5   Title pt will be fit for compression sleeve and glove for control of lymphedema    Baseline fit but not received yet.    Time 6    Period Weeks    Status On-going    Target Date 09/01/21      PT LONG TERM GOAL #6   Title Pt will be able to fasten seat belt and don coat without increased shoulder pain    Time 6    Period Weeks    Status Achieved    Target Date 08/19/21                   Plan - 08/19/21 6440     Clinical Impression Statement Pt is making good progress towards LTG's. She has achieved her shoulder goals for pain, and function. Quick dash not completed today. Reviewed self MLD to the left UE in sitting.  Pt.needs occasional VC's for sequence and technique but is making good improvements. Reviewed shoulder strength exercises with occasional VC's for form    Personal Factors and Comorbidities Comorbidity 2    Comorbidities Left breast CA, Kidney disease, hypertension    Examination-Activity Limitations Reach Overhead;Carry;Sleep;Dressing;Lift    Stability/Clinical Decision Making Stable/Uncomplicated    Rehab Potential Good    Clinical Impairments Affecting Rehab Potential previous radiation     PT Frequency 2x / week    PT Duration 6 weeks    PT Treatment/Interventions ADLs/Self Care Home Management;Iontophoresis 4mg /ml Dexamethasone;Scar mobilization;Passive range of motion;Therapeutic activities;Therapeutic exercise;Orthotic  Fit/Training;Patient/family education;Manual techniques;Manual lymph drainage;Compression bandaging;Taping    PT Next Visit Plan review MLD next in sitting,check left UE for swelling . continue with treating left  shoulder pain with impingment, instruct in self MLD,    Recommended Other Services garments ordered    Consulted and Agree with Plan of Care Patient             Patient will benefit from skilled therapeutic intervention in order to improve the following deficits and impairments:  Decreased activity tolerance, Decreased strength, Increased edema, Postural dysfunction, Decreased knowledge of precautions, Pain, Impaired UE functional use, Decreased range of motion  Visit Diagnosis: Malignant neoplasm of upper-outer quadrant of left breast in female, estrogen receptor positive ([place])  Acute pain of left shoulder  Lymphedema, not elsewhere classified     Problem List Patient Active Problem List   Diagnosis Date Noted   Vitamin D deficiency 07/17/2021   Toxic multinodular goiter 07/17/2021   Personal history of colonic polyps 07/17/2021   Hypercholesterolemia 07/17/2021   History of hyperthyroidism 07/17/2021   Acquired absence of other specified parts of digestive tract 07/17/2021   Breast cancer of upper-outer quadrant of left female breast ([facility]) 03/19/2016   Essential hypertension 10/09/2014   Chronic renal insufficiency 10/09/2014   Bochdalek hernia 10/02/2014    Claris Pong, PT 08/19/2021, 9:49 AM  [facility] @ [place], [place], [address] Phone: [phone]   Fax:  [phone]  Name: Elaiza Shoberg MRN: [id number] Date of Birth: 02/26/1941

## 2021-08-21 ENCOUNTER — Other Ambulatory Visit: Payer: Self-pay

## 2021-08-21 ENCOUNTER — Ambulatory Visit: Payer: Medicare PPO

## 2021-08-21 DIAGNOSIS — Z170 Estrogen receptor positive status [ER+]: Secondary | ICD-10-CM

## 2021-08-21 DIAGNOSIS — I890 Lymphedema, not elsewhere classified: Secondary | ICD-10-CM | POA: Diagnosis not present

## 2021-08-21 DIAGNOSIS — M25512 Pain in left shoulder: Secondary | ICD-10-CM | POA: Diagnosis not present

## 2021-08-21 DIAGNOSIS — C50412 Malignant neoplasm of upper-outer quadrant of left female breast: Secondary | ICD-10-CM

## 2021-08-21 NOTE — Therapy (Signed)
[facility] @ [place], [place], [address] Phone: [phone]   Fax:  [phone]  Physical Therapy Treatment  Patient Details  Name: [name] MRN: [id number] Date of Birth: 1940-07-05 Referring Provider (PT): Wilber Bihari   Encounter Date: 08/21/2021   PT End of Session - 08/21/21 0902     Visit Number 9    Number of Visits 12    Date for PT Re-Evaluation 09/01/21    Authorization Type [employer]    Authorization - Visit Number 9    Authorization - Number of Visits 12    PT Start Time 4818    PT Stop Time 0949    PT Time Calculation (min) 45 min    Activity Tolerance Patient tolerated treatment well    Behavior During Therapy [facility] for tasks assessed/performed             Past Medical History:  Diagnosis Date   Allergy    Breast cancer ([place]) 01/02/2016   left breast   Chronic renal insufficiency 10/09/2014   Dehydration 10/09/2014   Essential hypertension 10/09/2014   Hypertension    Personal history of radiation therapy    2017 left breast   Pre-diabetes    Wears glasses     Past Surgical History:  Procedure Laterality Date   ABDOMINAL HYSTERECTOMY     BREAST LUMPECTOMY Left 2017   BREAST LUMPECTOMY WITH RADIOACTIVE SEED LOCALIZATION Left 02/27/2016   Procedure: LEFT BREAST LUMPECTOMY WITH RADIOACTIVE SEED LOCALIZATION;  Surgeon: Autumn Messing III, MD;  Location: [facility];  Service: General;  Laterality: Left;   BREAST LUMPECTOMY WITH SENTINEL LYMPH NODE BIOPSY Left 03/12/2016   Procedure: RE-EXCISION OF LEFT BREAST SUPERIOR  AND MEDIAL MARGIN  WITH SENTINEL LYMPH NODE BX;  Surgeon: Autumn Messing III, MD;  Location: [facility];  Service: General;  Laterality: Left;   CHOLECYSTECTOMY     COLON SURGERY     GASTROSTOMY N/A 10/04/2014   Procedure: GASTROSTOMY;  Surgeon: Autumn Messing III, MD;  [facility];  Service: General;  Laterality: N/A;   PARTIAL COLECTOMY     VENTRAL HERNIA REPAIR N/A  10/04/2014   Procedure: DIAPHRAGMATIC HERNIA REPAIR;  Surgeon: Autumn Messing III, MD;  [facility];  Service: General;  Laterality: N/A;    There were no vitals filed for this visit.   Subjective Assessment - 08/21/21 0903     Subjective I haven't heard about my garments yet.  I practiced the MLD last night and I think I did well. Shoulder is doing well but still gets sore if I sleep on it.    Pertinent History Pt had a left lumpectomy on 02/27/2016 and a re-excision performed on 03/12/2016 with 0/2 LN's .  She also had radiation and was on anastrazole for 5 years and has completed that    Patient Stated Goals to reduce swelling in the left arm, decrease left shoulder pain    Currently in Pain? No/denies    Pain Score 0-No pain                               [facility] Adult PT Treatment/Exercise - 08/21/21 0001       Exercises   Other Exercises  bal stabs on wall 2 x30 sec with PT tapping      Shoulder Exercises: Standing   External Rotation Strengthening;Both;15 reps    Theraband Level (Shoulder  External Rotation) Level 1 (Yellow)    Shoulder Elevation Strengthening;Right;Left;AROM;10 reps   flexion and scaption   Other Standing Exercises purple ball stabs x 20 up and down, side to side    Other Standing Exercises wall slide with lift off x 10      Manual Therapy   Manual Therapy Manual Lymphatic Drainage (MLD)    Manual Lymphatic Drainage (MLD) MLD reviewed with pt. in sitting so she can see;short neck, right axillary and left inguinal LN's, anterior interaxillary pathway, left axillo-inguinal pathway, left lateral upper arm, medial to lateral, and lateral arm again retracing pathways, then both sides of forearm and retracing all steps and ending with LN's.                          PT Long Term Goals - 08/19/21 0902       PT LONG TERM GOAL #1   Title Pt will report overall decreased pain in left shoulder by 50%     Period Weeks    Status Achieved     Target Date 08/19/21      PT LONG TERM GOAL #2   Title Pt will be independent in self MLD to the left UE for control of swelling when present    Time 6    Period Weeks    Status On-going    Target Date 09/01/21      PT LONG TERM GOAL #3   Title Pt will be independent in a home exercise program for shoulder range of motion and strength     Time 4    Period Weeks    Target Date 08/19/21      PT LONG TERM GOAL #4   Title Pt will improve Quick DASH score to < 15% indicating and improvement in functional use of upper body    Baseline 38% 07/21/2021    Time 6    Period Weeks    Status On-going      PT LONG TERM GOAL #5   Title pt will be fit for compression sleeve and glove for control of lymphedema    Baseline fit but not received yet.    Time 6    Period Weeks    Status On-going    Target Date 09/01/21      PT LONG TERM GOAL #6   Title Pt will be able to fasten seat belt and don coat without increased shoulder pain    Time 6    Period Weeks    Status Achieved    Target Date 08/19/21                   Plan - 08/21/21 0919     Clinical Impression Statement REviewed self UE MLD sequence with pt. Pt is using excellent technique and has a better understanding of sequence. SHe is only requiring occasional VC's now but is able to do independently when using her handout. Shoulder pain continues to be improved. She fatigues with stabilization exs. will decrease to 1x/week    Personal Factors and Comorbidities Comorbidity 2    Comorbidities Left breast CA, Kidney disease, hypertension    Examination-Activity Limitations Reach Overhead;Carry;Sleep;Dressing;Lift    Stability/Clinical Decision Making Stable/Uncomplicated    Rehab Potential Good    Clinical Impairments Affecting Rehab Potential previous radiation     PT Frequency 2x / week    PT Duration 6 weeks    PT Treatment/Interventions ADLs/Self Care Home Management;Iontophoresis  4mg /ml Dexamethasone;Scar  mobilization;Passive range of motion;Therapeutic activities;Therapeutic exercise;Orthotic Fit/Training;Patient/family education;Manual techniques;Manual lymph drainage;Compression bandaging;Taping    PT Next Visit Plan review MLD next in sitting,check left UE for swelling . continue with treating left shoulder pain with impingment, instruct in self MLD,    Consulted and Agree with Plan of Care Patient             Patient will benefit from skilled therapeutic intervention in order to improve the following deficits and impairments:  Decreased activity tolerance, Decreased strength, Increased edema, Postural dysfunction, Decreased knowledge of precautions, Pain, Impaired UE functional use, Decreased range of motion  Visit Diagnosis: Malignant neoplasm of upper-outer quadrant of left breast in female, estrogen receptor positive ([place])  Acute pain of left shoulder  Lymphedema, not elsewhere classified     Problem List Patient Active Problem List   Diagnosis Date Noted   Vitamin D deficiency 07/17/2021   Toxic multinodular goiter 07/17/2021   Personal history of colonic polyps 07/17/2021   Hypercholesterolemia 07/17/2021   History of hyperthyroidism 07/17/2021   Acquired absence of other specified parts of digestive tract 07/17/2021   Breast cancer of upper-outer quadrant of left female breast ([place]) 03/19/2016   Essential hypertension 10/09/2014   Chronic renal insufficiency 10/09/2014   Bochdalek hernia 10/02/2014    Claris Pong, PT 08/21/2021, 9:58 AM  [facility] @ [place], [place], [address] Phone: [phone]   Fax:  [phone]  Name: [name] MRN: [id number] Date of Birth: 1940-12-03

## 2021-08-28 ENCOUNTER — Other Ambulatory Visit: Payer: Self-pay

## 2021-08-28 ENCOUNTER — Ambulatory Visit: Payer: Medicare PPO | Attending: Adult Health

## 2021-08-28 DIAGNOSIS — M25512 Pain in left shoulder: Secondary | ICD-10-CM | POA: Insufficient documentation

## 2021-08-28 DIAGNOSIS — Z170 Estrogen receptor positive status [ER+]: Secondary | ICD-10-CM

## 2021-08-28 DIAGNOSIS — I890 Lymphedema, not elsewhere classified: Secondary | ICD-10-CM | POA: Insufficient documentation

## 2021-08-28 DIAGNOSIS — C50412 Malignant neoplasm of upper-outer quadrant of left female breast: Secondary | ICD-10-CM | POA: Insufficient documentation

## 2021-08-28 NOTE — Therapy (Signed)
[facility] @ [place], [place], [address] Phone: [phone]   Fax:  [phone]  Physical Therapy Treatment  Patient Details  Name: [name] MRN: [id number] Date of Birth: 02-11-1941 Referring Provider (PT): Wilber Bihari   Encounter Date: 08/28/2021   PT End of Session - 08/28/21 0856     Visit Number 10    Number of Visits 12    Date for PT Re-Evaluation 09/01/21    Authorization Type [employer]    Authorization - Visit Number 10    Authorization - Number of Visits 12    PT Start Time 3762    PT Stop Time 8315    PT Time Calculation (min) 53 min    Activity Tolerance Patient tolerated treatment well    Behavior During Therapy [facility] for tasks assessed/performed             Past Medical History:  Diagnosis Date   Allergy    Breast cancer ([place]) 01/02/2016   left breast   Chronic renal insufficiency 10/09/2014   Dehydration 10/09/2014   Essential hypertension 10/09/2014   Hypertension    Personal history of radiation therapy    2017 left breast   Pre-diabetes    Wears glasses     Past Surgical History:  Procedure Laterality Date   ABDOMINAL HYSTERECTOMY     BREAST LUMPECTOMY Left 2017   BREAST LUMPECTOMY WITH RADIOACTIVE SEED LOCALIZATION Left 02/27/2016   Procedure: LEFT BREAST LUMPECTOMY WITH RADIOACTIVE SEED LOCALIZATION;  Surgeon: Autumn Messing III, MD;  Location: [facility];  Service: General;  Laterality: Left;   BREAST LUMPECTOMY WITH SENTINEL LYMPH NODE BIOPSY Left 03/12/2016   Procedure: RE-EXCISION OF LEFT BREAST SUPERIOR  AND MEDIAL MARGIN  WITH SENTINEL LYMPH NODE BX;  Surgeon: Autumn Messing III, MD;  Location: [facility];  Service: General;  Laterality: Left;   CHOLECYSTECTOMY     COLON SURGERY     GASTROSTOMY N/A 10/04/2014   Procedure: GASTROSTOMY;  Surgeon: Autumn Messing III, MD;  [facility];  Service: General;  Laterality: N/A;   PARTIAL COLECTOMY     VENTRAL HERNIA REPAIR N/A  10/04/2014   Procedure: DIAPHRAGMATIC HERNIA REPAIR;  Surgeon: Autumn Messing III, MD;  [facility];  Service: General;  Laterality: N/A;    There were no vitals filed for this visit.   Subjective Assessment - 08/28/21 0856     Subjective Got sleeve and glove and night and circaid profile . I haven't tried them on yet.   Pertinent History Pt had a left lumpectomy on 02/27/2016 and a re-excision performed on 03/12/2016 with 0/2 LN's .  She also had radiation and was on anastrazole for 5 years and has completed that    Currently in Pain? No/denies    Pain Score 0-No pain                   LYMPHEDEMA/ONCOLOGY QUESTIONNAIRE - 08/28/21 0001       Right Upper Extremity Lymphedema   10 cm Proximal to Olecranon Process 38 cm    Olecranon Process 29.2 cm    10 cm Proximal to Ulnar Styloid Process 24.8 cm    Just Proximal to Ulnar Styloid Process 18.9 cm    Across Hand at [employer] 20.6 cm    At [place] of 2nd Digit 6.5 cm      Left Upper Extremity Lymphedema   10 cm Proximal to Olecranon Process 36 cm  Olecranon Process 29 cm    10 cm Proximal to Ulnar Styloid Process 25.5 cm    Just Proximal to Ulnar Styloid Process 18 cm    Across Hand at [employer] 20 cm    At [place] of 2nd Digit 6.5 cm                Quick Dash - 08/28/21 0001     Open a tight or new jar No difficulty    Do heavy household chores (wash walls, wash floors) No difficulty    Carry a shopping bag or briefcase Mild difficulty    Wash your back No difficulty    Use a knife to cut food No difficulty    Recreational activities in which you take some force or impact through your arm, shoulder, or hand (golf, hammering, tennis) No difficulty    During the past week, to what extent has your arm, shoulder or hand problem interfered with your normal social activities with family, friends, neighbors, or groups? Not at all    During the past week, to what extent has your arm, shoulder or hand problem  limited your work or other regular daily activities Not at all    Arm, shoulder, or hand pain. None    Tingling (pins and needles) in your arm, shoulder, or hand None    Difficulty Sleeping Mild difficulty    DASH Score 4.55 %                    [facility] Adult PT Treatment/Exercise - 08/28/21 0001       Manual Therapy   Manual therapy comments pt received new garments. Black exostrong sleeve and glove size Medium regular and glove, and circaid night.  Pts arm measures 50 cm and both sleeve and circaid are too short. will email Sharrie Rothman to order long size. Showed pts things that will help don sleeve including Slip EZ and butler, Will try with her when she gets new garments. Glove seems to fit well                         PT Long Term Goals - 08/28/21 0937       PT LONG TERM GOAL #1   Title Pt will report overall decreased pain in left shoulder by 50%     Time 6    Period Weeks    Status Achieved    Target Date 08/19/21      PT LONG TERM GOAL #2   Title Pt will be independent in self MLD to the left UE for control of swelling when present    Time 6    Period Weeks    Status Achieved      PT LONG TERM GOAL #3   Title Pt will be independent in a home exercise program for shoulder range of motion and strength     Time 4    Period Weeks    Status Achieved    Target Date 08/28/21      PT LONG TERM GOAL #4   Title Pt will improve Quick DASH score to < 15% indicating and improvement in functional use of upper body    Baseline 4.55 today    Time 6    Period Weeks    Status Achieved    Target Date 08/28/21      PT LONG TERM GOAL #5   Title pt will be fit for compression  sleeve and glove for control of lymphedema    Baseline received but don't fit properly and sent back    Time 6    Period Weeks    Status On-going                   Plan - 08/28/21 0955     Clinical Impression Statement Pt brought in her new exostrong sleeve/glove and circaid  profile.  We donned all ,and the sleeve and profile where too short.  Her arm measures 50 cm and she should be in a long.  She was shown how to don using blue gloves and arm butler and also shown Slip EZ as a way to assist with donning. We reassessed arm circumference and checked goals.  She has achieved all goals except she has not got proper fitting garments.  We will schedule an appt or 2 in several weeks to recheck new garments and proper way to don.    Personal Factors and Comorbidities Comorbidity 2    Comorbidities Left breast CA, Kidney disease, hypertension    Examination-Activity Limitations Reach Overhead;Carry;Sleep;Dressing;Lift    Rehab Potential Good    Clinical Impairments Affecting Rehab Potential previous radiation     PT Frequency 2x / week    PT Duration 6 weeks    PT Treatment/Interventions ADLs/Self Care Home Management;Iontophoresis 4mg /ml Dexamethasone;Scar mobilization;Passive range of motion;Therapeutic activities;Therapeutic exercise;Orthotic Fit/Training;Patient/family education;Manual techniques;Manual lymph drainage;Compression bandaging;Taping    PT Next Visit Plan Recert prn,assess new garments, show donning aids and practice, review exs/MLD prn    PT Home Exercise Plan shoulder strength/MLD    Recommended Other Services awaiting long version of garments ;emailed Sharrie Rothman today    Consulted and Agree with Plan of Care Patient             Patient will benefit from skilled therapeutic intervention in order to improve the following deficits and impairments:  Decreased activity tolerance, Decreased strength, Increased edema, Postural dysfunction, Decreased knowledge of precautions, Pain, Impaired UE functional use, Decreased range of motion  Visit Diagnosis: Malignant neoplasm of upper-outer quadrant of left breast in female, estrogen receptor positive ([place])  Acute pain of left shoulder  Lymphedema, not elsewhere classified     Problem List Patient Active  Problem List   Diagnosis Date Noted   Vitamin D deficiency 07/17/2021   Toxic multinodular goiter 07/17/2021   Personal history of colonic polyps 07/17/2021   Hypercholesterolemia 07/17/2021   History of hyperthyroidism 07/17/2021   Acquired absence of other specified parts of digestive tract 07/17/2021   Breast cancer of upper-outer quadrant of left female breast ([place]) 03/19/2016   Essential hypertension 10/09/2014   Chronic renal insufficiency 10/09/2014   Bochdalek hernia 10/02/2014    Claris Pong, PT 08/28/2021, 10:55 AM  [facility] @ [place], [place], [address] Phone: [phone]   Fax:  [phone]  Name: [name] MRN: [id number] Date of Birth: 1941/04/09

## 2021-09-11 ENCOUNTER — Other Ambulatory Visit: Payer: Self-pay

## 2021-09-11 ENCOUNTER — Ambulatory Visit: Payer: Medicare PPO

## 2021-09-11 DIAGNOSIS — I890 Lymphedema, not elsewhere classified: Secondary | ICD-10-CM | POA: Diagnosis not present

## 2021-09-11 DIAGNOSIS — M25512 Pain in left shoulder: Secondary | ICD-10-CM | POA: Diagnosis not present

## 2021-09-11 DIAGNOSIS — Z170 Estrogen receptor positive status [ER+]: Secondary | ICD-10-CM | POA: Diagnosis not present

## 2021-09-11 DIAGNOSIS — C50412 Malignant neoplasm of upper-outer quadrant of left female breast: Secondary | ICD-10-CM | POA: Diagnosis not present

## 2021-09-11 NOTE — Therapy (Signed)
[facility] ?[place], [place], [address] ?Phone: [phone]   Fax:  [phone] ? ?Physical Therapy Treatment ? ?Patient Details  ?Name: [name] ?MRN: [id number] ?Date of Birth: 1941-01-14 ?Referring Provider (PT): Wilber Bihari ? ? ?Encounter Date: 09/11/2021 ? ? PT End of Session - 09/11/21 1102   ? ? Visit Number 11   ? Number of Visits 12   ? Date for PT Re-Evaluation 09/11/21   ? Authorization Type [employer]   ? Authorization - Visit Number 11   ? PT Start Time 1103   ? PT Stop Time 1150   ? PT Time Calculation (min) 47 min   ? Activity Tolerance Patient tolerated treatment well   ? Behavior During Therapy [facility] for tasks assessed/performed   ? ?  ?  ? ?  ? ? ?Past Medical History:  ?Diagnosis Date  ? Allergy   ? Breast cancer ([place]) 01/02/2016  ? left breast  ? Chronic renal insufficiency 10/09/2014  ? Dehydration 10/09/2014  ? Essential hypertension 10/09/2014  ? Hypertension   ? Personal history of radiation therapy   ? 2017 left breast  ? Pre-diabetes   ? Wears glasses   ? ? ?Past Surgical History:  ?Procedure Laterality Date  ? ABDOMINAL HYSTERECTOMY    ? BREAST LUMPECTOMY Left 2017  ? BREAST LUMPECTOMY WITH RADIOACTIVE SEED LOCALIZATION Left 02/27/2016  ? Procedure: LEFT BREAST LUMPECTOMY WITH RADIOACTIVE SEED LOCALIZATION;  Surgeon: Autumn Messing III, MD;  Location: [facility];  Service: General;  Laterality: Left;  ? BREAST LUMPECTOMY WITH SENTINEL LYMPH NODE BIOPSY Left 03/12/2016  ? Procedure: RE-EXCISION OF LEFT BREAST SUPERIOR  AND MEDIAL MARGIN  WITH SENTINEL LYMPH NODE BX;  Surgeon: Autumn Messing III, MD;  Location: [facility] [place];  Service: General;  Laterality: Left;  ? CHOLECYSTECTOMY    ? COLON SURGERY    ? GASTROSTOMY N/A 10/04/2014  ? Procedure: GASTROSTOMY;  Surgeon: Autumn Messing III, MD;  Location: [facility];  Service: General;  Laterality: N/A;  ? PARTIAL COLECTOMY    ? VENTRAL HERNIA REPAIR N/A 10/04/2014  ? Procedure: DIAPHRAGMATIC  HERNIA REPAIR;  Surgeon: Autumn Messing III, MD;  Location: [facility];  Service: General;  Laterality: N/A;  ? ? ?There were no vitals filed for this visit. ? ? Subjective Assessment - 09/11/21 1102   ? ? Subjective Got sleeve and glove and night and circaid profile. My shoulder is doing well still.   ? Pertinent History Pt had a left lumpectomy on 02/27/2016 and a re-excision performed on 03/12/2016 with 0/2 LN's .  She also had radiation and was on anastrazole for 5 years and has completed that   ? Patient Stated Goals to reduce swelling in the left arm, decrease left shoulder pain   ? Currently in Pain? No/denies   ? Pain Score 0-No pain   ? ?  ?  ? ?  ? ? ? ? ? ? ? ? ? ? ? ? ? ? ? ? ? ? ? ? ? ? ? ? ? ? ? ? ? ? ? ? ? ? PT Long Term Goals - 09/11/21 1144   ? ?  ? PT LONG TERM GOAL #1  ? Title Pt will report overall decreased pain in left shoulder by 50%    ? Time 6   ? Period Weeks   ? Status Achieved   ? Target Date 08/19/21   ?  ? PT LONG TERM  GOAL #2  ? Title Pt will be independent in self MLD to the left UE for control of swelling when present   ? Time 6   ? Period Weeks   ? Status Achieved   ? Target Date 09/01/21   ?  ? PT LONG TERM GOAL #3  ? Title Pt will be independent in a home exercise program for shoulder range of motion and strength    ? Time 4   ? Period Weeks   ? Status Achieved   ? Target Date 08/28/21   ?  ? PT LONG TERM GOAL #4  ? Baseline 4.55 today   ? Time 6   ? Period Weeks   ? Status Achieved   ? Target Date 08/28/21   ?  ? PT LONG TERM GOAL #5  ? Title pt will be fit for compression sleeve and glove for control of lymphedema   ? Time 6   ? Period Weeks   ? Status Achieved   ? Target Date 09/11/21   ? ?  ?  ? ?  ? ? ? ? ? ? ? ? Plan - 09/11/21 1155   ? ? Clinical Impression Statement Pt returns with her new circaid long and compression sleeve (long) to have garments checked and to show them how to don.  Pt was able to don her circaid independently and was educated on how far it needed to be pulled up.   Her husband joined [place] as she practiced donning her compression sleeve using the Easy Slide.  She was able to get the sleeve up quite well, but had some difficulty getting it quite high enough. She was shown how to use the rubber glove to work the material up and husband was shown how to tuck soft tissue into the top of the sleeve. She has been reminded that compression sleeve is for day use only, and that she needs towear her glove with it. Advised to get used to it several hours at a time.They have mailing labels to ship back the other garments.  She has now achieved all goals established and will be discharge to independent self management.   ? Personal Factors and Comorbidities Comorbidity 2   ? Comorbidities Left breast CA, Kidney disease, hypertension   ? Examination-Activity Limitations Reach Overhead;Carry;Sleep;Dressing;Lift   ? Stability/Clinical Decision Making Stable/Uncomplicated   ? Rehab Potential Good   ? Clinical Impairments Affecting Rehab Potential previous radiation    ? PT Frequency --   no further visits required  ? PT Treatment/Interventions ADLs/Self Care Home Management;Iontophoresis 72m/ml Dexamethasone;Scar mobilization;Passive range of motion;Therapeutic activities;Therapeutic exercise;Orthotic Fit/Training;Patient/family education;Manual techniques;Manual lymph drainage;Compression bandaging;Taping   ? PT Next Visit Plan pt discharged to independent self management. She has achieved all goals   ? Consulted and Agree with Plan of Care Patient   ? ?  ?  ? ?  ? ? ?Patient will benefit from skilled therapeutic intervention in order to improve the following deficits and impairments:  Decreased activity tolerance, Decreased strength, Increased edema, Postural dysfunction, Decreased knowledge of precautions, Pain, Impaired UE functional use, Decreased range of motion ? ?Visit Diagnosis: ?Malignant neoplasm of upper-outer quadrant of left breast in female, estrogen receptor positive  ([place] ? ?Acute pain of left shoulder ? ?Lymphedema, not elsewhere classified ? ? ? ? ?Problem List ?Patient Active Problem List  ? Diagnosis Date Noted  ? Vitamin D deficiency 07/17/2021  ? Toxic multinodular goiter 07/17/2021  ? Personal history of colonic polyps 07/17/2021  ?  Hypercholesterolemia 07/17/2021  ? History of hyperthyroidism 07/17/2021  ? Acquired absence of other specified parts of digestive tract 07/17/2021  ? Breast cancer of upper-outer quadrant of left female breast ([place]) 03/19/2016  ? Essential hypertension 10/09/2014  ? Chronic renal insufficiency 10/09/2014  ? [place] hernia 10/02/2014  ?PHYSICAL THERAPY DISCHARGE SUMMARY ? ?Visits from Start of Care: 11 ? ?Current functional level related to goals / functional outcomes: ?Achieved all goals ?  ?Remaining deficits: ?none ?  ?Education / Equipment: ?Compression sleeve and glove, Circaid profile  ? ?Patient agrees to discharge. Patient goals were met. Patient is being discharged due to meeting the stated rehab goals. ? ? ?Claris Pong, PT ?09/11/2021, 12:01 PM ? ?[facility] [place] ?[place], [place], [address] ?Phone: [phone]   Fax:  [phone] ? ?Name: [name] ?MRN: [id number] ?Date of Birth: [birth year] ? ? ? ?

## 2021-10-30 DIAGNOSIS — H25811 Combined forms of age-related cataract, right eye: Secondary | ICD-10-CM | POA: Diagnosis not present

## 2021-10-31 ENCOUNTER — Telehealth: Payer: Self-pay | Admitting: Hematology and Oncology

## 2021-10-31 DIAGNOSIS — H2512 Age-related nuclear cataract, left eye: Secondary | ICD-10-CM | POA: Diagnosis not present

## 2021-10-31 DIAGNOSIS — H2511 Age-related nuclear cataract, right eye: Secondary | ICD-10-CM | POA: Diagnosis not present

## 2021-10-31 NOTE — Telephone Encounter (Signed)
Rescheduled appointment per providers. Patient aware.  ? ?

## 2021-11-18 NOTE — Progress Notes (Signed)
+  Patient Care Team: Kathyrn Lass, MD as PCP - General (Family Medicine) Nicholas Lose, MD as Consulting Physician (Hematology and Oncology) Delice Bison, Charlestine Massed, NP as Nurse Practitioner (Hematology and Oncology) Kyung Rudd, MD as Consulting Physician (Radiation Oncology) [name], MD as Consulting Physician (General Surgery) Rosita Fire, MD as Consulting Physician (Nephrology)  DIAGNOSIS:  Encounter Diagnosis  Name Primary?   Malignant neoplasm of upper-outer quadrant of left breast in female, estrogen receptor positive ([place]) Yes    SUMMARY OF ONCOLOGIC HISTORY: Oncology History  Breast cancer of upper-outer quadrant of left female breast ([place])  01/02/2016 Initial Biopsy   Left breast biopsy 1:00: Ductal papilloma with [facility]    02/27/2016 Surgery   Left lumpectomy: IDC with papillary features, grade 2, 1.1 cm, ADH, superior/medial margin positive, ER 95%, PR 95%, Ki-67 5%, HER-2 negative ratio 1.13, T1c N0 stage IA    03/12/2016 Surgery   Left superior and medial margin reexcision: Negative for malignancy, 0/2 lymph nodes; Oncotype Dx 0 (2% ROR)     04/21/2016 - 05/18/2016 Radiation Therapy   Adj XRT Lisbeth Renshaw): The patient initially received a dose of 42.5 Gy in 17 fractions to the breast using whole-breast tangent fields. This was delivered using a 3-D conformal technique. The patient then received a boost to the seroma. This delivered an additional 7.5 Gy in 3 fractions using a 3 field photon technique due to the depth of the seroma. The total dose was 50 Gy.    06/29/2016 - 06/28/2021 Anti-estrogen oral therapy   Anastrozole 1 mg daily      CHIEF COMPLIANT: Follow-up of left breast cancer on surveillance  INTERVAL HISTORY: [name] is a 81 y.o. with above-mentioned history of left breast cancer. She presents to the clinic today for follow-up. States the swelling in her arm is better.   ALLERGIES:  is allergic to amlodipine besy-benazepril hcl,  atorvastatin, hydrocodone-acetaminophen, and lisinopril.  MEDICATIONS:  Current Outpatient Medications  Medication Sig Dispense Refill   acetaminophen (TYLENOL) 500 MG tablet Take 500 mg by mouth daily as needed for headache.     amLODipine (NORVASC) 10 MG tablet Take 1 tablet (10 mg total) by mouth daily.     carvedilol (COREG) 3.125 MG tablet Take 3.125 mg by mouth 2 (two) times daily with a meal.     Cholecalciferol (VITAMIN D3) 2000 units capsule Take 2,000 Units by mouth daily.      losartan (COZAAR) 50 MG tablet Take 100 mg by mouth daily.     Multiple Vitamin (MULTIVITAMIN WITH MINERALS) TABS tablet Take 1 tablet by mouth daily.     No current facility-administered medications for this visit.    PHYSICAL EXAMINATION: ECOG PERFORMANCE STATUS: 1 - Symptomatic but completely ambulatory  Vitals:   11/25/21 0941  BP: (!) 158/84  Pulse: 68  Resp: 18  Temp: 98.2 F (36.8 C)  SpO2: 99%   Filed Weights   11/25/21 0941  Weight: 192 lb 1.6 oz (87.1 kg)    BREAST: No palpable masses or nodules in either right or left breasts. No palpable axillary supraclavicular or infraclavicular adenopathy no breast tenderness or nipple discharge. (exam performed in the presence of a chaperone)  LABORATORY DATA:  I have reviewed the data as listed    Latest Ref Rng & Units 02/20/2016    8:37 AM 10/06/2014    6:00 AM 10/03/2014    5:07 AM  CMP  Glucose 65 - 99 mg/dL 98   116   116  BUN 6 - 20 mg/dL '17   9   17    ' Creatinine 0.44 - 1.00 mg/dL 1.41   1.02   1.18    Sodium 135 - 145 mmol/L 139   140   148    Potassium 3.5 - 5.1 mmol/L 3.8   3.5   4.0    Chloride 101 - 111 mmol/L 105   113   113    CO2 22 - 32 mmol/L '27   23   29    ' Calcium 8.9 - 10.3 mg/dL 9.1   8.2   8.8      Lab Results  Component Value Date   WBC 6.4 02/20/2016   HGB 13.2 02/20/2016   HCT 41.1 02/20/2016   MCV 88.0 02/20/2016   PLT 356 02/20/2016   NEUTROABS 8.9 (H) 10/02/2014    ASSESSMENT & PLAN:  Breast  cancer of upper-outer quadrant of left female breast ([place]) Left lumpectomy 02/27/2016: IDC with papillary features, grade 2, 1.1 cm, ADH, superior/medial margin positive, ER 95%, PR 95%, Ki-67 5%, HER-2 negative ratio 1.13,  Margin re-excision 03/12/2016: Negative for cancer, 0/2 lymph nodes negative Pathologic staging: T1c N0 stage IA Oncotype Dx score 0: 2% ROR Adj XRT 04/21/16 to 05/15/16   Treatment Plan: Adjuvant therapy with Anastrozole 1 mg daily X 5 years started 06/29/2016 She will finish 5 years by end of December 2022   Anastrozole toxicities: 1. Hot flashes resolved since she started taking anastrozole at bedtime 2. muscle stiffness I encouraged her to exercise regularly.   Breast Cancer Surveillance: 1. Mammogram 01/22/2021: Benign calcifications suspicious for fat necrosis left breast lumpectomy site, breast density category B 2. Breast Exam: 11/25/2021: Benign   CT chest 08/01/2021: Small pulmonary nodules scattered bilaterally nonspecific, subtle sclerosis and lucency throughout the visualized axial and appendicular skeleton  Radiology review: Because she did not have any prior CT scans I would like to repeat another CT chest without contrast (contrast related nephropathy) to assess the lung nodules.  Telephone visit after the CT chest to discuss results. Follow-up in 1 year (patient would like to follow-up once a year with [place].)    Orders Placed This Encounter  Procedures   CT Chest Wo Contrast    Standing Status:   Future    Standing Expiration Date:   11/25/2022    Order Specific Question:   Preferred imaging location?    Answer:   [facility]   The patient has a good understanding of the overall plan. she agrees with it. she will call with any problems that may develop before the next visit here. Total time spent: 30 mins including face to face time and time spent for planning, charting and co-ordination of care   Harriette Ohara, MD 11/25/21    I  Gardiner Coins am scribing for Dr. [name]  I have reviewed the above documentation for accuracy and completeness, and I agree with the above.

## 2021-11-19 ENCOUNTER — Ambulatory Visit: Payer: Medicare PPO | Admitting: Hematology and Oncology

## 2021-11-25 ENCOUNTER — Inpatient Hospital Stay: Payer: Medicare PPO | Attending: Hematology and Oncology | Admitting: Hematology and Oncology

## 2021-11-25 ENCOUNTER — Other Ambulatory Visit: Payer: Self-pay

## 2021-11-25 VITALS — BP 158/84 | HR 68 | Temp 98.2°F | Resp 18 | Ht 68.0 in | Wt 192.1 lb

## 2021-11-25 DIAGNOSIS — Z170 Estrogen receptor positive status [ER+]: Secondary | ICD-10-CM | POA: Diagnosis not present

## 2021-11-25 DIAGNOSIS — R918 Other nonspecific abnormal finding of lung field: Secondary | ICD-10-CM | POA: Insufficient documentation

## 2021-11-25 DIAGNOSIS — Z79899 Other long term (current) drug therapy: Secondary | ICD-10-CM | POA: Insufficient documentation

## 2021-11-25 DIAGNOSIS — C50412 Malignant neoplasm of upper-outer quadrant of left female breast: Secondary | ICD-10-CM | POA: Diagnosis not present

## 2021-11-25 DIAGNOSIS — Z853 Personal history of malignant neoplasm of breast: Secondary | ICD-10-CM | POA: Diagnosis not present

## 2021-11-25 NOTE — Assessment & Plan Note (Addendum)
Left lumpectomy08/31/2017:IDC with papillary features, grade 2, 1.1 cm, ADH, superior/medial margin positive, ER 95%, PR 95%, Ki-67 5%, HER-2 negative ratio 1.13,  Margin re-excision 03/12/2016: Negative for cancer, 0/2 lymph nodes negative Pathologic staging: T1c N0 stage IA Oncotype Dx score 0: 2% ROR Adj XRT 04/21/16 to 05/15/16  Treatment Plan: Adjuvant therapy with Anastrozole 1 mg daily X 5 years started 06/29/2016 She will finish 5 years by end of December 2022  Anastrozole toxicities: 1.Hot flashesresolved since she started taking anastrozole at bedtime 2.muscle stiffness I encouraged her to exercise regularly.  Breast Cancer Surveillance: 1. Mammogram 01/22/2021: Benign calcifications suspicious for fat necrosis left breast lumpectomy site, breast density category B 2. Breast Exam: 11/25/2021:Benign  CT chest 08/01/2021: Small pulmonary nodules scattered bilaterally nonspecific, subtle sclerosis and lucency throughout the visualized axial and appendicular skeleton  Radiology review: Because she did not have any prior CT scans I would like to repeat another CT chest without contrast (contrast related nephropathy) to assess the lung nodules.  Telephone visit after the CT chest to discuss results. Follow-up in 1 year

## 2021-11-26 ENCOUNTER — Telehealth: Payer: Self-pay | Admitting: Hematology and Oncology

## 2021-11-26 NOTE — Telephone Encounter (Signed)
Scheduled appointment per 5/30 los. Left message.

## 2021-12-01 DIAGNOSIS — H25812 Combined forms of age-related cataract, left eye: Secondary | ICD-10-CM | POA: Diagnosis not present

## 2021-12-01 DIAGNOSIS — H2512 Age-related nuclear cataract, left eye: Secondary | ICD-10-CM | POA: Diagnosis not present

## 2021-12-24 ENCOUNTER — Other Ambulatory Visit: Payer: Self-pay | Admitting: Family Medicine

## 2021-12-24 DIAGNOSIS — Z09 Encounter for follow-up examination after completed treatment for conditions other than malignant neoplasm: Secondary | ICD-10-CM

## 2021-12-31 DIAGNOSIS — N1832 Chronic kidney disease, stage 3b: Secondary | ICD-10-CM | POA: Diagnosis not present

## 2022-01-07 DIAGNOSIS — E559 Vitamin D deficiency, unspecified: Secondary | ICD-10-CM | POA: Diagnosis not present

## 2022-01-07 DIAGNOSIS — N1832 Chronic kidney disease, stage 3b: Secondary | ICD-10-CM | POA: Diagnosis not present

## 2022-01-07 DIAGNOSIS — R809 Proteinuria, unspecified: Secondary | ICD-10-CM | POA: Diagnosis not present

## 2022-01-07 DIAGNOSIS — D631 Anemia in chronic kidney disease: Secondary | ICD-10-CM | POA: Diagnosis not present

## 2022-01-07 DIAGNOSIS — I129 Hypertensive chronic kidney disease with stage 1 through stage 4 chronic kidney disease, or unspecified chronic kidney disease: Secondary | ICD-10-CM | POA: Diagnosis not present

## 2022-01-23 ENCOUNTER — Ambulatory Visit
Admission: RE | Admit: 2022-01-23 | Discharge: 2022-01-23 | Disposition: A | Discharge status: Home / Self Care | Payer: Medicare PPO | Source: Ambulatory Visit | Attending: Family Medicine | Admitting: Family Medicine

## 2022-01-23 DIAGNOSIS — Z09 Encounter for follow-up examination after completed treatment for conditions other than malignant neoplasm: Secondary | ICD-10-CM

## 2022-01-23 DIAGNOSIS — R921 Mammographic calcification found on diagnostic imaging of breast: Secondary | ICD-10-CM | POA: Diagnosis not present

## 2022-02-06 DIAGNOSIS — H59033 Cystoid macular edema following cataract surgery, bilateral: Secondary | ICD-10-CM | POA: Diagnosis not present

## 2022-02-25 NOTE — Progress Notes (Unsigned)
HEMATOLOGY-ONCOLOGY TELEPHONE VISIT PROGRESS NOTE  I connected with our patient on 03/02/22 at  8:45 AM EDT by telephone and verified that I am speaking with the correct person using two identifiers.  I discussed the limitations, risks, security and privacy concerns of performing an evaluation and management service by telephone and the availability of in person appointments.  I also discussed with the patient that there may be a patient responsible charge related to this service. The patient expressed understanding and agreed to proceed.   History of Present Illness: [name] is a 81 y.o. with above-mentioned history of left breast cancer. She presents to the clinic today for telephone follow-up.  Oncology History  Breast cancer of upper-outer quadrant of left female breast ([place])  01/02/2016 Initial Biopsy   Left breast biopsy 1:00: Ductal papilloma with [facility]   02/27/2016 Surgery   Left lumpectomy: IDC with papillary features, grade 2, 1.1 cm, ADH, superior/medial margin positive, ER 95%, PR 95%, Ki-67 5%, HER-2 negative ratio 1.13, T1c N0 stage IA   03/12/2016 Surgery   Left superior and medial margin reexcision: Negative for malignancy, 0/2 lymph nodes; Oncotype Dx 0 (2% ROR)    04/21/2016 - 05/18/2016 Radiation Therapy   Adj XRT Lisbeth Renshaw): The patient initially received a dose of 42.5 Gy in 17 fractions to the breast using whole-breast tangent fields. This was delivered using a 3-D conformal technique. The patient then received a boost to the seroma. This delivered an additional 7.5 Gy in 3 fractions using a 3 field photon technique due to the depth of the seroma. The total dose was 50 Gy.   06/29/2016 - 06/28/2021 Anti-estrogen oral therapy   Anastrozole 1 mg daily     REVIEW OF SYSTEMS:   Constitutional: Denies fevers, chills or abnormal weight loss All other systems were reviewed with the patient and are negative. Observations/Objective:     Assessment Plan:  Breast cancer of  upper-outer quadrant of left female breast ([place]) Left lumpectomy08/31/2017:IDC with papillary features, grade 2, 1.1 cm, ADH, superior/medial margin positive, ER 95%, PR 95%, Ki-67 5%, HER-2 negative ratio 1.13,  Margin re-excision 03/12/2016: Negative for cancer, 0/2 lymph nodes negative Pathologic staging: T1c N0 stage IA Oncotype Dx score 0: 2% ROR Adj XRT 04/21/16 to 05/15/16  Treatment Plan: Adjuvant therapy with Anastrozole 1 mg daily X 5 years started 06/29/2016 She will finish 5 years by end of December 2022  Anastrozole toxicities: 1.Hot flashesresolved since she started taking anastrozole at bedtime 2.muscle stiffness I encouraged her to exercise regularly.  Breast Cancer Surveillance: 1. Mammogram 01/22/2021: Benign calcifications suspicious for fat necrosis left breast lumpectomy site, breast density category B 2. Breast Exam: 11/25/2021:Benign  CT chest 08/01/2021: Small pulmonary nodules scattered bilaterally nonspecific, subtle sclerosis and lucency throughout the visualized axial and appendicular skeleton  She has not had CT scans that were ordered.  So we will get her an appointment for the CT scan and telephone visit after that to discuss results.    I discussed the assessment and treatment plan with the patient. The patient was provided an opportunity to ask questions and all were answered. The patient agreed with the plan and demonstrated an understanding of the instructions. The patient was advised to call back or seek an in-person evaluation if the symptoms worsen or if the condition fails to improve as anticipated.   I provided 12 minutes of non-face-to-face time during this encounter.  This includes time for charting and coordination of care   Harriette Ohara,  MD  I Gardiner Coins am scribing for Dr. [name]  I have reviewed the above documentation for accuracy and completeness, and I agree with the above.   This encounter was created in error -  please disregard.

## 2022-02-26 DIAGNOSIS — H59033 Cystoid macular edema following cataract surgery, bilateral: Secondary | ICD-10-CM | POA: Diagnosis not present

## 2022-02-26 DIAGNOSIS — H35033 Hypertensive retinopathy, bilateral: Secondary | ICD-10-CM | POA: Diagnosis not present

## 2022-02-27 ENCOUNTER — Inpatient Hospital Stay: Payer: Medicare PPO | Admitting: Hematology and Oncology

## 2022-02-27 NOTE — Assessment & Plan Note (Addendum)
Left lumpectomy08/31/2017:IDC with papillary features, grade 2, 1.1 cm, ADH, superior/medial margin positive, ER 95%, PR 95%, Ki-67 5%, HER-2 negative ratio 1.13,  Margin re-excision 03/12/2016: Negative for cancer, 0/2 lymph nodes negative Pathologic staging: T1c N0 stage IA Oncotype Dx score 0: 2% ROR Adj XRT 04/21/16 to 05/15/16  Treatment Plan: Adjuvant therapy with Anastrozole 1 mg daily X 5 years started 06/29/2016 She will finish 5 years by end of December 2022  Anastrozole toxicities: 1.Hot flashesresolved since she started taking anastrozole at bedtime 2.muscle stiffness I encouraged her to exercise regularly.  Breast Cancer Surveillance: 1. Mammogram 01/22/2021: Benign calcifications suspicious for fat necrosis left breast lumpectomy site, breast density category B 2. Breast Exam: 11/25/2021:Benign  CT chest 08/01/2021: Small pulmonary nodules scattered bilaterally nonspecific, subtle sclerosis and lucency throughout the visualized axial and appendicular skeleton  She has not had CT scans that were ordered.  So we will get her an appointment for the CT scan and telephone visit after that to discuss results.

## 2022-03-05 ENCOUNTER — Ambulatory Visit (HOSPITAL_COMMUNITY)
Admission: RE | Admit: 2022-03-05 | Discharge: 2022-03-05 | Disposition: A | Discharge status: Home / Self Care | Payer: Medicare PPO | Source: Ambulatory Visit | Attending: Hematology and Oncology | Admitting: Hematology and Oncology

## 2022-03-05 DIAGNOSIS — R918 Other nonspecific abnormal finding of lung field: Secondary | ICD-10-CM | POA: Diagnosis not present

## 2022-03-05 DIAGNOSIS — Z170 Estrogen receptor positive status [ER+]: Secondary | ICD-10-CM | POA: Diagnosis not present

## 2022-03-05 DIAGNOSIS — C50412 Malignant neoplasm of upper-outer quadrant of left female breast: Secondary | ICD-10-CM | POA: Diagnosis not present

## 2022-03-05 NOTE — Progress Notes (Signed)
HEMATOLOGY-ONCOLOGY TELEPHONE VISIT PROGRESS NOTE  I connected with our patient on 03/09/22 at 12:15 PM EDT by telephone and verified that I am speaking with the correct person using two identifiers.  I discussed the limitations, risks, security and privacy concerns of performing an evaluation and management service by telephone and the availability of in person appointments.  I also discussed with the patient that there may be a patient responsible charge related to this service. The patient expressed understanding and agreed to proceed.   History of Present Illness: [name] is a 81 y.o. with above-mentioned history of left breast cancer. She presents to the clinic today for telephone follow-up.  Oncology History  Breast cancer of upper-outer quadrant of left female breast ([place])  01/02/2016 Initial Biopsy   Left breast biopsy 1:00: Ductal papilloma with [facility]   02/27/2016 Surgery   Left lumpectomy: IDC with papillary features, grade 2, 1.1 cm, ADH, superior/medial margin positive, ER 95%, PR 95%, Ki-67 5%, HER-2 negative ratio 1.13, T1c N0 stage IA   03/12/2016 Surgery   Left superior and medial margin reexcision: Negative for malignancy, 0/2 lymph nodes; Oncotype Dx 0 (2% ROR)    04/21/2016 - 05/18/2016 Radiation Therapy   Adj XRT Lisbeth Renshaw): The patient initially received a dose of 42.5 Gy in 17 fractions to the breast using whole-breast tangent fields. This was delivered using a 3-D conformal technique. The patient then received a boost to the seroma. This delivered an additional 7.5 Gy in 3 fractions using a 3 field photon technique due to the depth of the seroma. The total dose was 50 Gy.   06/29/2016 - 06/28/2021 Anti-estrogen oral therapy   Anastrozole 1 mg daily     REVIEW OF SYSTEMS:   Constitutional: Denies fevers, chills or abnormal weight loss All other systems were reviewed with the patient and are negative. Observations/Objective:     Assessment Plan:  Breast cancer of  upper-outer quadrant of left female breast ([place]) Left lumpectomy 02/27/2016: IDC with papillary features, grade 2, 1.1 cm, ADH, superior/medial margin positive, ER 95%, PR 95%, Ki-67 5%, HER-2 negative ratio 1.13,  Margin re-excision 03/12/2016: Negative for cancer, 0/2 lymph nodes negative Pathologic staging: T1c N0 stage IA Oncotype Dx score 0: 2% ROR Adj XRT 04/21/16 to 05/15/16   Treatment Plan: Adjuvant therapy with Anastrozole 1 mg daily X 5 years started 06/29/2016 completed December 2022      Breast Cancer Surveillance:  Mammogram 01/23/2022: Benign calcifications suspicious for fat necrosis left breast lumpectomy site, breast density category B   CT chest 03/06/2022: Pulmonary nodules measuring 5 mm or less in size and stable compared to 07/30/2021.  Favor benign lesions recommend continued attention on follow-up.  Recheck scans in 1 year and telephone visit 2 days after that to discuss results   I discussed the assessment and treatment plan with the patient. The patient was provided an opportunity to ask questions and all were answered. The patient agreed with the plan and demonstrated an understanding of the instructions. The patient was advised to call back or seek an in-person evaluation if the symptoms worsen or if the condition fails to improve as anticipated.   I provided 12 minutes of non-face-to-face time during this encounter.  This includes time for charting and coordination of care   Harriette Ohara, MD   I Gardiner Coins am scribing for Dr. [name]  I have reviewed the above documentation for accuracy and completeness, and I agree with the above.

## 2022-03-09 ENCOUNTER — Inpatient Hospital Stay: Payer: Medicare PPO | Attending: Hematology and Oncology | Admitting: Hematology and Oncology

## 2022-03-09 DIAGNOSIS — C50412 Malignant neoplasm of upper-outer quadrant of left female breast: Secondary | ICD-10-CM | POA: Diagnosis not present

## 2022-03-09 DIAGNOSIS — Z170 Estrogen receptor positive status [ER+]: Secondary | ICD-10-CM

## 2022-03-09 NOTE — Assessment & Plan Note (Deleted)
Left lumpectomy08/31/2017:IDC with papillary features, grade 2, 1.1 cm, ADH, superior/medial margin positive, ER 95%, PR 95%, Ki-67 5%, HER-2 negative ratio 1.13,  Margin re-excision 03/12/2016: Negative for cancer, 0/2 lymph nodes negative Pathologic staging: T1c N0 stage IA Oncotype Dx score 0: 2% ROR Adj XRT 04/21/16 to 05/15/16  Treatment Plan: Adjuvant therapy with Anastrozole 1 mg daily X 5 years started 06/29/2016 She will finish 5 years by end of December 2022  Anastrozole toxicities: 1.Hot flashesresolved since she started taking anastrozole at bedtime 2.muscle stiffness I encouraged her to exercise regularly.  Breast Cancer Surveillance: 1. Mammogram7/28/2023: Benign calcificationssuspicious for fat necrosisleft breast lumpectomy site, breast density category B 2. Breast Exam: 03/09/2022:Benign

## 2022-03-19 DIAGNOSIS — H35033 Hypertensive retinopathy, bilateral: Secondary | ICD-10-CM | POA: Diagnosis not present

## 2022-03-19 DIAGNOSIS — H59033 Cystoid macular edema following cataract surgery, bilateral: Secondary | ICD-10-CM | POA: Diagnosis not present

## 2022-04-23 DIAGNOSIS — H35033 Hypertensive retinopathy, bilateral: Secondary | ICD-10-CM | POA: Diagnosis not present

## 2022-04-23 DIAGNOSIS — H59032 Cystoid macular edema following cataract surgery, left eye: Secondary | ICD-10-CM | POA: Diagnosis not present

## 2022-04-23 DIAGNOSIS — H59033 Cystoid macular edema following cataract surgery, bilateral: Secondary | ICD-10-CM | POA: Diagnosis not present

## 2022-07-02 DIAGNOSIS — N1832 Chronic kidney disease, stage 3b: Secondary | ICD-10-CM | POA: Diagnosis not present

## 2022-07-02 DIAGNOSIS — H43813 Vitreous degeneration, bilateral: Secondary | ICD-10-CM | POA: Diagnosis not present

## 2022-07-02 DIAGNOSIS — H35033 Hypertensive retinopathy, bilateral: Secondary | ICD-10-CM | POA: Diagnosis not present

## 2022-07-02 DIAGNOSIS — H59033 Cystoid macular edema following cataract surgery, bilateral: Secondary | ICD-10-CM | POA: Diagnosis not present

## 2022-07-09 DIAGNOSIS — I129 Hypertensive chronic kidney disease with stage 1 through stage 4 chronic kidney disease, or unspecified chronic kidney disease: Secondary | ICD-10-CM | POA: Diagnosis not present

## 2022-07-09 DIAGNOSIS — E559 Vitamin D deficiency, unspecified: Secondary | ICD-10-CM | POA: Diagnosis not present

## 2022-07-09 DIAGNOSIS — R809 Proteinuria, unspecified: Secondary | ICD-10-CM | POA: Diagnosis not present

## 2022-07-09 DIAGNOSIS — D631 Anemia in chronic kidney disease: Secondary | ICD-10-CM | POA: Diagnosis not present

## 2022-07-09 DIAGNOSIS — N1832 Chronic kidney disease, stage 3b: Secondary | ICD-10-CM | POA: Diagnosis not present

## 2022-08-03 DIAGNOSIS — I129 Hypertensive chronic kidney disease with stage 1 through stage 4 chronic kidney disease, or unspecified chronic kidney disease: Secondary | ICD-10-CM | POA: Diagnosis not present

## 2022-08-03 DIAGNOSIS — E7800 Pure hypercholesterolemia, unspecified: Secondary | ICD-10-CM | POA: Diagnosis not present

## 2022-08-03 DIAGNOSIS — E559 Vitamin D deficiency, unspecified: Secondary | ICD-10-CM | POA: Diagnosis not present

## 2022-08-03 DIAGNOSIS — N1832 Chronic kidney disease, stage 3b: Secondary | ICD-10-CM | POA: Diagnosis not present

## 2022-08-03 DIAGNOSIS — E669 Obesity, unspecified: Secondary | ICD-10-CM | POA: Diagnosis not present

## 2022-08-03 DIAGNOSIS — Z23 Encounter for immunization: Secondary | ICD-10-CM | POA: Diagnosis not present

## 2022-08-13 DIAGNOSIS — H35373 Puckering of macula, bilateral: Secondary | ICD-10-CM | POA: Diagnosis not present

## 2022-08-13 DIAGNOSIS — H43813 Vitreous degeneration, bilateral: Secondary | ICD-10-CM | POA: Diagnosis not present

## 2022-08-13 DIAGNOSIS — H59033 Cystoid macular edema following cataract surgery, bilateral: Secondary | ICD-10-CM | POA: Diagnosis not present

## 2022-08-13 DIAGNOSIS — H59031 Cystoid macular edema following cataract surgery, right eye: Secondary | ICD-10-CM | POA: Diagnosis not present

## 2022-08-13 DIAGNOSIS — H35033 Hypertensive retinopathy, bilateral: Secondary | ICD-10-CM | POA: Diagnosis not present

## 2022-08-24 DIAGNOSIS — Z6831 Body mass index (BMI) 31.0-31.9, adult: Secondary | ICD-10-CM | POA: Diagnosis not present

## 2022-08-24 DIAGNOSIS — E559 Vitamin D deficiency, unspecified: Secondary | ICD-10-CM | POA: Diagnosis not present

## 2022-08-24 DIAGNOSIS — E7800 Pure hypercholesterolemia, unspecified: Secondary | ICD-10-CM | POA: Diagnosis not present

## 2022-08-24 DIAGNOSIS — I129 Hypertensive chronic kidney disease with stage 1 through stage 4 chronic kidney disease, or unspecified chronic kidney disease: Secondary | ICD-10-CM | POA: Diagnosis not present

## 2022-10-19 DIAGNOSIS — H35033 Hypertensive retinopathy, bilateral: Secondary | ICD-10-CM | POA: Diagnosis not present

## 2022-10-19 DIAGNOSIS — H43813 Vitreous degeneration, bilateral: Secondary | ICD-10-CM | POA: Diagnosis not present

## 2022-10-19 DIAGNOSIS — H59033 Cystoid macular edema following cataract surgery, bilateral: Secondary | ICD-10-CM | POA: Diagnosis not present

## 2022-10-19 DIAGNOSIS — H35373 Puckering of macula, bilateral: Secondary | ICD-10-CM | POA: Diagnosis not present

## 2022-10-19 DIAGNOSIS — H59032 Cystoid macular edema following cataract surgery, left eye: Secondary | ICD-10-CM | POA: Diagnosis not present

## 2022-11-03 DIAGNOSIS — Z853 Personal history of malignant neoplasm of breast: Secondary | ICD-10-CM | POA: Diagnosis not present

## 2022-11-03 DIAGNOSIS — E559 Vitamin D deficiency, unspecified: Secondary | ICD-10-CM | POA: Diagnosis not present

## 2022-11-03 DIAGNOSIS — E7800 Pure hypercholesterolemia, unspecified: Secondary | ICD-10-CM | POA: Diagnosis not present

## 2022-11-03 DIAGNOSIS — I129 Hypertensive chronic kidney disease with stage 1 through stage 4 chronic kidney disease, or unspecified chronic kidney disease: Secondary | ICD-10-CM | POA: Diagnosis not present

## 2022-11-03 DIAGNOSIS — Z6830 Body mass index (BMI) 30.0-30.9, adult: Secondary | ICD-10-CM | POA: Diagnosis not present

## 2022-11-03 DIAGNOSIS — Z0000 Encounter for general adult medical examination without abnormal findings: Secondary | ICD-10-CM | POA: Diagnosis not present

## 2022-11-26 ENCOUNTER — Inpatient Hospital Stay: Payer: Medicare PPO | Attending: Hematology and Oncology | Admitting: Hematology and Oncology

## 2022-11-26 ENCOUNTER — Telehealth: Payer: Self-pay

## 2022-11-26 NOTE — Telephone Encounter (Signed)
Pt called stating she was unable to make today's morning MD visit and would like to reschedule. Scheduling message sent and advised Pt that it will most likely be several weeks before next MD appt is available. Pt verbalized understanding.

## 2022-11-26 NOTE — Assessment & Plan Note (Deleted)
Left lumpectomy 02/27/2016: IDC with papillary features, grade 2, 1.1 cm, ADH, superior/medial margin positive, ER 95%, PR 95%, Ki-67 5%, HER-2 negative ratio 1.13,  Margin re-excision 03/12/2016: Negative for cancer, 0/2 lymph nodes negative Pathologic staging: T1c N0 stage IA Oncotype Dx score 0: 2% ROR Adj XRT 04/21/16 to 05/15/16   Treatment Plan: Adjuvant therapy with Anastrozole 1 mg daily X 5 years started 06/29/2016 completed December 2022      Breast Cancer Surveillance:  Mammogram 01/23/2022: Benign calcifications suspicious for fat necrosis left breast lumpectomy site, breast density category B Breast exam 11/26/2022: Benign   CT chest 03/06/2022: Pulmonary nodules measuring 5 mm or less in size and stable compared to 07/30/2021.  Favor benign lesions recommend continued attention on follow-up.   Recheck scans September and telephone visit 2 days after that to discuss results

## 2022-12-01 ENCOUNTER — Telehealth: Payer: Self-pay | Admitting: Hematology and Oncology

## 2022-12-01 NOTE — Telephone Encounter (Signed)
Scheduled appointment per scheduling message. Patient is aware of the made appointments.  

## 2022-12-02 DIAGNOSIS — E7800 Pure hypercholesterolemia, unspecified: Secondary | ICD-10-CM | POA: Diagnosis not present

## 2022-12-02 DIAGNOSIS — I129 Hypertensive chronic kidney disease with stage 1 through stage 4 chronic kidney disease, or unspecified chronic kidney disease: Secondary | ICD-10-CM | POA: Diagnosis not present

## 2022-12-02 DIAGNOSIS — N1832 Chronic kidney disease, stage 3b: Secondary | ICD-10-CM | POA: Diagnosis not present

## 2022-12-02 DIAGNOSIS — E0520 Thyrotoxicosis with toxic multinodular goiter without thyrotoxic crisis or storm: Secondary | ICD-10-CM | POA: Diagnosis not present

## 2022-12-02 DIAGNOSIS — E559 Vitamin D deficiency, unspecified: Secondary | ICD-10-CM | POA: Diagnosis not present

## 2022-12-02 DIAGNOSIS — I700 Atherosclerosis of aorta: Secondary | ICD-10-CM | POA: Diagnosis not present

## 2022-12-02 DIAGNOSIS — E669 Obesity, unspecified: Secondary | ICD-10-CM | POA: Diagnosis not present

## 2022-12-02 DIAGNOSIS — Z6830 Body mass index (BMI) 30.0-30.9, adult: Secondary | ICD-10-CM | POA: Diagnosis not present

## 2022-12-04 ENCOUNTER — Encounter: Payer: Self-pay | Admitting: Hematology and Oncology

## 2022-12-20 NOTE — Progress Notes (Signed)
Patient Care Team: Sigmund Hazel, MD as PCP - General (Family Medicine) Serena Croissant, MD as Consulting Physician (Hematology and Oncology) Axel Filler, Larna Daughters, NP as Nurse Practitioner (Hematology and Oncology) Dorothy Puffer, MD as Consulting Physician (Radiation Oncology) Griselda Miner, MD as Consulting Physician (General Surgery) Maxie Barb, MD as Consulting Physician (Nephrology)  DIAGNOSIS: No diagnosis found.  SUMMARY OF ONCOLOGIC HISTORY: Oncology History  Breast cancer of upper-outer quadrant of left female breast (HCC)  01/02/2016 Initial Biopsy   Left breast biopsy 1:00: Ductal papilloma with [facility]   02/27/2016 Surgery   Left lumpectomy: IDC with papillary features, grade 2, 1.1 cm, ADH, superior/medial margin positive, ER 95%, PR 95%, Ki-67 5%, HER-2 negative ratio 1.13, T1c N0 stage IA   03/12/2016 Surgery   Left superior and medial margin reexcision: Negative for malignancy, 0/2 lymph nodes; Oncotype Dx 0 (2% ROR)    04/21/2016 - 05/18/2016 Radiation Therapy   Adj XRT Mitzi Hansen): The patient initially received a dose of 42.5 Gy in 17 fractions to the breast using whole-breast tangent fields. This was delivered using a 3-D conformal technique. The patient then received a boost to the seroma. This delivered an additional 7.5 Gy in 3 fractions using a 3 field photon technique due to the depth of the seroma. The total dose was 50 Gy.   06/29/2016 - 06/28/2021 Anti-estrogen oral therapy   Anastrozole 1 mg daily     CHIEF COMPLIANT:   INTERVAL HISTORY: [name] is a   ALLERGIES:  is allergic to amlodipine besy-benazepril hcl, atorvastatin, hydrocodone-acetaminophen, and lisinopril.  MEDICATIONS:  Current Outpatient Medications  Medication Sig Dispense Refill   acetaminophen (TYLENOL) 500 MG tablet Take 500 mg by mouth daily as needed for headache.     amLODipine (NORVASC) 10 MG tablet Take 1 tablet (10 mg total) by mouth daily.     carvedilol (COREG) 3.125  MG tablet Take 3.125 mg by mouth 2 (two) times daily with a meal.     Cholecalciferol (VITAMIN D3) 2000 units capsule Take 2,000 Units by mouth daily.      losartan (COZAAR) 50 MG tablet Take 100 mg by mouth daily.     Multiple Vitamin (MULTIVITAMIN WITH MINERALS) TABS tablet Take 1 tablet by mouth daily.     No current facility-administered medications for this visit.    PHYSICAL EXAMINATION: ECOG PERFORMANCE STATUS: {CHL ONC ECOG PS:[id number]}  There were no vitals filed for this visit. There were no vitals filed for this visit.  BREAST:*** No palpable masses or nodules in either right or left breasts. No palpable axillary supraclavicular or infraclavicular adenopathy no breast tenderness or nipple discharge. (exam performed in the presence of a chaperone)  LABORATORY DATA:  I have reviewed the data as listed    Latest Ref Rng & Units 02/20/2016    8:37 AM 10/06/2014    6:00 AM 10/03/2014    5:07 AM  CMP  Glucose 65 - 99 mg/dL 98  161  096   BUN 6 - 20 mg/dL 17  9  17    Creatinine 0.44 - 1.00 mg/dL 0.45  4.09  8.11   Sodium 135 - 145 mmol/L 139  140  148   Potassium 3.5 - 5.1 mmol/L 3.8  3.5  4.0   Chloride 101 - 111 mmol/L 105  113  113   CO2 22 - 32 mmol/L 27  23  29    Calcium 8.9 - 10.3 mg/dL 9.1  8.2  8.8     Lab  Results  Component Value Date   WBC 6.4 02/20/2016   HGB 13.2 02/20/2016   HCT 41.1 02/20/2016   MCV 88.0 02/20/2016   PLT 356 02/20/2016   NEUTROABS 8.9 (H) 10/02/2014    ASSESSMENT & PLAN:  No problem-specific Assessment & Plan notes found for this encounter.    No orders of the defined types were placed in this encounter.  The patient has a good understanding of the overall plan. she agrees with it. she will call with any problems that may develop before the next visit here. Total time spent: 30 mins including face to face time and time spent for planning, charting and co-ordination of care   Sherlyn Lick, CMA 12/20/22    I [name] am acting as a Neurosurgeon for [employer]  ***

## 2022-12-24 ENCOUNTER — Inpatient Hospital Stay: Payer: Medicare PPO | Attending: Hematology and Oncology | Admitting: Hematology and Oncology

## 2022-12-24 ENCOUNTER — Other Ambulatory Visit: Payer: Self-pay

## 2022-12-24 VITALS — BP 143/78 | HR 67 | Temp 97.7°F | Resp 18 | Ht 68.0 in | Wt 187.6 lb

## 2022-12-24 DIAGNOSIS — Z79811 Long term (current) use of aromatase inhibitors: Secondary | ICD-10-CM | POA: Insufficient documentation

## 2022-12-24 DIAGNOSIS — Z923 Personal history of irradiation: Secondary | ICD-10-CM | POA: Diagnosis not present

## 2022-12-24 DIAGNOSIS — H43813 Vitreous degeneration, bilateral: Secondary | ICD-10-CM | POA: Diagnosis not present

## 2022-12-24 DIAGNOSIS — C50412 Malignant neoplasm of upper-outer quadrant of left female breast: Secondary | ICD-10-CM | POA: Diagnosis not present

## 2022-12-24 DIAGNOSIS — Z170 Estrogen receptor positive status [ER+]: Secondary | ICD-10-CM | POA: Insufficient documentation

## 2022-12-24 DIAGNOSIS — H59033 Cystoid macular edema following cataract surgery, bilateral: Secondary | ICD-10-CM | POA: Diagnosis not present

## 2022-12-24 DIAGNOSIS — H35033 Hypertensive retinopathy, bilateral: Secondary | ICD-10-CM | POA: Diagnosis not present

## 2022-12-24 DIAGNOSIS — H35373 Puckering of macula, bilateral: Secondary | ICD-10-CM | POA: Diagnosis not present

## 2022-12-24 NOTE — Assessment & Plan Note (Addendum)
Left lumpectomy 02/27/2016: IDC with papillary features, grade 2, 1.1 cm, ADH, superior/medial margin positive, ER 95%, PR 95%, Ki-67 5%, HER-2 negative ratio 1.13,  Margin re-excision 03/12/2016: Negative for cancer, 0/2 lymph nodes negative Pathologic staging: T1c N0 stage IA Oncotype Dx score 0: 2% ROR Adj XRT 04/21/16 to 05/15/16   Treatment Plan: Adjuvant therapy with Anastrozole 1 mg daily X 5 years started 06/29/2016 completed December 2022      Breast Cancer Surveillance: Mammogram 01/23/2022: Benign calcifications suspicious for fat necrosis left breast lumpectomy site, breast density category B  CT chest 03/06/2022: Pulmonary nodules measuring 5 mm or less in size and stable compared to 07/30/2021.  Favor benign lesions recommend continued attention on follow-up. Breast exam 12/24/2022: Benign  Return to clinic on an as-needed basis.

## 2022-12-25 ENCOUNTER — Other Ambulatory Visit: Payer: Self-pay | Admitting: Family Medicine

## 2022-12-25 DIAGNOSIS — R921 Mammographic calcification found on diagnostic imaging of breast: Secondary | ICD-10-CM

## 2023-01-06 DIAGNOSIS — K5730 Diverticulosis of large intestine without perforation or abscess without bleeding: Secondary | ICD-10-CM | POA: Diagnosis not present

## 2023-01-06 DIAGNOSIS — Z86010 Personal history of colonic polyps: Secondary | ICD-10-CM | POA: Diagnosis not present

## 2023-01-06 DIAGNOSIS — D125 Benign neoplasm of sigmoid colon: Secondary | ICD-10-CM | POA: Diagnosis not present

## 2023-01-06 DIAGNOSIS — D124 Benign neoplasm of descending colon: Secondary | ICD-10-CM | POA: Diagnosis not present

## 2023-01-06 DIAGNOSIS — Z09 Encounter for follow-up examination after completed treatment for conditions other than malignant neoplasm: Secondary | ICD-10-CM | POA: Diagnosis not present

## 2023-01-06 DIAGNOSIS — Z980 Intestinal bypass and anastomosis status: Secondary | ICD-10-CM | POA: Diagnosis not present

## 2023-01-08 DIAGNOSIS — D125 Benign neoplasm of sigmoid colon: Secondary | ICD-10-CM | POA: Diagnosis not present

## 2023-01-08 DIAGNOSIS — D124 Benign neoplasm of descending colon: Secondary | ICD-10-CM | POA: Diagnosis not present

## 2023-01-19 DIAGNOSIS — I129 Hypertensive chronic kidney disease with stage 1 through stage 4 chronic kidney disease, or unspecified chronic kidney disease: Secondary | ICD-10-CM | POA: Diagnosis not present

## 2023-01-19 DIAGNOSIS — N1832 Chronic kidney disease, stage 3b: Secondary | ICD-10-CM | POA: Diagnosis not present

## 2023-01-19 DIAGNOSIS — C50919 Malignant neoplasm of unspecified site of unspecified female breast: Secondary | ICD-10-CM | POA: Diagnosis not present

## 2023-01-19 DIAGNOSIS — D631 Anemia in chronic kidney disease: Secondary | ICD-10-CM | POA: Diagnosis not present

## 2023-01-19 DIAGNOSIS — E559 Vitamin D deficiency, unspecified: Secondary | ICD-10-CM | POA: Diagnosis not present

## 2023-01-25 ENCOUNTER — Other Ambulatory Visit: Payer: Self-pay | Admitting: Family Medicine

## 2023-01-25 ENCOUNTER — Ambulatory Visit
Admission: RE | Admit: 2023-01-25 | Discharge: 2023-01-25 | Disposition: A | Discharge status: Home / Self Care | Payer: Medicare PPO | Source: Ambulatory Visit | Attending: Family Medicine | Admitting: Family Medicine

## 2023-01-25 DIAGNOSIS — R921 Mammographic calcification found on diagnostic imaging of breast: Secondary | ICD-10-CM

## 2023-03-03 DIAGNOSIS — H18413 Arcus senilis, bilateral: Secondary | ICD-10-CM | POA: Diagnosis not present

## 2023-03-03 DIAGNOSIS — Z961 Presence of intraocular lens: Secondary | ICD-10-CM | POA: Diagnosis not present

## 2023-03-03 DIAGNOSIS — H11153 Pinguecula, bilateral: Secondary | ICD-10-CM | POA: Diagnosis not present

## 2023-03-12 DIAGNOSIS — H44111 Panuveitis, right eye: Secondary | ICD-10-CM | POA: Diagnosis not present

## 2023-03-19 DIAGNOSIS — H44111 Panuveitis, right eye: Secondary | ICD-10-CM | POA: Diagnosis not present

## 2023-04-01 DIAGNOSIS — H43813 Vitreous degeneration, bilateral: Secondary | ICD-10-CM | POA: Diagnosis not present

## 2023-04-01 DIAGNOSIS — H35373 Puckering of macula, bilateral: Secondary | ICD-10-CM | POA: Diagnosis not present

## 2023-04-01 DIAGNOSIS — H35033 Hypertensive retinopathy, bilateral: Secondary | ICD-10-CM | POA: Diagnosis not present

## 2023-04-01 DIAGNOSIS — H59033 Cystoid macular edema following cataract surgery, bilateral: Secondary | ICD-10-CM | POA: Diagnosis not present

## 2023-05-04 DIAGNOSIS — E785 Hyperlipidemia, unspecified: Secondary | ICD-10-CM | POA: Diagnosis not present

## 2023-05-04 DIAGNOSIS — Z8249 Family history of ischemic heart disease and other diseases of the circulatory system: Secondary | ICD-10-CM | POA: Diagnosis not present

## 2023-05-04 DIAGNOSIS — N1832 Chronic kidney disease, stage 3b: Secondary | ICD-10-CM | POA: Diagnosis not present

## 2023-05-04 DIAGNOSIS — Z87440 Personal history of urinary (tract) infections: Secondary | ICD-10-CM | POA: Diagnosis not present

## 2023-05-04 DIAGNOSIS — Z853 Personal history of malignant neoplasm of breast: Secondary | ICD-10-CM | POA: Diagnosis not present

## 2023-05-04 DIAGNOSIS — Z87892 Personal history of anaphylaxis: Secondary | ICD-10-CM | POA: Diagnosis not present

## 2023-05-04 DIAGNOSIS — I700 Atherosclerosis of aorta: Secondary | ICD-10-CM | POA: Diagnosis not present

## 2023-05-04 DIAGNOSIS — Z888 Allergy status to other drugs, medicaments and biological substances status: Secondary | ICD-10-CM | POA: Diagnosis not present

## 2023-05-04 DIAGNOSIS — I129 Hypertensive chronic kidney disease with stage 1 through stage 4 chronic kidney disease, or unspecified chronic kidney disease: Secondary | ICD-10-CM | POA: Diagnosis not present

## 2023-06-04 DIAGNOSIS — Z853 Personal history of malignant neoplasm of breast: Secondary | ICD-10-CM | POA: Diagnosis not present

## 2023-06-04 DIAGNOSIS — N1832 Chronic kidney disease, stage 3b: Secondary | ICD-10-CM | POA: Diagnosis not present

## 2023-06-04 DIAGNOSIS — Z6829 Body mass index (BMI) 29.0-29.9, adult: Secondary | ICD-10-CM | POA: Diagnosis not present

## 2023-06-04 DIAGNOSIS — Z8639 Personal history of other endocrine, nutritional and metabolic disease: Secondary | ICD-10-CM | POA: Diagnosis not present

## 2023-06-04 DIAGNOSIS — E663 Overweight: Secondary | ICD-10-CM | POA: Diagnosis not present

## 2023-06-04 DIAGNOSIS — I129 Hypertensive chronic kidney disease with stage 1 through stage 4 chronic kidney disease, or unspecified chronic kidney disease: Secondary | ICD-10-CM | POA: Diagnosis not present

## 2023-06-04 DIAGNOSIS — Z23 Encounter for immunization: Secondary | ICD-10-CM | POA: Diagnosis not present

## 2023-06-04 DIAGNOSIS — E7800 Pure hypercholesterolemia, unspecified: Secondary | ICD-10-CM | POA: Diagnosis not present

## 2023-06-04 DIAGNOSIS — Z1329 Encounter for screening for other suspected endocrine disorder: Secondary | ICD-10-CM | POA: Diagnosis not present

## 2023-06-14 DIAGNOSIS — H43813 Vitreous degeneration, bilateral: Secondary | ICD-10-CM | POA: Diagnosis not present

## 2023-06-14 DIAGNOSIS — H35033 Hypertensive retinopathy, bilateral: Secondary | ICD-10-CM | POA: Diagnosis not present

## 2023-06-14 DIAGNOSIS — H59033 Cystoid macular edema following cataract surgery, bilateral: Secondary | ICD-10-CM | POA: Diagnosis not present

## 2023-06-14 DIAGNOSIS — H3023 Posterior cyclitis, bilateral: Secondary | ICD-10-CM | POA: Diagnosis not present

## 2023-06-14 DIAGNOSIS — H35373 Puckering of macula, bilateral: Secondary | ICD-10-CM | POA: Diagnosis not present

## 2023-07-20 DIAGNOSIS — N1832 Chronic kidney disease, stage 3b: Secondary | ICD-10-CM | POA: Diagnosis not present

## 2023-07-26 DIAGNOSIS — R809 Proteinuria, unspecified: Secondary | ICD-10-CM | POA: Diagnosis not present

## 2023-07-26 DIAGNOSIS — N1832 Chronic kidney disease, stage 3b: Secondary | ICD-10-CM | POA: Diagnosis not present

## 2023-07-26 DIAGNOSIS — C50919 Malignant neoplasm of unspecified site of unspecified female breast: Secondary | ICD-10-CM | POA: Diagnosis not present

## 2023-07-26 DIAGNOSIS — I129 Hypertensive chronic kidney disease with stage 1 through stage 4 chronic kidney disease, or unspecified chronic kidney disease: Secondary | ICD-10-CM | POA: Diagnosis not present

## 2023-07-26 DIAGNOSIS — E559 Vitamin D deficiency, unspecified: Secondary | ICD-10-CM | POA: Diagnosis not present

## 2023-07-26 DIAGNOSIS — N189 Chronic kidney disease, unspecified: Secondary | ICD-10-CM | POA: Diagnosis not present

## 2023-07-26 DIAGNOSIS — D631 Anemia in chronic kidney disease: Secondary | ICD-10-CM | POA: Diagnosis not present

## 2023-07-29 DIAGNOSIS — H59031 Cystoid macular edema following cataract surgery, right eye: Secondary | ICD-10-CM | POA: Diagnosis not present

## 2023-07-29 DIAGNOSIS — H3023 Posterior cyclitis, bilateral: Secondary | ICD-10-CM | POA: Diagnosis not present

## 2023-08-03 DIAGNOSIS — H40013 Open angle with borderline findings, low risk, bilateral: Secondary | ICD-10-CM | POA: Diagnosis not present

## 2023-08-26 DIAGNOSIS — H59033 Cystoid macular edema following cataract surgery, bilateral: Secondary | ICD-10-CM | POA: Diagnosis not present

## 2023-08-26 DIAGNOSIS — H43813 Vitreous degeneration, bilateral: Secondary | ICD-10-CM | POA: Diagnosis not present

## 2023-08-26 DIAGNOSIS — H3023 Posterior cyclitis, bilateral: Secondary | ICD-10-CM | POA: Diagnosis not present

## 2023-08-26 DIAGNOSIS — H35033 Hypertensive retinopathy, bilateral: Secondary | ICD-10-CM | POA: Diagnosis not present

## 2023-08-26 DIAGNOSIS — H35373 Puckering of macula, bilateral: Secondary | ICD-10-CM | POA: Diagnosis not present

## 2023-10-07 DIAGNOSIS — H3021 Posterior cyclitis, right eye: Secondary | ICD-10-CM | POA: Diagnosis not present

## 2023-10-07 DIAGNOSIS — H59031 Cystoid macular edema following cataract surgery, right eye: Secondary | ICD-10-CM | POA: Diagnosis not present

## 2023-11-10 DIAGNOSIS — H3022 Posterior cyclitis, left eye: Secondary | ICD-10-CM | POA: Diagnosis not present

## 2023-11-10 DIAGNOSIS — H59032 Cystoid macular edema following cataract surgery, left eye: Secondary | ICD-10-CM | POA: Diagnosis not present

## 2023-11-10 DIAGNOSIS — H35373 Puckering of macula, bilateral: Secondary | ICD-10-CM | POA: Diagnosis not present

## 2023-11-10 DIAGNOSIS — H35033 Hypertensive retinopathy, bilateral: Secondary | ICD-10-CM | POA: Diagnosis not present

## 2023-11-10 DIAGNOSIS — H43813 Vitreous degeneration, bilateral: Secondary | ICD-10-CM | POA: Diagnosis not present

## 2023-11-11 DIAGNOSIS — Z853 Personal history of malignant neoplasm of breast: Secondary | ICD-10-CM | POA: Diagnosis not present

## 2023-11-11 DIAGNOSIS — Z0000 Encounter for general adult medical examination without abnormal findings: Secondary | ICD-10-CM | POA: Diagnosis not present

## 2023-11-11 DIAGNOSIS — E7800 Pure hypercholesterolemia, unspecified: Secondary | ICD-10-CM | POA: Diagnosis not present

## 2023-11-11 DIAGNOSIS — I129 Hypertensive chronic kidney disease with stage 1 through stage 4 chronic kidney disease, or unspecified chronic kidney disease: Secondary | ICD-10-CM | POA: Diagnosis not present

## 2023-11-11 DIAGNOSIS — E559 Vitamin D deficiency, unspecified: Secondary | ICD-10-CM | POA: Diagnosis not present

## 2023-11-11 DIAGNOSIS — Z6830 Body mass index (BMI) 30.0-30.9, adult: Secondary | ICD-10-CM | POA: Diagnosis not present

## 2023-11-11 DIAGNOSIS — N1832 Chronic kidney disease, stage 3b: Secondary | ICD-10-CM | POA: Diagnosis not present

## 2023-11-11 DIAGNOSIS — Z1331 Encounter for screening for depression: Secondary | ICD-10-CM | POA: Diagnosis not present

## 2023-11-11 DIAGNOSIS — Z23 Encounter for immunization: Secondary | ICD-10-CM | POA: Diagnosis not present

## 2023-12-02 DIAGNOSIS — H40013 Open angle with borderline findings, low risk, bilateral: Secondary | ICD-10-CM | POA: Diagnosis not present

## 2023-12-13 ENCOUNTER — Other Ambulatory Visit: Payer: Self-pay | Admitting: Family Medicine

## 2023-12-13 DIAGNOSIS — Z1231 Encounter for screening mammogram for malignant neoplasm of breast: Secondary | ICD-10-CM

## 2024-01-10 DIAGNOSIS — H3023 Posterior cyclitis, bilateral: Secondary | ICD-10-CM | POA: Diagnosis not present

## 2024-01-10 DIAGNOSIS — H35033 Hypertensive retinopathy, bilateral: Secondary | ICD-10-CM | POA: Diagnosis not present

## 2024-01-10 DIAGNOSIS — H43813 Vitreous degeneration, bilateral: Secondary | ICD-10-CM | POA: Diagnosis not present

## 2024-01-10 DIAGNOSIS — H35373 Puckering of macula, bilateral: Secondary | ICD-10-CM | POA: Diagnosis not present

## 2024-01-10 DIAGNOSIS — H59033 Cystoid macular edema following cataract surgery, bilateral: Secondary | ICD-10-CM | POA: Diagnosis not present

## 2024-01-20 DIAGNOSIS — N1832 Chronic kidney disease, stage 3b: Secondary | ICD-10-CM | POA: Diagnosis not present

## 2024-01-25 DIAGNOSIS — E559 Vitamin D deficiency, unspecified: Secondary | ICD-10-CM | POA: Diagnosis not present

## 2024-01-25 DIAGNOSIS — N1832 Chronic kidney disease, stage 3b: Secondary | ICD-10-CM | POA: Diagnosis not present

## 2024-01-25 DIAGNOSIS — D631 Anemia in chronic kidney disease: Secondary | ICD-10-CM | POA: Diagnosis not present

## 2024-01-25 DIAGNOSIS — R809 Proteinuria, unspecified: Secondary | ICD-10-CM | POA: Diagnosis not present

## 2024-01-25 DIAGNOSIS — C50919 Malignant neoplasm of unspecified site of unspecified female breast: Secondary | ICD-10-CM | POA: Diagnosis not present

## 2024-01-25 DIAGNOSIS — N2581 Secondary hyperparathyroidism of renal origin: Secondary | ICD-10-CM | POA: Diagnosis not present

## 2024-01-25 DIAGNOSIS — I129 Hypertensive chronic kidney disease with stage 1 through stage 4 chronic kidney disease, or unspecified chronic kidney disease: Secondary | ICD-10-CM | POA: Diagnosis not present

## 2024-01-26 ENCOUNTER — Ambulatory Visit
Admission: RE | Admit: 2024-01-26 | Discharge: 2024-01-26 | Disposition: A | Discharge status: Home / Self Care | Source: Ambulatory Visit | Attending: Family Medicine | Admitting: Family Medicine

## 2024-01-26 DIAGNOSIS — Z1231 Encounter for screening mammogram for malignant neoplasm of breast: Secondary | ICD-10-CM

## 2024-01-31 ENCOUNTER — Other Ambulatory Visit: Payer: Self-pay | Admitting: Family Medicine

## 2024-01-31 DIAGNOSIS — R928 Other abnormal and inconclusive findings on diagnostic imaging of breast: Secondary | ICD-10-CM

## 2024-02-03 ENCOUNTER — Other Ambulatory Visit: Payer: Self-pay | Admitting: Family Medicine

## 2024-02-03 ENCOUNTER — Ambulatory Visit
Admission: RE | Admit: 2024-02-03 | Discharge: 2024-02-03 | Disposition: A | Discharge status: Home / Self Care | Source: Ambulatory Visit | Attending: Family Medicine | Admitting: Family Medicine

## 2024-02-03 DIAGNOSIS — R928 Other abnormal and inconclusive findings on diagnostic imaging of breast: Secondary | ICD-10-CM

## 2024-02-03 DIAGNOSIS — R921 Mammographic calcification found on diagnostic imaging of breast: Secondary | ICD-10-CM

## 2024-02-07 ENCOUNTER — Ambulatory Visit
Admission: RE | Admit: 2024-02-07 | Discharge: 2024-02-07 | Disposition: A | Discharge status: Home / Self Care | Source: Ambulatory Visit | Attending: Family Medicine | Admitting: Family Medicine

## 2024-02-07 DIAGNOSIS — R928 Other abnormal and inconclusive findings on diagnostic imaging of breast: Secondary | ICD-10-CM

## 2024-02-07 DIAGNOSIS — N6489 Other specified disorders of breast: Secondary | ICD-10-CM | POA: Diagnosis not present

## 2024-02-07 DIAGNOSIS — R921 Mammographic calcification found on diagnostic imaging of breast: Secondary | ICD-10-CM

## 2024-02-07 HISTORY — PX: BREAST BIOPSY: SHX20

## 2024-02-08 LAB — SURGICAL PATHOLOGY

## 2024-02-17 DIAGNOSIS — H59032 Cystoid macular edema following cataract surgery, left eye: Secondary | ICD-10-CM | POA: Diagnosis not present

## 2024-02-17 DIAGNOSIS — H59033 Cystoid macular edema following cataract surgery, bilateral: Secondary | ICD-10-CM | POA: Diagnosis not present

## 2024-02-17 DIAGNOSIS — H3023 Posterior cyclitis, bilateral: Secondary | ICD-10-CM | POA: Diagnosis not present

## 2024-03-09 DIAGNOSIS — E049 Nontoxic goiter, unspecified: Secondary | ICD-10-CM | POA: Diagnosis not present

## 2024-03-09 DIAGNOSIS — N1832 Chronic kidney disease, stage 3b: Secondary | ICD-10-CM | POA: Diagnosis not present

## 2024-03-09 DIAGNOSIS — E559 Vitamin D deficiency, unspecified: Secondary | ICD-10-CM | POA: Diagnosis not present

## 2024-03-09 DIAGNOSIS — I700 Atherosclerosis of aorta: Secondary | ICD-10-CM | POA: Diagnosis not present

## 2024-03-09 DIAGNOSIS — Z87892 Personal history of anaphylaxis: Secondary | ICD-10-CM | POA: Diagnosis not present

## 2024-03-09 DIAGNOSIS — I129 Hypertensive chronic kidney disease with stage 1 through stage 4 chronic kidney disease, or unspecified chronic kidney disease: Secondary | ICD-10-CM | POA: Diagnosis not present

## 2024-03-09 DIAGNOSIS — Z853 Personal history of malignant neoplasm of breast: Secondary | ICD-10-CM | POA: Diagnosis not present

## 2024-03-09 DIAGNOSIS — Z8249 Family history of ischemic heart disease and other diseases of the circulatory system: Secondary | ICD-10-CM | POA: Diagnosis not present

## 2024-04-13 DIAGNOSIS — H43813 Vitreous degeneration, bilateral: Secondary | ICD-10-CM | POA: Diagnosis not present

## 2024-04-13 DIAGNOSIS — H59031 Cystoid macular edema following cataract surgery, right eye: Secondary | ICD-10-CM | POA: Diagnosis not present

## 2024-04-13 DIAGNOSIS — H3023 Posterior cyclitis, bilateral: Secondary | ICD-10-CM | POA: Diagnosis not present

## 2024-04-13 DIAGNOSIS — H35033 Hypertensive retinopathy, bilateral: Secondary | ICD-10-CM | POA: Diagnosis not present

## 2024-04-13 DIAGNOSIS — H35373 Puckering of macula, bilateral: Secondary | ICD-10-CM | POA: Diagnosis not present

## 2024-05-04 DIAGNOSIS — H3022 Posterior cyclitis, left eye: Secondary | ICD-10-CM | POA: Diagnosis not present

## 2024-05-18 DIAGNOSIS — N1832 Chronic kidney disease, stage 3b: Secondary | ICD-10-CM | POA: Diagnosis not present

## 2024-05-18 DIAGNOSIS — Z23 Encounter for immunization: Secondary | ICD-10-CM | POA: Diagnosis not present

## 2024-05-18 DIAGNOSIS — Z6828 Body mass index (BMI) 28.0-28.9, adult: Secondary | ICD-10-CM | POA: Diagnosis not present

## 2024-05-18 DIAGNOSIS — E7800 Pure hypercholesterolemia, unspecified: Secondary | ICD-10-CM | POA: Diagnosis not present

## 2024-05-18 DIAGNOSIS — I129 Hypertensive chronic kidney disease with stage 1 through stage 4 chronic kidney disease, or unspecified chronic kidney disease: Secondary | ICD-10-CM | POA: Diagnosis not present
